# Patient Record
Sex: Female | Born: 1960
Health system: Southern US, Community
[De-identification: ages and names within clinical notes are randomized; demographics above are authoritative.]

## PROBLEM LIST (undated history)

## (undated) DIAGNOSIS — D329 Benign neoplasm of meninges, unspecified: Secondary | ICD-10-CM

## (undated) DIAGNOSIS — E7800 Pure hypercholesterolemia, unspecified: Secondary | ICD-10-CM

## (undated) DIAGNOSIS — E559 Vitamin D deficiency, unspecified: Secondary | ICD-10-CM

## (undated) DIAGNOSIS — R9409 Abnormal results of other function studies of central nervous system: Secondary | ICD-10-CM

## (undated) DIAGNOSIS — IMO0002 Reserved for concepts with insufficient information to code with codable children: Secondary | ICD-10-CM

## (undated) DIAGNOSIS — I10 Essential (primary) hypertension: Secondary | ICD-10-CM

## (undated) DIAGNOSIS — M8580 Other specified disorders of bone density and structure, unspecified site: Secondary | ICD-10-CM

## (undated) DIAGNOSIS — R030 Elevated blood-pressure reading, without diagnosis of hypertension: Secondary | ICD-10-CM

## (undated) DIAGNOSIS — J019 Acute sinusitis, unspecified: Secondary | ICD-10-CM

## (undated) DIAGNOSIS — F429 Obsessive-compulsive disorder, unspecified: Secondary | ICD-10-CM

## (undated) DIAGNOSIS — D352 Benign neoplasm of pituitary gland: Secondary | ICD-10-CM

## (undated) HISTORY — DX: Obsessive-compulsive disorder, unspecified: F42.9

## (undated) HISTORY — DX: Reserved for concepts with insufficient information to code with codable children: IMO0002

## (undated) HISTORY — DX: Acute sinusitis, unspecified: J01.90

## (undated) HISTORY — DX: Benign neoplasm of pituitary gland: D35.2

## (undated) HISTORY — DX: Pure hypercholesterolemia, unspecified: E78.00

## (undated) HISTORY — DX: Vitamin D deficiency, unspecified: E55.9

## (undated) HISTORY — DX: Benign neoplasm of meninges, unspecified: D32.9

## (undated) HISTORY — DX: Abnormal results of other function studies of central nervous system: R94.09

## (undated) HISTORY — PX: COLONOSCOPY: SHX174

## (undated) HISTORY — PX: OTHER SURGICAL HISTORY: SHX169

## (undated) HISTORY — DX: Elevated blood-pressure reading, without diagnosis of hypertension: R03.0

## (undated) HISTORY — DX: Other specified disorders of bone density and structure, unspecified site: M85.80

## (undated) HISTORY — DX: Essential (primary) hypertension: I10

---

## 1998-08-27 ENCOUNTER — Encounter: Payer: Self-pay | Admitting: Cardiovascular Disease

## 1998-08-27 ENCOUNTER — Ambulatory Visit (HOSPITAL_COMMUNITY): Admission: RE | Admit: 1998-08-27 | Discharge: 1998-08-27 | Payer: Self-pay | Admitting: Cardiovascular Disease

## 1998-09-17 ENCOUNTER — Other Ambulatory Visit: Admission: RE | Admit: 1998-09-17 | Discharge: 1998-09-17 | Payer: Self-pay | Admitting: Obstetrics & Gynecology

## 1999-08-30 ENCOUNTER — Ambulatory Visit (HOSPITAL_COMMUNITY): Admission: RE | Admit: 1999-08-30 | Discharge: 1999-08-30 | Payer: Self-pay | Admitting: Cardiovascular Disease

## 1999-08-30 ENCOUNTER — Encounter: Payer: Self-pay | Admitting: Cardiovascular Disease

## 2000-09-11 ENCOUNTER — Ambulatory Visit (HOSPITAL_COMMUNITY): Admission: RE | Admit: 2000-09-11 | Discharge: 2000-09-11 | Payer: Self-pay | Admitting: Cardiovascular Disease

## 2000-09-11 ENCOUNTER — Encounter: Payer: Self-pay | Admitting: Cardiovascular Disease

## 2001-07-27 ENCOUNTER — Other Ambulatory Visit: Admission: RE | Admit: 2001-07-27 | Discharge: 2001-07-27 | Payer: Self-pay | Admitting: Gynecology

## 2001-10-02 ENCOUNTER — Ambulatory Visit (HOSPITAL_COMMUNITY): Admission: RE | Admit: 2001-10-02 | Discharge: 2001-10-02 | Payer: Self-pay | Admitting: Cardiovascular Disease

## 2001-10-02 ENCOUNTER — Encounter: Payer: Self-pay | Admitting: Cardiovascular Disease

## 2002-08-30 ENCOUNTER — Other Ambulatory Visit: Admission: RE | Admit: 2002-08-30 | Discharge: 2002-08-30 | Payer: Self-pay | Admitting: Gynecology

## 2002-10-07 ENCOUNTER — Ambulatory Visit (HOSPITAL_COMMUNITY): Admission: RE | Admit: 2002-10-07 | Discharge: 2002-10-07 | Payer: Self-pay | Admitting: Gynecology

## 2002-10-07 ENCOUNTER — Encounter: Payer: Self-pay | Admitting: Gynecology

## 2003-04-25 ENCOUNTER — Encounter (INDEPENDENT_AMBULATORY_CARE_PROVIDER_SITE_OTHER): Payer: Self-pay | Admitting: Specialist

## 2003-04-25 ENCOUNTER — Ambulatory Visit (HOSPITAL_BASED_OUTPATIENT_CLINIC_OR_DEPARTMENT_OTHER): Admission: RE | Admit: 2003-04-25 | Discharge: 2003-04-25 | Payer: Self-pay | Admitting: Gynecology

## 2003-08-02 HISTORY — PX: POLYPECTOMY: SHX149

## 2003-08-02 HISTORY — PX: TUBAL LIGATION: SHX77

## 2004-03-08 ENCOUNTER — Ambulatory Visit (HOSPITAL_COMMUNITY): Admission: RE | Admit: 2004-03-08 | Discharge: 2004-03-08 | Payer: Self-pay | Admitting: Cardiovascular Disease

## 2004-11-15 ENCOUNTER — Other Ambulatory Visit: Admission: RE | Admit: 2004-11-15 | Discharge: 2004-11-15 | Payer: Self-pay | Admitting: Gynecology

## 2005-03-22 ENCOUNTER — Ambulatory Visit (HOSPITAL_COMMUNITY): Admission: RE | Admit: 2005-03-22 | Discharge: 2005-03-22 | Payer: Self-pay | Admitting: Gynecology

## 2005-08-01 DIAGNOSIS — D352 Benign neoplasm of pituitary gland: Secondary | ICD-10-CM

## 2005-08-01 HISTORY — DX: Benign neoplasm of pituitary gland: D35.2

## 2005-12-15 ENCOUNTER — Other Ambulatory Visit: Admission: RE | Admit: 2005-12-15 | Discharge: 2005-12-15 | Payer: Self-pay | Admitting: Gynecology

## 2006-04-10 ENCOUNTER — Ambulatory Visit (HOSPITAL_COMMUNITY): Admission: RE | Admit: 2006-04-10 | Discharge: 2006-04-10 | Payer: Self-pay | Admitting: Gynecology

## 2006-12-19 ENCOUNTER — Other Ambulatory Visit: Admission: RE | Admit: 2006-12-19 | Discharge: 2006-12-19 | Payer: Self-pay | Admitting: Gynecology

## 2007-05-15 ENCOUNTER — Ambulatory Visit (HOSPITAL_COMMUNITY): Admission: RE | Admit: 2007-05-15 | Discharge: 2007-05-15 | Payer: Self-pay | Admitting: Gynecology

## 2007-12-21 ENCOUNTER — Other Ambulatory Visit: Admission: RE | Admit: 2007-12-21 | Discharge: 2007-12-21 | Payer: Self-pay | Admitting: Gynecology

## 2008-05-05 ENCOUNTER — Ambulatory Visit: Payer: Self-pay | Admitting: Internal Medicine

## 2008-05-05 DIAGNOSIS — R030 Elevated blood-pressure reading, without diagnosis of hypertension: Secondary | ICD-10-CM

## 2008-05-05 DIAGNOSIS — R9409 Abnormal results of other function studies of central nervous system: Secondary | ICD-10-CM | POA: Insufficient documentation

## 2008-05-05 HISTORY — DX: Elevated blood-pressure reading, without diagnosis of hypertension: R03.0

## 2008-05-05 HISTORY — DX: Abnormal results of other function studies of central nervous system: R94.09

## 2008-05-07 ENCOUNTER — Ambulatory Visit: Payer: Self-pay | Admitting: Internal Medicine

## 2008-05-07 LAB — CONVERTED CEMR LAB
Bilirubin Urine: NEGATIVE
Glucose, Urine, Semiquant: NEGATIVE
Ketones, urine, test strip: NEGATIVE
Protein, U semiquant: NEGATIVE
Urobilinogen, UA: 0.2
pH: 6

## 2008-05-08 ENCOUNTER — Encounter: Payer: Self-pay | Admitting: Internal Medicine

## 2008-05-08 LAB — CONVERTED CEMR LAB: Vit D, 1,25-Dihydroxy: 24 — ABNORMAL LOW (ref 30–89)

## 2008-05-12 ENCOUNTER — Telehealth: Payer: Self-pay | Admitting: *Deleted

## 2008-05-14 LAB — CONVERTED CEMR LAB
Albumin: 3.6 g/dL (ref 3.5–5.2)
Basophils Absolute: 0 10*3/uL (ref 0.0–0.1)
Basophils Relative: 0.3 % (ref 0.0–3.0)
Bilirubin, Direct: 0.1 mg/dL (ref 0.0–0.3)
Calcium: 8.9 mg/dL (ref 8.4–10.5)
Cholesterol: 207 mg/dL (ref 0–200)
Creatinine, Ser: 0.6 mg/dL (ref 0.4–1.2)
Eosinophils Absolute: 0.1 10*3/uL (ref 0.0–0.7)
Eosinophils Relative: 1.7 % (ref 0.0–5.0)
GFR calc Af Amer: 137 mL/min
GFR calc non Af Amer: 113 mL/min
MCHC: 33.8 g/dL (ref 30.0–36.0)
MCV: 87.9 fL (ref 78.0–100.0)
Monocytes Absolute: 0.7 10*3/uL (ref 0.1–1.0)
Potassium: 4.5 meq/L (ref 3.5–5.1)
RDW: 12.7 % (ref 11.5–14.6)
VLDL: 15 mg/dL (ref 0–40)

## 2008-07-09 ENCOUNTER — Ambulatory Visit (HOSPITAL_COMMUNITY): Admission: RE | Admit: 2008-07-09 | Discharge: 2008-07-09 | Payer: Self-pay | Admitting: Internal Medicine

## 2008-07-10 ENCOUNTER — Telehealth: Payer: Self-pay | Admitting: Internal Medicine

## 2008-07-10 ENCOUNTER — Ambulatory Visit: Payer: Self-pay | Admitting: Family Medicine

## 2008-10-27 ENCOUNTER — Telehealth: Payer: Self-pay | Admitting: Internal Medicine

## 2008-12-22 ENCOUNTER — Encounter: Payer: Self-pay | Admitting: Gynecology

## 2008-12-22 ENCOUNTER — Other Ambulatory Visit: Admission: RE | Admit: 2008-12-22 | Discharge: 2008-12-22 | Payer: Self-pay | Admitting: Gynecology

## 2008-12-22 ENCOUNTER — Ambulatory Visit: Payer: Self-pay | Admitting: Gynecology

## 2009-01-20 ENCOUNTER — Ambulatory Visit: Payer: Self-pay | Admitting: Gynecology

## 2009-02-20 ENCOUNTER — Ambulatory Visit: Payer: Self-pay | Admitting: Gynecology

## 2009-07-15 ENCOUNTER — Ambulatory Visit: Payer: Self-pay | Admitting: Gynecology

## 2009-07-23 ENCOUNTER — Ambulatory Visit (HOSPITAL_COMMUNITY): Admission: RE | Admit: 2009-07-23 | Discharge: 2009-07-23 | Payer: Self-pay | Admitting: Gynecology

## 2009-08-04 ENCOUNTER — Ambulatory Visit: Payer: Self-pay | Admitting: Internal Medicine

## 2009-08-04 DIAGNOSIS — J019 Acute sinusitis, unspecified: Secondary | ICD-10-CM

## 2009-08-04 HISTORY — DX: Acute sinusitis, unspecified: J01.90

## 2009-09-18 ENCOUNTER — Ambulatory Visit: Payer: Self-pay | Admitting: Internal Medicine

## 2009-10-09 ENCOUNTER — Ambulatory Visit: Payer: Self-pay | Admitting: Internal Medicine

## 2009-10-30 ENCOUNTER — Telehealth: Payer: Self-pay | Admitting: *Deleted

## 2010-01-16 ENCOUNTER — Ambulatory Visit: Payer: Self-pay | Admitting: Family Medicine

## 2010-01-18 ENCOUNTER — Telehealth (INDEPENDENT_AMBULATORY_CARE_PROVIDER_SITE_OTHER): Payer: Self-pay | Admitting: *Deleted

## 2010-01-26 ENCOUNTER — Ambulatory Visit: Payer: Self-pay | Admitting: Gynecology

## 2010-01-26 ENCOUNTER — Other Ambulatory Visit: Admission: RE | Admit: 2010-01-26 | Discharge: 2010-01-26 | Payer: Self-pay | Admitting: Gynecology

## 2010-02-04 ENCOUNTER — Ambulatory Visit: Payer: Self-pay | Admitting: Gynecology

## 2010-07-27 ENCOUNTER — Ambulatory Visit
Admission: RE | Admit: 2010-07-27 | Discharge: 2010-07-27 | Payer: Self-pay | Source: Home / Self Care | Attending: Internal Medicine | Admitting: Internal Medicine

## 2010-07-27 DIAGNOSIS — H73019 Bullous myringitis, unspecified ear: Secondary | ICD-10-CM | POA: Insufficient documentation

## 2010-08-03 ENCOUNTER — Ambulatory Visit (HOSPITAL_COMMUNITY)
Admission: RE | Admit: 2010-08-03 | Discharge: 2010-08-03 | Payer: Self-pay | Source: Home / Self Care | Attending: Gynecology | Admitting: Gynecology

## 2010-08-13 ENCOUNTER — Encounter: Payer: Self-pay | Admitting: Internal Medicine

## 2010-08-13 ENCOUNTER — Ambulatory Visit
Admission: RE | Admit: 2010-08-13 | Discharge: 2010-08-13 | Payer: Self-pay | Source: Home / Self Care | Attending: Internal Medicine | Admitting: Internal Medicine

## 2010-08-22 ENCOUNTER — Encounter: Payer: Self-pay | Admitting: Cardiovascular Disease

## 2010-09-02 NOTE — Progress Notes (Signed)
Summary: malaria tabs  Phone Note Call from Patient   Caller: Husband Summary of Call: Pt is going to [place] for 4 weeks and would like to have malarone tabs called into [facility].  Initial call taken by: Romualdo Bolk, CMA (AAMA),  October 30, 2009 11:47 AM  Follow-up for Phone Call        Malarone (disp 37) take daily beginning 2 days before travel and continue for 7 days after return. Follow-up by: Madelin Headings MD,  November 02, 2009 11:11 AM  Additional Follow-up for Phone Call Additional follow up Details #1::        Rx sent to pharmacy. Additional Follow-up by: Romualdo Bolk, CMA (AAMA),  November 02, 2009 11:48 AM    New/Updated Medications: MALARONE 250-100 MG TABS (ATOVAQUONE-PROGUANIL HCL) 1 once daily 2 days prior to travel and continue for 7 days after travel Prescriptions: MALARONE 250-100 MG TABS (ATOVAQUONE-PROGUANIL HCL) 1 once daily 2 days prior to travel and continue for 7 days after travel  #37 x 0   Entered by:   Romualdo Bolk, CMA (AAMA)   Authorized by:   Madelin Headings MD   Signed by:   Romualdo Bolk, CMA (AAMA) on 11/02/2009   Method used:   Electronically to        [facility]  [phone]* (retail)       [address] [place], [place]  [address]       Ph: [phone] or [phone]       Fax: [phone]   RxID:   [id number]

## 2010-09-02 NOTE — Assessment & Plan Note (Signed)
Summary: SORE THROAT,COUGH,CONGESTION,H/A // RS   Vital Signs:  Patient profile:   50 year old female Menstrual status:  perimenopausal LMP:     05/18/2009 Height:      58.25 inches Weight:      152 pounds BMI:     31.61 Temp:     98.5 degrees F oral Pulse rate:   66 / minute BP sitting:   140 / 80  (right arm) Cuff size:   regular  Vitals Entered By: Romualdo Bolk, CMA (AAMA) (August 04, 2009 11:03 AM) CC: sore throat, body aches, coughing, congestion started on 1/2 and started with no voice today. No sob or wheezing. Pt is having alot of sinus pressure. Pt was on a cruise in [place]. LMP (date): 05/18/2009     Menstrual Status perimenopausal Enter LMP: 05/18/2009   History of Present Illness: [name] comesin today for  sda   with husband   because of acute illness that began 3 days ago ..just returned from cruise in [place] and husband had similar illness resolving using decongestants and  german med? homeopathic? Since last visit  here  there have been no major changes in health status  .    last visit was  december 2009 .  Fever low grade at onset.  No chest pain and no SOB.  now head congestion and clogged ears  some cough .  No NVD.   Preventive Screening-Counseling & Management  Alcohol-Tobacco     Alcohol drinks/day: <1     Smoking Status: never  Caffeine-Diet-Exercise     Caffeine use/day: 2     Does Patient Exercise: no  Current Medications (verified): 1)  None  Allergies (verified): 1)  ! Asa  Past History:  Past medical, surgical, family and social histories (including risk factors) reviewed for relevance to current acute and chronic problems.  Past Medical History: Reviewed history from 05/05/2008 and no changes required. Unremarkable G2P2  remote hairline fracture  leg .  years ago.  Hepatitis  as a teen ? A   in [place]  ? if had Hep b  vaccine.   Chicken pox as a child   CONSULTANTS  Fernandex gyne Nudelman  NS   Past Surgical  History: Reviewed history from 05/05/2008 and no changes required. Tubal ligation C-Section x 2 ovarian cyst removal  ?  2005.    Past History:  Care Management: Gynecology: Beatrix Shipper  Family History: Reviewed history from 05/05/2008 and no changes required. parents 42 and 97  in [place].     mom has faultly valve .   sib siter  16  a and well  Social History: Reviewed history from 05/05/2008 and no changes required. 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    Physical Exam  General:  Well-developed,well-nourished,in no acute distress; alert,appropriate and cooperative throughout examination very congested  in nose  Head:  normocephalic, atraumatic, and no abnormalities observed.   Eyes:  vision grossly intact, pupils equal, and pupils round.   Ears:  tms lm normal  but pink  shiny  otherwise  Nose:  no external deformity and no external erythema.  3+ turbinates  Mouth:  pharynx pink and moist.   Neck:  No deformities, masses, or tenderness noted. Lungs:  Normal respiratory effort, chest expands symmetrically. Lungs are clear to auscultation, no crackles or wheezes.no dullness.   Heart:  Normal rate and regular rhythm. S1 and S2 normal without gallop, murmur, click, rub or other extra sounds. Cervical Nodes:  No lymphadenopathy noted Psych:  Oriented X3, not anxious appearing, and not depressed appearing.     Impression & Recommendations:  Problem # 1:  SINUSITIS - ACUTE-NOS (ICD-461.9) seems viral     at this point disc   course of   illness .  Expectant management and symptom  treatment.   Problem # 2:  URI (ICD-465.9) see above  The following medications were removed from the medication list:    Diclofenac Sodium 75 Mg Tbec (Diclofenac sodium) .Marland Kitchen... 1 two times a day  for inflammation  Patient Instructions: 1)  Mucinex D (  decongestant   medication) for congestion  2)  ACan use afrin generic nose spray at night for 3 days  to relieve the sinus pressure. 3)  Acute sinusitis symptoms for less than 10 days are not helped by antibiotics. Use warm moist compresses, and over the counter decongestants( only as directed). Call if no improvement in 5-7 days, sooner if increasing pain, fever, or new symptoms.  4)  Expect  illness to last  about a week but should be better after the weekend . 5)  Call if fever shortness of breath of severe pain.

## 2010-09-02 NOTE — Assessment & Plan Note (Signed)
Summary: NOT FEELING//CCM   Vital Signs:  Patient profile:   50 year old female Menstrual status:  perimenopausal Weight:      151 pounds Temp:     98.8 degrees F oral Pulse rate:   66 / minute BP sitting:   120 / 80  (right arm) Cuff size:   regular  Vitals Entered By: Romualdo Bolk, CMA (AAMA) (October 09, 2009 8:34 AM) CC: Pt is still got some coughing and congestion. But not has much as last time.   History of Present Illness: Ziyah Gillooly comein today for    follow up of prolonged respiratory problems  . LAst she was rx with antibiotic and nasal steroids . She is about 75 % better  but still has am cough with some yellow drainage.  onmaris has helped the snoring . No face pain HA fever or sob.    No reflux signs .  No HA or cp sob or wheezing.    feels well otherwise.  Preventive Screening-Counseling & Management  Alcohol-Tobacco     Alcohol drinks/day: <1     Smoking Status: never  Caffeine-Diet-Exercise     Caffeine use/day: 2     Does Patient Exercise: no  Current Medications (verified): 1)  Omnaris 50 Mcg/act Susp (Ciclesonide) .... 2 Sprays Each Nostril Each Day  Allergies (verified): 1)  ! Asa  Past History:  Past medical, surgical, family and social histories (including risk factors) reviewed for relevance to current acute and chronic problems.  Past Medical History: Reviewed history from 05/05/2008 and no changes required. Unremarkable G2P2  remote hairline fracture  leg .  years ago.  Hepatitis  as a teen ? A   in [place]  ? if had Hep b  vaccine.   Chicken pox as a child   CONSULTANTS  Fernandex gyne Nudelman  NS   Past Surgical History: Reviewed history from 05/05/2008 and no changes required. Tubal ligation C-Section x 2 ovarian cyst removal  ?  2005.    Past History:  Care Management: Gynecology: Beatrix Shipper  Family History: Reviewed history from 05/05/2008 and no changes required. parents 10 and 97  in [place].     mom has faultly  valve .   sib siter  41  a and well  Social History: Reviewed history from 05/05/2008 and no changes required. 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  The patient denies anorexia, fever, weight loss, weight gain, vision loss, decreased hearing, chest pain, syncope, dyspnea on exertion, peripheral edema, headaches, hemoptysis, abdominal pain, abnormal bleeding, enlarged lymph nodes, and angioedema.         ? some hoarseness at times  Physical Exam  General:  Well-developed,well-nourished,in no acute distress; alert,appropriate and cooperative throughout examination Head:  normocephalic, atraumatic, and no abnormalities observed.   Eyes:  vision grossly intact, pupils equal, and pupils round. no redness or discharge    Ears:  R ear normal and L ear normal.   Nose:  no external deformity, no external erythema, and no nasal discharge.  mild congestion faace non tender  Mouth:  pharynx pink and moist.   Neck:  No deformities, masses, or tenderness noted. Lungs:  Normal respiratory effort, chest expands symmetrically. Lungs  are clear to auscultation, no crackles or wheezes.no dullness.   Heart:  normal rate, regular rhythm, no rub, and no lifts.   Pulses:  nl cap refill  Neurologic:  alert & oriented X3 and gait normal.   Skin:  turgor normal, color normal, and no rashes.   Cervical Nodes:  No lymphadenopathy noted Psych:  Oriented X3, normally interactive, and good eye contact.     Impression & Recommendations:  Problem # 1:  COUGH (ICD-786.2) Assessment Improved still am  poss Post nasal drainage     or other  sinusitis   everything is much better however after nasal steroid and antibiotic   will add short course of steroid oral and  empiric prilosec for poss elr .    if not better  consider sinus ct    other antihistamine referral as appropiate.  Problem # 2:  UNSPECIFIED SINUSITIS (ICD-473.9) Assessment: Improved  The following medications were removed from the medication list:    Amoxicillin-pot Clavulanate 875-125 Mg Tabs (Amoxicillin-pot clavulanate) .Marland Kitchen... 1 by mouth two times a day for 10 days. Her updated medication list for this problem includes:    Omnaris 50 Mcg/act Susp (Ciclesonide) .Marland Kitchen... 2 sprays each nostril each day  Complete Medication List: 1)  Omnaris 50 Mcg/act Susp (Ciclesonide) .... 2 sprays each nostril each day 2)  Prednisone 20 Mg Tabs (Prednisone) .... 3 po for first day    then 2 po  each  day for 4 days or as directed  Patient Instructions: 1)  take a short trial of prednisone and begin prilosec 20 mg once daily over the counter  for the next 3 weeks  2)  continue the omnaris  nose spray. 3)  callin 1-2 weeks about how this is doing   4)  If not continuing to get better . Consider   sinus  check and other treatments.  Prescriptions: PREDNISONE 20 MG TABS (PREDNISONE) 3 po for first day    then 2 po  each  day for 4 days or as directed  #15 x 0   Entered and Authorized by:   Madelin Headings MD   Signed by:   Madelin Headings MD on 10/09/2009   Method used:   [facility] to        [facility]  [phone]* (retail)       [address] [place], [place]  [address]       Ph: [phone] or [phone]       Fax: [phone]   RxID:   [id number]

## 2010-09-02 NOTE — Assessment & Plan Note (Signed)
Summary: fever/cough/chest congestion/sorethroat/cjr   Vital Signs:  Patient profile:   50 year old female Menstrual status:  perimenopausal Height:      58.25 inches Weight:      148 pounds BMI:     30.78 Temp:     98.6 degrees F oral Pulse rate:   88 / minute BP sitting:   120 / 80  (right arm) Cuff size:   regular  Vitals Entered By: Romualdo Bolk, CMA (AAMA) (July 27, 2010 11:32 AM) CC: Coughing, congestion, sore throat when swallowing, fever around 100. This started on 12/14. No SOB, No Wheezing   History of Present Illness: [name] comes in today  with husband because of above. had cough an uri at thanksgiving and resolved and then 4 days ago had sorecongestion and sever tsore throat now and inc snoring and cough.    ears are clogged and hard to hear.    Face non tender  feels achy.  Daughter to have engagement party this weekend and is concerned with getting better  and her illness.    Gets  allergic rhinitis    takes zyrtec  causes drowsiness.   Preventive Screening-Counseling & Management  Alcohol-Tobacco     Alcohol drinks/day: <1     Smoking Status: never  Caffeine-Diet-Exercise     Caffeine use/day: 2     Does Patient Exercise: no  Current Medications (verified): 1)  Omnaris 50 Mcg/act Susp (Ciclesonide) .... 2 Sprays Each Nostril Each Day 2)  Hydromet 5-1.5 Mg/70ml Syrp (Hydrocodone-Homatropine) .Marland Kitchen.. 1 or 2 Tsps Three Times A Day Prn  Allergies (verified): 1)  ! Asa  Past History:  Past medical, surgical, family and social histories (including risk factors) reviewed, and no changes noted (except as noted below).  Past Medical History: Unremarkable G2P2  remote hairline fracture  leg .  years ago.  Hepatitis  as a teen ? A   in [place]  ? if had Hep b  vaccine.   Chicken pox as a child ? allergic rhinitis    CONSULTANTS  Fernandex gyne [place]  NS   Past Surgical History: Reviewed history from 05/05/2008 and no changes  required. Tubal ligation C-Section x 2 ovarian cyst removal  ?  2005.    Past History:  Care Management: Gynecology: Beatrix Shipper  Family History: Reviewed history from 05/05/2008 and no changes required. parents 71 and 97  in [place].     mom has faultly valve .   sib siter  3  a and well  Social History: Reviewed history from 05/05/2008 and no changes required. 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  The patient denies weight loss, chest pain, peripheral edema, hemoptysis, abdominal pain, abnormal bleeding, and angioedema.         snoring    Physical Exam  General:  congested in nad   has throat pain Head:  normocephalic and atraumatic.   Eyes:  PERRL, EOMs full, conjunctiva clear  Ears:  R ear normal.  left tm with large bleb and yellow fluid otherwise nl lms  Nose:  no external deformity and no external erythema.  3 + turbinates  mucoid dc  Mouth:  2+ red and midledema  airway patents  positive cobblesstoning  face non tender Neck:  supple.  tender ac nodes neg pc nodes  Lungs:  Normal respiratory effort, chest expands symmetrically. Lungs are clear to auscultation, no crackles or wheezes.no dullness.   Heart:  Normal rate and regular rhythm. S1 and S2 normal without gallop, murmur, click, rub or other extra sounds. Skin:  turgor normal, color normal, no ecchymoses, and no petechiae.   Cervical Nodes:  no posterior cervical adenopathy.  tender ac nodfes  Psych:  Oriented X3, good eye contact, not anxious appearing, and not depressed appearing.     Impression & Recommendations:  Problem # 1:  SINUSITIS - ACUTE-NOS (ICD-461.9) with acute pharyyngitis    ...uri could be viral  but  because of severity of symptoms and ears  can empirically rx as  before .    doubt atypicals  Her updated medication list for this problem includes:    Omnaris 50 Mcg/act Susp (Ciclesonide) .Marland Kitchen... 2 sprays each nostril each day    Hydromet 5-1.5 Mg/71ml Syrp (Hydrocodone-homatropine) .Marland Kitchen... 1 or 2 tsps  a day as needed   qid    Amoxicillin 875 Mg Tabs (Amoxicillin) .Marland Kitchen... 1 by mouth three times a day for sinusitis  Problem # 2:  BULLOUS MYRINGITIS (ICD-384.01) left   .Marland KitchenMarland KitchenExpectant management   Complete Medication List: 1)  Omnaris 50 Mcg/act Susp (Ciclesonide) .... 2 sprays each nostril each day 2)  Hydromet 5-1.5 Mg/80ml Syrp (Hydrocodone-homatropine) .Marland Kitchen.. 1 or 2 tsps  a day as needed   qid 3)  Amoxicillin 875 Mg Tabs (Amoxicillin) .Marland Kitchen.. 1 by mouth three times a day for sinusitis  Patient Instructions: 1)  Add saline nose spray  and can try afrin for 2-3 nights to openup nasal passages. 2)  antibiotic  will help a bacterial sinusitis and  strep throat. 3)  if fever last s more than 2-3 days then call . 4)  Other wise if this is a  viral respiratory infection  the cough may last a few weeks.  Prescriptions: HYDROMET 5-1.5 MG/5ML SYRP (HYDROCODONE-HOMATROPINE) 1 or 2 tsps  a day as needed   qid  #6 oz x 0   Entered and Authorized by:   Madelin Headings MD   Signed by:   Madelin Headings MD on 07/27/2010   Method used:   Print then Give to Patient   RxID:   [id number] AMOXICILLIN 875 MG TABS (AMOXICILLIN) 1 by mouth three times a day for sinusitis  #30 x 0   Entered and Authorized by:   Madelin Headings MD   Signed by:   Madelin Headings MD on 07/27/2010   Method used:   Electronically to        [facility]  [phone]* (retail)       [address] [place], [place]  [address]       Ph: [phone] or [phone]       Fax: [phone]   RxID:   [id number]    Orders Added: 1)  Est. Patient Level IV [87564]

## 2010-09-02 NOTE — Progress Notes (Signed)
Summary: Call-A-Nurse Report    Call-A-Nurse Triage Call Report Triage Record Num: [id number] Operator: Elita Boone Patient Name: [name] Call Date & Time: 01/16/2010 8:02:27AM Patient Phone: [phone] PCP: Neta Mends. Panosh Patient Gender: Female PCP Fax : [phone] Patient DOB: [birth year] Practice Name: [facility] Reason for Call: Husband called to report that pt has a cough and fever 101.5 (O). Onset several days ago. Been on extended trip to [place] and have just returned. Pt is coughing yellow mucus. Home care advice given for cough. Pt instructed to call for appt when office is open. No emergent s/s Protocol(s) Used: Cough - Adult Recommended Outcome per Protocol: See Provider within 4 hours Reason for Outcome: New onset or worsening cough AND temperature of 101.5 F (38.6C) or greater Care Advice:  ~ Another adult should drive.  ~ Call provider if symptoms worsen or new symptoms develop.  ~ List, or take, all current prescription(s), OTC or alternative medication(s) to provider for evaluation. During pregnancy, call provider if temperature is 100 F (37.7 C) or greater OR any temperature elevation for 3 days even while taking acetaminophen.  ~  ~ Be sure to tell each provider you have contact with that you have or may have been exposed to SARS. Consider acetaminophen as directed on label or by pharmacist/provider for pain or fever PRECAUTIONS: - If there is no history of liver disease, alcoholism, or intake of three or more alcohol drinks per day - If approved by provider during pregnancy or when breastfeeding - During pregnancy, acetaminophen should not be taken more than 3 consecutive days without telling provider - Do not exceed recommended dose or frequency  ~ Drink 6-10 eight ounce glasses (1.2-2.0 liters) of fluids per day unless previously instructed to restrict fluid intake for other medical reasons. Limit fluids that contain caffeine, sugar or  alcohol.  ~ Systemic Inflammatory Response Syndrome (SIRS): Watch for signs of a generalized, whole body infection. Occurs within days of a localized infection, especially of the urinary, GI, respiratory or nervous systems; or after a traumatic injury or invasive procedure. - Call EMS 911 if symptoms have worsened, such as increasing confusion or unusual drowsiness; cold and clammy skin; no urine output; rapid respiration (>30/min.) or slow respiration (<10/min.); struggling to breathe. - Go to the ED immediately for early symptoms of rapid pulse >90/min. or rapid breathing >20/min. at rest; chills; oral temperature >100.4 F (38 C) or <96.8 F (36 C) when associated with conditions noted.  ~  ~ SYMPTOM / CONDITION MANAGEMENT  ~ CAUTIONS 01/16/2010 8:13:13AM Page 1 of 1 [id number]

## 2010-09-02 NOTE — Assessment & Plan Note (Signed)
Summary: SORE THROAT//CCM   Vital Signs:  Patient profile:   50 year old female Menstrual status:  perimenopausal Weight:      149 pounds Temp:     98.2 degrees F oral Pulse rate:   84 / minute BP sitting:   138 / 80  (right arm)  Vitals Entered By: Kyung Rudd, CMA (August 13, 2010 10:33 AM) CC: pt c/o ST and drainage with cough...notes that after 10 course of abx ended, 2 days later sx resurfaced   CC:  pt c/o ST and drainage with cough...notes that after 10 course of abx ended and 2 days later sx resurfaced.  History of Present Illness: Patient presents to clinic as a workin for evaluation of sore throat. Notes 2d h/o ST without chest congestion, fever or chills.+np cough. Recently treated 12/27 for URI with amoxicillin 10d. States resolved all symptoms before developed recent ST. No difficulty swallowing. No sick exposures. No alleviating or exacerbating factors.   Current Medications (verified): 1)  Omnaris 50 Mcg/act Susp (Ciclesonide) .... 2 Sprays Each Nostril Each Day  Allergies (verified): 1)  ! Asa  Past History:  Past medical, surgical, family and social histories (including risk factors) reviewed, and no changes noted (except as noted below).  Past Medical History: Reviewed history from 07/27/2010 and no changes required. Unremarkable G2P2  remote hairline fracture  leg .  years ago.  Hepatitis  as a teen ? A   in [place]  ? if had Hep b  vaccine.   Chicken pox as a child ? allergic rhinitis    CONSULTANTS  Fernandex gyne [place]  NS   Past Surgical History: Reviewed history from 05/05/2008 and no changes required. Tubal ligation C-Section x 2 ovarian cyst removal  ?  2005.    Family History: Reviewed history from 05/05/2008 and no changes required. parents 58 and 97  in [place].     mom has faultly valve .   sib siter  50  a and well  Social History: Reviewed history from 07/27/2010 and no changes required. 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 Eyes:  Denies discharge, eye irritation, eye pain, and red eye. ENT:  Complains of sore throat; denies difficulty swallowing, ear discharge, earache, hoarseness, and nasal congestion. Resp:  Complains of cough; denies chest discomfort, coughing up blood, shortness of breath, sputum productive, and wheezing.  Physical Exam  General:  Well-developed,well-nourished,in no acute distress; alert,appropriate and cooperative throughout examination Head:  Normocephalic and atraumatic without obvious abnormalities. No apparent alopecia or balding. Eyes:  pupils equal, pupils round, corneas and lenses clear, and no injection.   Ears:  External ear exam shows no significant lesions or deformities.  Otoscopic examination reveals clear canals, tympanic membranes are intact bilaterally without bulging, retraction, inflammation or discharge. Hearing is grossly normal bilaterally. Nose:  External nasal examination shows no deformity or inflammation. Nasal mucosa are pink and moist without lesions or exudates. Mouth:  Bilateral posterior erythema without exudate.no posterior lymphoid hypertrophy and no lesions.   Skin:  turgor normal, color normal, and no rashes.     Impression & Recommendations:  Problem # 1:  ACUTE PHARYNGITIS (ICD-462) Assessment New Rapid strep obtained and reviewed as neg. Obtain throat culture. Recommend otc symptomatic tx.  Followup if no improvement or worsening.   Orders: Rapid Strep (13086) T-Culture, Throat (57846-96295) Specimen Handling (28413)  Complete Medication List: 1)  Omnaris 50 Mcg/act Susp (Ciclesonide) .... 2 sprays each nostril each day 2)  Hydromet 5-1.5 Mg/79ml Syrp (Hydrocodone-homatropine) .Marland Kitchen.. 1-2 tsps by mouth three times a day as needed  cough  Other Orders: Flu Vaccine 103yrs + (24401) Admin 1st Vaccine (02725)  Prescriptions: HYDROMET 5-1.5 MG/5ML SYRP (HYDROCODONE-HOMATROPINE) 1-2 tsps by mouth three times a day as needed cough  #8 ounces x 0   Entered and Authorized by:   Edwyna Perfect MD   Signed by:   Edwyna Perfect MD on 08/13/2010   Method used:   Print then Give to Patient   RxID:   [id number]    Orders Added: 1)  Rapid Strep [87564] 2)  T-Culture, Throat [33295-18841] 3)  Flu Vaccine 14yrs + [66063] 4)  Admin 1st Vaccine [90471] 5)  Specimen Handling [99000] 6)  Est. Patient Level III [01601]   Immunizations Administered:  Influenza Vaccine # 1:    Vaccine Type: Fluvax 3+    Site: left deltoid    Mfr: fluarix    Dose: 0.5 ml    Route: IM    Given by: Kyung Rudd, CMA    Exp. Date: 01/29/2011    Lot #: [id number]   Immunizations Administered:  Influenza Vaccine # 1:    Vaccine Type: Fluvax 3+    Site: left deltoid    Mfr: fluarix    Dose: 0.5 ml    Route: IM    Given by: Kyung Rudd, CMA    Exp. Date: 01/29/2011    Lot #: [id number]

## 2010-09-02 NOTE — Assessment & Plan Note (Signed)
Summary: FEVER ,CONGESTION/VFW   Vital Signs:  Patient profile:   50 year old female Menstrual status:  perimenopausal Weight:      153 pounds Temp:     98.1 degrees F oral Pulse rate:   102 / minute BP sitting:   126 / 80  (left arm)  Vitals Entered By: Doristine Devoid (January 16, 2010 10:42 AM) CC: fever up to 101 chest congestion and cough   CC:  fever up to 101 chest congestion and cough.  History of Present Illness: 50 y/o fem w  ond days h/o of fever and cough..just flew in from [place] req. a atb  Allergies: 1)  ! Asa  Past History:  Past medical, surgical, family and social histories (including risk factors) reviewed for relevance to current acute and chronic problems.  Past Medical History: Reviewed history from 05/05/2008 and no changes required. Unremarkable G2P2  remote hairline fracture  leg .  years ago.  Hepatitis  as a teen ? A   in [place]  ? if had Hep b  vaccine.   Chicken pox as a child   CONSULTANTS  Fernandex gyne [place]  NS   Past Surgical History: Reviewed history from 05/05/2008 and no changes required. Tubal ligation C-Section x 2 ovarian cyst removal  ?  2005.    Family History: Reviewed history from 05/05/2008 and no changes required. parents 77 and 97  in [place].     mom has faultly valve .   sib siter  89  a and well  Social History: Reviewed history from 05/05/2008 and no changes required. 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 No apparent alopecia or balding. Eyes:  No corneal or conjunctival inflammation noted. EOMI. Perrla. Funduscopic exam benign, without  hemorrhages, exudates or papilledema. Vision grossly normal. Ears:  External ear exam shows no significant lesions or deformities.  Otoscopic examination reveals clear canals, tympanic membranes are intact bilaterally without bulging, retraction, inflammation or discharge. Hearing is grossly normal bilaterally. Nose:  External nasal examination shows no deformity or inflammation. Nasal mucosa are pink and moist without lesions or exudates. Mouth:  Oral mucosa and oropharynx without lesions or exudates.  Teeth in good repair. Neck:  No deformities, masses, or tenderness noted. Lungs:  Normal respiratory effort, chest expands symmetrically. Lungs are clear to auscultation, no crackles or wheezes.   Problems:  Medical Problems Added: 1)  Dx of Viral Infection-unspec  (ICD-079.99)  Impression & Recommendations:  Problem # 1:  VIRAL INFECTION-UNSPEC (ICD-079.99) Assessment New  Her updated medication list for this problem includes:    Hydromet 5-1.5 Mg/27ml Syrp (Hydrocodone-homatropine) .Marland Kitchen... 1 or 2 tsps three times a day prn  Complete Medication List: 1)  Omnaris 50 Mcg/act Susp (Ciclesonide) .... 2 sprays each nostril each day 2)  Prednisone 20 Mg Tabs (Prednisone) .... 3 po for first day    then 2 po  each  day for 4 days or as directed 3)  Malarone 250-100 Mg Tabs (Atovaquone-proguanil hcl) .Marland Kitchen.. 1 once daily 2 days prior to travel and continue for 7 days after travel 4)  Hydromet 5-1.5 Mg/54ml Syrp (  Hydrocodone-homatropine) .Marland Kitchen.. 1 or 2 tsps three times a day prn  Patient Instructions: 1)  Get plenty of rest, drink lots of clear liquids, and use Tylenol or Ibuprofen for fever and comfort. Return in 7-10 days if you're not better:sooner if you're feeling worse. 2)  Take 650-1000mg  of Tylenol every 4-6 hours as needed for relief of pain or comfort of fever AVOID taking more than 4000mg   in a 24 hour period (can cause liver damage in higher doses). 3)  hydromet 1 or 2 tsps three times a day  prn Prescriptions: HYDROMET 5-1.5 MG/5ML SYRP (HYDROCODONE-HOMATROPINE) 1 or 2 tsps three times a day prn  #8oz x 0   Entered and Authorized by:   Roderick Pee MD   Signed by:   Roderick Pee MD on 01/16/2010   Method used:   Print then Give to Patient   RxID:   [id number]

## 2010-09-02 NOTE — Assessment & Plan Note (Signed)
Summary: cough/congestion/cjr   Vital Signs:  Patient profile:   50 year old female Menstrual status:  perimenopausal LMP:     05/01/2009 Weight:      151 pounds Temp:     98.1 degrees F oral Pulse rate:   60 / minute BP sitting:   120 / 80  (right arm) Cuff size:   regular  Vitals Entered By: Romualdo Bolk, CMA (AAMA) (September 18, 2009 8:28 AM) CC: Coughing hasn't gone away since Jan. Snoring alot. Pt is also starting to lose her voice. Pt has notice some congestion in her throat. LMP (date): 05/01/2009     Enter LMP: 05/01/2009   History of Present Illness: [name] comes in today for    above.  accompanied by husband. Still has  throat    clearing and hoarseness at the end of the day and thick green yellow phlegfm in the am.  no cp sob or wheezing   . No fever  no hemoptysis.   Some increase in snoring as per husband .   traveled to [place]  feb 4th  . No new exposures.    No sneezing itching  fever . She says that ever since  having wisdom surgery 2009 she has had some type of fullness or abnormal feeling on left maxillary area and nostril but no pain  or obstruction.   Preventive Screening-Counseling & Management  Alcohol-Tobacco     Alcohol drinks/day: <1     Smoking Status: never  Caffeine-Diet-Exercise     Caffeine use/day: 2     Does Patient Exercise: no  Current Medications (verified): 1)  None  Allergies (verified): 1)  ! Asa  Past History:  Past medical, surgical, family and social histories (including risk factors) reviewed for relevance to current acute and chronic problems.  Past Medical History: Reviewed history from 05/05/2008 and no changes required. Unremarkable G2P2  remote hairline fracture  leg .  years ago.  Hepatitis  as a teen ? A   in [place]  ? if had Hep b  vaccine.   Chicken pox as a child   CONSULTANTS  Fernandex gyne [place]  NS   Past Surgical History: Reviewed history from 05/05/2008 and no changes  required. Tubal ligation C-Section x 2 ovarian cyst removal  ?  2005.    Family History: Reviewed history from 05/05/2008 and no changes required. parents 29 and 97  in [place].     mom has faultly valve .   sib siter  69  a and well  Social History: Reviewed history from 05/05/2008 and no changes required. 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  The patient denies anorexia, fever, weight loss, weight gain, vision loss, decreased hearing, chest pain, syncope, dyspnea on exertion, peripheral edema, headaches, abdominal pain, melena, hematochezia, severe indigestion/heartburn, abnormal bleeding, enlarged lymph nodes, and angioedema.    Physical Exam  General:  Well-developed,well-nourished,in no acute distress; alert,appropriate and cooperative throughout examination Head:  normocephalic and atraumatic.   Eyes:  vision grossly intact, pupils equal, and pupils round.   Ears:  R ear normal, L ear normal, and no external deformities.   Nose:  no external deformity.   Mouth:  good dentition.  Neck:  No deformities, masses, or tenderness noted. Lungs:  Normal respiratory effort, chest expands symmetrically. Lungs are clear to auscultation, no crackles or wheezes.no dullness.   Heart:  Normal rate and regular rhythm. S1 and S2 normal without gallop, murmur, click, rub or other extra sounds.no lifts.   Pulses:  pulses intact without delay   Neurologic:  non focal  Skin:  turgor normal, color normal, no ecchymoses, and no petechiae.   Cervical Nodes:  No lymphadenopathy noted   Impression & Recommendations:  Problem # 1:  UNSPECIFIED SINUSITIS (ICD-473.9)  this seems to be residual from her viral resp infection  adn involving  post nasal drainage .   prolonged  symptom over a month Her  updated medication list for this problem includes:    Amoxicillin-pot Clavulanate 875-125 Mg Tabs (Amoxicillin-pot clavulanate) .Marland Kitchen... 1 by mouth two times a day for 10 days.    Omnaris 50 Mcg/act Susp (Ciclesonide) .Marland Kitchen... 2 sprays each nostril each day  Orders: Prescription Created Electronically [phone])  Complete Medication List: 1)  Amoxicillin-pot Clavulanate 875-125 Mg Tabs (Amoxicillin-pot clavulanate) .Marland Kitchen.. 1 by mouth two times a day for 10 days. 2)  Omnaris 50 Mcg/act Susp (Ciclesonide) .... 2 sprays each nostril each day  Patient Instructions: 1)  take antibioitc and nasal steroids   2)  callin 2 weeks if you are not better . Prescriptions: OMNARIS 50 MCG/ACT SUSP (CICLESONIDE) 2 sprays each nostril each day  #1 x 3   Entered and Authorized by:   Madelin Headings MD   Signed by:   Madelin Headings MD on 09/18/2009   Method used:   Print then Give to Patient   RxID:   [id number] AMOXICILLIN-POT CLAVULANATE 875-125 MG TABS (AMOXICILLIN-POT CLAVULANATE) 1 by mouth two times a day for 10 days.  #20 x 0   Entered and Authorized by:   Madelin Headings MD   Signed by:   Madelin Headings MD on 09/18/2009   Method used:   Electronically to        [facility]  [phone]* (retail)       [address] [place], [place]  [address]       Ph: [phone] or [phone]       Fax: [phone]   RxID:   [id number]

## 2010-11-01 LAB — COMPREHENSIVE METABOLIC PANEL
ALT: 22 U/L (ref 0–35)
AST: 26 U/L (ref 0–37)
Alkaline Phosphatase: 80 U/L (ref 39–117)
BUN: 11 mg/dL (ref 6–23)
Creatinine, Ser: 0.66 mg/dL (ref 0.4–1.2)
GFR calc Af Amer: >60 mL/min (ref >60)
GFR calc non Af Amer: >60 mL/min (ref >60)
Sodium: 140 mEq/L (ref 135–145)
Total Bilirubin: 0.4 mg/dL (ref 0.3–1.2)
Total Protein: 7 g/dL (ref 6.0–8.3)

## 2010-12-17 NOTE — Op Note (Signed)
[name], [name]                         ACCOUNT NO.:  [id number]   MEDICAL RECORD NO.:  [id number]                   PATIENT TYPE:  AMB   LOCATION:  [facility]                                 FACILITY:  [facility]   PHYSICIAN:  Juan H. Lily Peer, M.D.             DATE OF BIRTH:  Jun 27, 1961   DATE OF PROCEDURE:  04/25/2003  DATE OF DISCHARGE:                                 OPERATIVE REPORT   SURGEON:  Juan H. Lily Peer, M.D.   INDICATIONS FOR PROCEDURE:  A 50 year old, gravida 2, para 2 with history of  dysfunctional uterine bleeding. Workup had demonstrated an endometrial  polyp. The patient also requested elective permanent sterilization for which  she was counselled and literature information had been provided. The patient  is fully aware that this is a permanent sterilization procedure.   PREOPERATIVE DIAGNOSES:  1. Request for elective permanent sterilization.  2. Endometrial polyp.  3. Umbilical lesion.   POSTOPERATIVE DIAGNOSES:  1. Request for elective permanent sterilization.  2. Endometrial polyp.  3. Umbilical lesion.   ANESTHESIA:  General endotracheal anesthesia.   PROCEDURE:  1. Laparoscopic tubal ligation Hulka clip technique.  2. Excision of umbilical mole.  3. Diagnostic hysteroscopy.  4. Resectoscopic polypectomy.   FINDINGS:  Normal uterus, tubes and ovaries. The patient also was noted to  have a fungating mole like lesion on the umbilicus and also intrauterine  cavity demonstrated a small polyp in the right lower uterine segment,  otherwise, normal intracervical canal. Both tubal ostia were identified.   DESCRIPTION OF PROCEDURE:  After the patient was adequately counselled, she  was taken to the operating room where she underwent successful general  endotracheal anesthesia. She was placed in low lithotomy position. The  abdomen, vagina, and perineum were prepped and draped in the usual sterile  fashion. The patient preoperatively had received a gram of  Cefotan. A red  rubber Roxan Hockey was infused into the bladder to evacuate its contents for  approximately 75 mL. After the drapes were in place and after the Hulka  tenaculum had been placed, of note laminaria that was placed the day before  was removed today in an effort to facilitate insertion of the hysteroscope  at the time of the second portion of the operation. After the drapes were in  place, a small umbilical incision was made followed insertion of the Veress  needle. The two edge retractor was used for exposure to identify the fascia,  a small nick was made in the fascia and the Veress needle was introduced and  opening intraabdominal pressure was approximately 5 mm and approximately 2  1/2 liters of carbon dioxide were insufflated through the peritoneal cavity.  The 10/11 trocar was then inserted, the sleeve left in place and the  laparoscope was inserted. A second puncture site was made 2 cm above the  symphysis pubis whereby a 5 mm trocar was inserted and inspection of the  entire pelvic cavity demonstrated normal uterus, tubes and ovaries and cul-  de-sac anterior and posterior were free of adhesions or endometriosis. With  a self retaining grasper, the right fallopian tube was placed into tension,  the proximal 1/3 portion was identified and a Hulka tenaculum was placed in  a segment that occluded the entire tube for that small portion consistent  with the size of the clip. A similar procedure was carried down the  contralateral side, pre and post pictures were obtained. Both instruments  were removed after the carbon dioxide was removed from the peritoneal  cavity. The fascia was closed with a figure-of-eight suture of #0 Vicryl  suture and the subcutaneous skin was reapproximated with 4-0 Monocryl. The  umbilical incision was excised with a scalpel, passed off the operative  field for histological evaluation. The edges of the skin reapproximated with  a running stitch of 4-0  Monocryl suture. The 5 mm trocar site, two sutures  with 4-0 Monocryl interrupted were placed and a 0.25% Marcaine was  infiltrated at both incision sites of 10 mL for postop analgesia. Attention  was then placed at the hysteroscopic portion. The long weighted speculum was  introduced into the vagina along with the Simms retractor. The Hulka  tenaculum was removed and the single tooth tenaculum was placed on the  interior cervical lip. The cervix required minimal dilatation and the  operating resectoscope with a 90 degree wire loop was introduced into the  intrauterine cavity. 3% sorbitol was the distending medium. A systematic  inspection of the uterine cavity demonstrated a small polyp in the right  lower uterine segment, both tubal ostia were identified and the fundus was  clear. No other abnormalities were noted in the uterus or in the cervical  canal. With the Valleylab electrical surgical generator set at 80/80, the  polyp was excised and the base was cauterized and the specimen was passed  off the operative field and submitted for histological evaluation. The  instrument was then removed, single tooth tenaculum was removed and fluid  deficit from the sorbitol was 100 mL.  From the operation, IV fluids was  1300 mL lactated Ringer's, urine output in and out catheter 75 mL. The  patient was extubated, transferred to the recovery room with stable vital  signs.                                               Juan H. Lily Peer, M.D.    JHF/MEDQ  D:  04/25/2003  T:  04/25/2003  Job:  [id number]

## 2010-12-17 NOTE — H&P (Signed)
[name], [name] NO.:  [id number]   MEDICAL RECORD NO.:  [id number]                   PATIENT TYPE:   LOCATION:                                       FACILITY:   PHYSICIAN:  Juan H. Lily Peer, M.D.             DATE OF BIRTH:  1960/12/28   DATE OF ADMISSION:  04/25/2003  DATE OF DISCHARGE:                                HISTORY & PHYSICAL   CHIEF COMPLAINT:  1. Dysfunctional uterine bleeding.  2. Endometrial polyp.  3. Request for elective permanent sterilization.  4. Hyperprolactinemia.   HISTORY:  The patient is a 50 year old, gravida 2, para 2, with a history of  dysfunctional uterine bleeding, whose workup has consisted of endometrial  biopsy in the office on March 06, 2003, demonstrating a proliferative-type  endometrium with breakdown.  She had a sonohysterogram done on March 11, 2003, which demonstrated a normal-size uterus, but on saline infusion,  posterior echogenic focus was seen measuring 6 x 3 x 6 mm, possible  endometrial polyp.  The left ovary was normal.  The right ovary had a small  echo-free cyst measuring 14 x 11 x 14 mm.  On part of her workup, the  patient was found to have an elevated prolactin at 74.3 and was placed on  Dostinex 0.5 mg twice a week.  To stop her bleeding, she was placed on  Megace 20 mg b.i.d.  Her Pap smear on September 04, 2002, was normal.  She  also had a prolactin, FSH, and TSH on March 05, 2003, which were all normal  with the exception of the elevated prolactin.  Her hemoglobin and hematocrit  were 13.2 and 38.8, respectively, with a platelet count of 343,000.   ALLERGIES:  She is allergic to aspirin.   PAST MEDICAL HISTORY:  1. She has had two previous cesarean sections.  2. History of hyperprolactinemia for which she is on Dostinex 0.5 mg twice a     week and she has also been on Megace 20 mg b.i.d.   FAMILY HISTORY:  The patient denies any family history of any malignancies.  She has one  sister in excellent health.   PHYSICAL EXAMINATION:  WEIGHT:  123 pounds.  HEENT:  Unremarkable.  NECK:  Supple.  Trachea midline.  No carotid bruits.  No thyromegaly.  LUNGS:  Clear to auscultation without rhonchi or wheezes.  HEART:  Regular rate and rhythm.  No murmurs or gallops.  BREASTS:  Exam was done at the time of her annual exam, which was reported  to be normal.  ABDOMEN:  Soft and nontender without rebound or guarding.  Pfannenstiel scar  was evident.  PELVIC:  Bartholin's, urethra, and Skene's glands within normal limits.  Vagina and cervix with no lesions or discharge.  Uterus anteverted and  normal in size, shape, and consistency.  Adnexa without palpable mass or  tenderness.  RECTAL:  Deferred.  ASSESSMENT:  The patient is a 50 year old, gravida 2, para 2, with request  for elective permanent sterilization.  The risks, benefits, pros, and cons  to include trauma to internal organs requiring open laparotomy.  The patient  is fully aware of the procedure being permanent and not being able to have  any more children.  Also risk for uterine perforation at the time of  hysteroscopic and removal of the endometrial polyp.  Also increased risk of  fluid overload and pulmonary edema.  Also the risk of infection, although  she will receive prophylactic antibiotic.  Also there is a risk of deep  venous thrombosis with resultant pulmonary embolism, although preventive  measures with PSA stockings will be provided.  Also, in the event of  uncontrollable hemorrhage and the patient were to need a blood transfusion,  there is an increased risk of anaphylactic reaction, hepatitis, and AIDS.  All of these issues were discussed with the patient.  I will follow  according.   PLAN:  The patient is scheduled to undergo elective sterilization procedure  along with resectoscopic polypectomy at the [facility] on  Friday, April 25, 2003, at 7:30 a.m.                                                Juan H. Lily Peer, M.D.    JHF/MEDQ  D:  04/23/2003  T:  04/23/2003  Job:  [id number]   cc:   [facility]

## 2011-05-17 ENCOUNTER — Ambulatory Visit (INDEPENDENT_AMBULATORY_CARE_PROVIDER_SITE_OTHER): Payer: BC Managed Care – PPO | Admitting: Internal Medicine

## 2011-05-17 ENCOUNTER — Encounter: Payer: Self-pay | Admitting: Internal Medicine

## 2011-05-17 VITALS — BP 130/80 | HR 80 | Temp 98.3°F | Wt 150.0 lb

## 2011-05-17 DIAGNOSIS — J029 Acute pharyngitis, unspecified: Secondary | ICD-10-CM

## 2011-05-17 LAB — POCT RAPID STREP A (OFFICE): Rapid Strep A Screen: NEGATIVE

## 2011-05-17 NOTE — Progress Notes (Signed)
  Subjective:    Patient ID: [name], female    DOB: March 16, 1961, 50 y.o.   MRN: [id number]  HPI Patient comes in today for SDA  For acute problem evaluation. Cough onset last week or so and then began having body aches and sore throats but 2 night ago had swallowing pain and fever.    May have been exposed to strep at work . No hx of same.  Low grade temp none now. Review of Systems No cp sob wheezing rash NVD  Had ha last week and not previous  Past history family history social history reviewed in the electronic medical record. Hx of sinusitis in the past. No face pain today. Works at [employer]     Objective:   Physical Exam wdwn in nad  HEENT: Normocephalic ;atraumatic , Eyes;  PERRL, EOMs  Full, lids and conjunctiva clear,,Ears: no deformities, canals nl, TM landmarks normal, Nose: no deformity or discharge  Mouth : OP mildy red  without lesion or edema . Neck shoddy nodes.   Chest:  Clear to A&P without wheezes rales or rhonchi CV:  S1-S2 no gallops or murmurs peripheral perfusion is normal rs     Assessment & Plan:  pharngitis uri    R/o strep      Expectant management.  Call if alarm features or  persistent or progressive

## 2011-05-17 NOTE — Patient Instructions (Addendum)
Strep rapid tests negative will notify you of culture tests. This is still probably a  Viral infection that should resolve on its own. In the next week or so. Call if high fever or swollen glands.

## 2011-07-05 ENCOUNTER — Encounter: Payer: Self-pay | Admitting: Internal Medicine

## 2011-07-05 ENCOUNTER — Ambulatory Visit (INDEPENDENT_AMBULATORY_CARE_PROVIDER_SITE_OTHER): Payer: BC Managed Care – PPO | Admitting: Internal Medicine

## 2011-07-05 DIAGNOSIS — J111 Influenza due to unidentified influenza virus with other respiratory manifestations: Secondary | ICD-10-CM | POA: Insufficient documentation

## 2011-07-05 DIAGNOSIS — R509 Fever, unspecified: Secondary | ICD-10-CM | POA: Insufficient documentation

## 2011-07-05 DIAGNOSIS — R6889 Other general symptoms and signs: Secondary | ICD-10-CM

## 2011-07-05 DIAGNOSIS — Z23 Encounter for immunization: Secondary | ICD-10-CM

## 2011-07-05 LAB — POCT INFLUENZA A/B
Influenza A, POC: NEGATIVE
Influenza B, POC: NEGATIVE

## 2011-07-05 MED ORDER — HYDROCODONE-HOMATROPINE 5-1.5 MG/5ML PO SYRP: 5.0000 mL | ORAL_SOLUTION | ORAL | Status: AC | PRN

## 2011-07-05 MED ORDER — OSELTAMIVIR PHOSPHATE 75 MG PO CAPS: 75.0000 mg | ORAL_CAPSULE | Freq: Two times a day (BID) | ORAL | Status: AC

## 2011-07-05 NOTE — Progress Notes (Signed)
  Subjective:    Patient ID: [name], female    DOB: 01/29/1961, 50 y.o.   MRN: [id number]  HPI Patient comes in today for SDA  For acute problem evaluation. Acute onset last night of body aches fever to 101 nasal congestion and cough. She took some acetaminophen last night this morning no fever but still severe body aches malaise and cough. No chest pain or shortness of breath. Her ears are  itching and clogged.  She never got around to having the flu shot yet this year    Review of Systems No cp sob cough is bad at night no wheezing No VD rash   No UTI sx  Past history family history social history reviewed in the electronic medical record.hx of uris cough and ear infections      Objective:   Physical Exam WDWN in NAD  quiet respirations; mildly congested  somewhat hoarse. Non toxic . Cough intermittent HEENT: Normocephalic ;atraumatic , Eyes;  PERRL, EOMs  Full, lids and conjunctiva clear,,Ears: no deformities, canals nl, TM landmarks normal, Nose: no deformity or discharge but congested;face non tender Mouth : OP clear without lesion or edema . Neck: Supple without adenopathy or masses or bruits Chest:  Clear to A&P without wheezes rales or rhonchi CV:  S1-S2 no gallops or murmurs peripheral perfusion is normal Abdomen:  Sof,t normal bowel sounds without hepatosplenomegaly, no guarding rebound or masses no CVA tenderness  Skin :nl perfusion and no acute rashes  Rapid flu negative      Assessment & Plan:  Flulike illness acute onset no obvious complication Discussed options this is early and we are seeing him influenza A. in the community began Tamiflu 75 one by mouth twice a day x5 day  Cough med for comfort.  History of recurrent sinusitis and cough    Expectant management.

## 2011-07-05 NOTE — Patient Instructions (Signed)
This is a viral infection and flu like Take the tamiflu immediately  This can help  If this is influenza . however it could be another virus . Fever should b subsiding in 3 days or so. Call if shortness of breath or concerns.  Influenza Facts Flu (influenza) is a contagious respiratory illness caused by the influenza viruses. It can cause mild to severe illness. While most healthy people recover from the flu without specific treatment and without complications, older people, young children, and people with certain health conditions are at higher risk for serious complications from the flu, including death. CAUSES   The flu virus is spread from person to person by respiratory droplets from coughing and sneezing.   A person can also become infected by touching an object or surface with a virus on it and then touching their mouth, eye or nose.   Adults may be able to infect others from 1 day before symptoms occur and up to 7 days after getting sick. So it is possible to give someone the flu even before you know you are sick and continue to infect others while you are sick.  SYMPTOMS   Fever (usually high).   Headache.   Tiredness (can be extreme).   Cough.   Sore throat.   Runny or stuffy nose.   Body aches.   Diarrhea and vomiting may also occur, particularly in children.   These symptoms are referred to as "flu-like symptoms". A lot of different illnesses, including the common cold, can have similar symptoms.  DIAGNOSIS   There are tests that can determine if you have the flu as long you are tested within the first 2 or 3 days of illness.   A doctor's exam and additional tests may be needed to identify if you have a disease that is a complicating the flu.  RISKS AND COMPLICATIONS  Some of the complications caused by the flu include:  Bacterial pneumonia or progressive pneumonia caused by the flu virus.   Loss of body fluids (dehydration).   Worsening of chronic medical  conditions, such as heart failure, asthma, or diabetes.   Sinus problems and ear infections.  HOME CARE INSTRUCTIONS   Seek medical care early on.   If you are at high risk from complications of the flu, consult your health-care provider as soon as you develop flu-like symptoms. Those at high risk for complications include:   People 65 years or older.   People with chronic medical conditions, including diabetes.   Pregnant women.   Young children.   Your caregiver may recommend use of an antiviral medication to help treat the flu.   If you get the flu, get plenty of rest, drink a lot of liquids, and avoid using alcohol and tobacco.   You can take over-the-counter medications to relieve the symptoms of the flu if your caregiver approves. (Never give aspirin to children or teenagers who have flu-like symptoms, particularly fever).  PREVENTION  The single best way to prevent the flu is to get a flu vaccine each fall. Other measures that can help protect against the flu are:  Antiviral Medications   A number of antiviral drugs are approved for use in preventing the flu. These are prescription medications, and a doctor should be consulted before they are used.   Habits for Good Health   Cover your nose and mouth with a tissue when you cough or sneeze, throw the tissue away after you use it.   Wash your  hands often with soap and water, especially after you cough or sneeze. If you are not near water, use an alcohol-based hand cleaner.   Avoid people who are sick.   If you get the flu, stay home from work or school. Avoid contact with other people so that you do not make them sick, too.   Try not to touch your eyes, nose, or mouth as germs ore often spread this way.  IN CHILDREN, EMERGENCY WARNING SIGNS THAT NEED URGENT MEDICAL ATTENTION:  Fast breathing or trouble breathing.   Bluish skin color.   Not drinking enough fluids.   Not waking up or not interacting.   Being so  irritable that the child does not want to be held.   Flu-like symptoms improve but then return with fever and worse cough.   Fever with a rash.  IN ADULTS, EMERGENCY WARNING SIGNS THAT NEED URGENT MEDICAL ATTENTION:  Difficulty breathing or shortness of breath.   Pain or pressure in the chest or abdomen.   Sudden dizziness.   Confusion.   Severe or persistent vomiting.  SEEK IMMEDIATE MEDICAL CARE IF:  You or someone you know is experiencing any of the symptoms above. When you arrive at the emergency center,report that you think you have the flu. You may be asked to wear a mask and/or sit in a secluded area to protect others from getting sick. MAKE SURE YOU:   Understand these instructions.   Monitor your condition.   Seek medical care if you are getting worse, or not improving.  Document Released: 07/21/2003 Document Revised: 03/30/2011 Document Reviewed: 04/16/2009 [facility] Patient Information 2012 [place], [place].

## 2011-07-07 ENCOUNTER — Telehealth: Payer: Self-pay | Admitting: *Deleted

## 2011-07-07 NOTE — Telephone Encounter (Signed)
Received call from pt stating she was seen on Tuesday and given Tamiflu and Hycodan. Pt has stopped Tamiflu due to nausea. Did not go to work yesterday or today and still had 99.5-100 temp. Pt is requesting a return call and work note for the last 3 days.

## 2011-07-07 NOTE — Telephone Encounter (Signed)
Spoke to pt and she is feeling better today except for cough. Temp has gone. Note fax to pt at [phone].

## 2011-07-08 ENCOUNTER — Encounter: Payer: BC Managed Care – PPO | Admitting: Gynecology

## 2011-07-11 ENCOUNTER — Other Ambulatory Visit (HOSPITAL_COMMUNITY)
Admission: RE | Admit: 2011-07-11 | Discharge: 2011-07-11 | Disposition: A | Discharge status: Home / Self Care | Payer: BC Managed Care – PPO | Source: Ambulatory Visit | Attending: Gynecology | Admitting: Gynecology

## 2011-07-11 ENCOUNTER — Encounter: Payer: Self-pay | Admitting: Gynecology

## 2011-07-11 ENCOUNTER — Ambulatory Visit (INDEPENDENT_AMBULATORY_CARE_PROVIDER_SITE_OTHER): Payer: BC Managed Care – PPO | Admitting: Gynecology

## 2011-07-11 DIAGNOSIS — Z01419 Encounter for gynecological examination (general) (routine) without abnormal findings: Secondary | ICD-10-CM | POA: Insufficient documentation

## 2011-07-11 DIAGNOSIS — R82998 Other abnormal findings in urine: Secondary | ICD-10-CM

## 2011-07-11 DIAGNOSIS — Z1211 Encounter for screening for malignant neoplasm of colon: Secondary | ICD-10-CM

## 2011-07-11 DIAGNOSIS — Z8639 Personal history of other endocrine, nutritional and metabolic disease: Secondary | ICD-10-CM | POA: Insufficient documentation

## 2011-07-11 DIAGNOSIS — N951 Menopausal and female climacteric states: Secondary | ICD-10-CM | POA: Insufficient documentation

## 2011-07-11 DIAGNOSIS — R635 Abnormal weight gain: Secondary | ICD-10-CM

## 2011-07-11 DIAGNOSIS — Z862 Personal history of diseases of the blood and blood-forming organs and certain disorders involving the immune mechanism: Secondary | ICD-10-CM

## 2011-07-11 NOTE — Progress Notes (Signed)
[name] [birth year] [id number]   History:    50 y.o.  for annual exam with no complaints today. Review of her record indicated she was weighing 137 last year is up to 152 with a BMI of 31.5. Patient has been menopausal for several years. Is on no hormone replacement therapy. It was noted in her record last year that her total cholesterol was elevated at 230 and she was called to come in for fasting lipid profile and did not do so. Many years ago she had hyperprolactinemia and was treated and has been off the medication for several years. Patient denies any galactorrhea are unusual headaches or visual disturbances. Her last mammogram was in January 2011. She has been counseled on numerous occasions to have a colonoscopy and she has not done so yet.  Past medical history,surgical history, family history and social history were all reviewed and documented in the EPIC chart.  Gynecologic History No LMP recorded. Patient is postmenopausal. Contraception: tubal ligation Last Pap: 2011. Results were: normal Last mammogram: 2011. Results were: normal  Obstetric History OB History    Grav Para Term Preterm Abortions TAB SAB Ect Mult Living   2 2 2       2      # Outc Date GA Lbr Len/2nd Wgt Sex Del Anes PTL Lv   1 TRM     F CS  No Yes   2 TRM     M CS  No Yes       ROS:  Was performed and pertinent positives and negatives are included in the history.  Exam: chaperone present  BP 128/86  Ht 4' 10.25" (1.48 m)  Wt 152 lb (68.947 kg)  BMI 31.50 kg/m2  Body mass index is 31.50 kg/(m^2).  General appearance : Well developed well nourished female. No acute distress HEENT: Neck supple, trachea midline, no carotid bruits, no thyroidmegaly Lungs: Clear to auscultation, no rhonchi or wheezes, or rib retractions  Heart: Regular rate and rhythm, no murmurs or gallops Breast:Examined in sitting and supine position were symmetrical in appearance, no palpable masses or tenderness,  no skin  retraction, no nipple inversion, no nipple discharge, no skin discoloration, no axillary or supraclavicular lymphadenopathy Abdomen: no palpable masses or tenderness, no rebound or guarding Extremities: no edema or skin discoloration or tenderness  Pelvic:  Bartholin, Urethra, Skene Glands: Within normal limits             Vagina: No gross lesions or discharge  Cervix: No gross lesions or discharge  Uterus  anteverted, normal size, shape and consistency, non-tender and mobile  Adnexa  Without masses or tenderness  Anus and perineum  normal   Rectovaginal refused rectal exam    Assessment/Plan:  50 y.o. female for annual exam overweight. Discussed importance of regular exercise. Last bone density study study December 2010 she will schedule one for next year here. As well as her mammogram next month. She was encouraged to do her monthly self breast examination. I've given her fecal occult blood testing cards to submit to the office for testing and have given her the name of gastroenterologist here in the community for her to schedule appointment.. The following labs will be drawn today. TSH, and blood sugar, total cholesterol, CBC, prolactin, urinalysis and Pap smear. Literature formation weight reduction and appropriate diet was provided today as well. She was instructed take her calcium and vitamin D for osteoporosis prevention as well.    Ok Edwards MD, 1:52 PM 07/11/2011

## 2011-07-11 NOTE — Patient Instructions (Signed)
Please remember to schedule your colonoscopy with Dr. Loreta Ave. Also remember to send [place] the stool c                                          Dietary therapy for weight gain   INTRODUCTION - The optimal management of overweight and obesity requires a combination of diet, exercise, and behavioral modification. In addition, some patients eventually require pharmacologic therapy or bariatric surgery. The risk of overweight to the subject should be evaluated before beginning any treatment program. Selection of treatment can then be made using a risk-benefit assessment). The choice of therapy is dependent on several factors including the degree of overweight or obesity and patient preference.  This topic will review the dietary therapy of obesity. Other aspects of treatment are discussed separately. (See "Health hazards associated with obesity in adults" and "Overview of therapy for obesity in adults" and "Drug therapy of obesity" and "Behavioral strategies in the treatment of obesity".) GOALS OF WEIGHT LOSS - It is important to set goals when discussing a dietary weight loss program with an individual patient. An initial weight loss goal of 5 to 7 percent of body weight is realistic for most individuals. The first goal for any overweight individual is to prevent further weight gain and keep body weight stable (within 5 pounds of its current level).  The goal of the clinician is to identify and review with the patient a realistic weight-loss goal. Most patients have a weight loss goal of 30 percent or more below current weight, which is unrealistic [1].  A successful program will lead to a weight loss of more than 5 percent of initial weight [2]. A weight loss of more than 5 percent can reduce risk factors for cardiovascular disease, such as dyslipidemia, hypertension, and diabetes mellitus [3]. In the Diabetes Prevention Program, a multi-center trial in patients with impaired glucose tolerance, weight loss of 7  percent reduced the rate of progression from impaired glucose tolerance to diabetes by 58 percent [4]. (See "Prediction and prevention of type 2 diabetes mellitus", section on 'Diabetes Prevention Program'.)  Loss of 5 percent of initial body weight and maintenance of this loss is a good medical result, even if the subject does not reach his or her "dream" weight.  Although an extremely difficult goal to achieve, a body mass index (BMI) between 20 and 25 kg/m2 puts the subject in the lowest risk category (table 1 and figure 1). DIETARY ENERGY Rate of weight loss - The rate of weight loss is directly related to the difference between the subject's energy intake and energy requirements. Reducing caloric intake below expenditure results in a predictable initial rate of weight loss that is related to the energy deficit [5,6]. However, prediction of weight loss for an individual subject can be difficult because of marked intersubject variability in initial body composition, adherence, and energy expenditure [5,7]. Food records are often inaccurate. Most normal-weight people under-report what they eat by 10 to 30 percent, while overweight people under-report by 30 percent or more [8]. In addition, energy requirements are influenced by fidgeting, gender, age, and genetic factors [5,6,9]. As examples: Men lose more weight than women of similar height and weight when they comply with eating any given diet because men have more lean body mass, less percent body fat, and therefore higher energy expenditure.  Older subjects of either sex have a  lower energy expenditure and therefore lose weight more slowly than younger subjects; metabolic rate declines by approximately 2 percent per decade (about 100 kcal/decade) [10].  The importance of genetic factors is illustrated by a study of identical female twin pairs who were overfed to induce weight gain [11]. Twelve twin pairs were overfed by 1000 kcal/day for 84 of 100 days.  The degree of weight gain at a constant dietary caloric increment varied widely among the twin pairs (from 4.3 to 13.3 kg), in fact, there was three times the variance for both weight and fat mass among the twin pairs when compared with that within the twin pairs. Approximately 22 kcal/kg is required to maintain a kilogram of body weight in a normal adult. Thus, the expected or calculated energy expenditure for a woman weighing 100 kg is approximately 2200 kcal/day. The variability of 20 percent could give energy needs as high as 2620 kcal/day or as low as 1860 kcal/day. An average deficit of 500 kcal/day should result in an initial weight loss of approximately 0.5 kg/week (1 lb/week). However, after three to six months of weight loss, energy expenditure adaptations occur, which slow the bodyweight response to a given change in energy intake, thereby diminishing ongoing weight loss [7]. There are several methods of formally estimating energy expenditure; we suggest using the WHO criteria (table 2). This method allows a direct estimate of resting metabolic rate (RMR) and calculation of daily energy requirement. The low activity level (1.3 x RMR) includes subjects who lead a sedentary life. The high activity level (1.7 x RMR) applies to those in jobs requiring manual labor or patients with regular daily physical exercise programs [12]. Maintenance of weight loss - It is important for the overweight subject to understand that achieving and maintaining weight loss is made difficult by the reduction in energy expenditure that is induced by weight loss (figure 2) [13]. Weight loss maintenance is also difficult because of changes in the peripheral hormone signals that regulate appetite. Gastrointestinal peptides, such as ghrelin, which stimulates appetite, and gastric inhibitory polypeptide, which may promote energy storage, increase after diet-induced weight loss. Other circulating mediators that inhibit intake (eg,  leptin, peptide YY, cholecystokinin, pancreatic polypeptide) decrease. These hormonal adaptations favoring weight gain persist for at least one year after diet-induced weight loss [14]. (See "Overview of therapy for obesity in adults", section on 'Maintenance of weight loss' and "Pathogenesis of obesity", section on 'Ghrelin'.)  TYPES OF DIETS - The general consensus is that excess intake of calories from any source, associated with a sedentary lifestyle, causes weight gain and obesity. The goal of dietary therapy, therefore, is to decrease energy intake from food. Conventional diets are defined as those below energy requirements but above 800 kcal/day [15]. These diets fall into four groups: Balanced low-calorie diets/portion-controlled diets  Low-fat diets  Low-carbohydrate diets  Mediterranean diet  Fad diets (diets involving unusual combinations of foods or eating sequences) Commercial weight loss programs and internet-based programs are discussed elsewhere. (See "Behavioral strategies in the treatment of obesity".) Balanced low-calorie diets - Planning a diet requires the selection of a caloric intake and then selection of foods to meet this intake. It is desirable to eat foods with adequate nutrients in addition to protein, carbohydrate, and essential fatty acids. Thus, weight-reducing diets should eliminate alcohol, sugar-containing beverages, and most highly concentrated sweets because they rarely contain adequate amounts of other nutrients besides energy. Breakdown of some protein is to be expected during weight loss. When weight increases as a  result of overeating, approximately 75 percent of the extra energy is stored as fat and the remaining 25 percent as lean tissue. If the lean tissue contains 20 percent protein, then 5 percent of the extra weight gain would be protein. Thus, it should be anticipated that during weight loss, at least 5 percent of weight loss will be protein. A desirable  feature of any calorie restricted diet, however, is that it results in the lowest possible loss of protein, recognizing that this will not be less than 5 percent of the weight that is lost. Portion-controlled diets - One simple approach to providing a calorie-controlled diet is to use individually packaged foods, such as formula diet drinks using powdered or liquid formula diets, nutrition bars, frozen food, and pre-packaged meals that can be stored at room temperature as the main source of nutrients. Frozen low-calorie meals containing 250 to 350 kcal/package can be a convenient and nutritious way to do this. We have often recommended the use of formula diets or breakfast bars for breakfast, formula diets or a frozen lunch entree for lunch, and a frozen calorie-controlled entree with additional vegetables for dinner. In this way, it is possible to obtain a calorie-controlled 1000 to 1500 kcal per day diet. In one four-year study this approach resulted in early initial weight loss, which then was maintained [16]. I do not recommend the use of formula diets alone because they do not provide adequate nutritional variety. Low-fat diets - Low-fat diets are another standard strategy to help patients lose weight, and almost all dietary guidelines recommend a reduction in the daily intake of fat to 30 percent of energy intake or less [17,18]. In a meta-analysis of trials comparing low-fat diets (typically 20 to 25 percent of energy from fats) with a control group consuming a usual diet or a medium fat diet (usually 35 to 40 percent of energy), there was greater weight loss (approximately 3 kg) with low-fat compared with moderate fat diets [19]. In addition, one report noted that people who successfully keep their weight reduced adopt three strategies, one of which is eating a lower fat diet [20]. (See "Dietary fat" and "Etiology and natural history of obesity", section on 'Dietary habits'.) A low-fat dietary pattern  with healthy carbohydrates is not associated with weight gain. This was illustrated by the [facility] Dietary Modification Trial of [phone] postmenopausal women over age 8 years who were randomly assigned to a dietary intervention that included group and individual sessions to promote a decrease in fat intake and increases in fruit, vegetable, and grain consumption (healthy carbohydrates), but did not include weight loss or caloric restriction goals, or a control group which received only dietary educational materials [21]. After an average of 7.5 years of follow-up, the following results were seen: Women in the intervention group lost weight in the first year (mean of 2.2 kg) and maintained lower weight than the control women at 7.5 years (difference of 1.9 kg at one year, and 0.4 kg at 7.5 years).  No tendency toward weight gain was seen in the intervention group overall, or when stratified by age, ethnicity, or body mass index.  Weight loss was related to the level of fat intake and was greatest in women who decreased their percentage of energy from fat the most. A similar, but lesser trend was seen with increased vegetable and fruit intake. A low-fat diet can be implemented in two ways. First, the dietitian can provide the subject with specific menu plans that emphasize the  use of reduced fat foods. As one guideline, if a food "melts" in your mouth, it probably has fat in it. Second, subjects can be instructed in counting fat grams as an alternative to counting calories. Fat has 9.4 kcal/g. It is thus very easy to calculate the number of grams of fat a subject can eat for any given level of energy intake. Many experts recommend keeping calories from fat to below 30 percent of total calories. In practical terms, this means eating about 33 g of fat for each 1000 calories in the diet. For simplicity, I use 30 g of fat or less for each 1000 kcal. For a 1500-calorie diet, this would mean about 45  g or less of fat, which can be counted using the nutrition information labels on food packages. Low-carbohydrate diets - Proponents of low-carbohydrate diets have argued that the increasing obesity epidemic may be in part due to low-fat, high-carbohydrate diets. But this may be dependent upon the type of carbohydrates that are eaten, such as energy dense snacks and sugar or high fructose containing beverages. The carbohydrate content of the diet is an important determinant of short-term (less than two weeks) weight loss. Low (60 to 130 grams of carbohydrates) and very low-carbohydrate diets (0 to <60 grams) have been popular for many years [15]. Restriction of carbohydrates leads to glycogen mobilization and, if carbohydrate intake is less than 50 g/day, ketosis will develop. Rapid weight loss occurs, primarily due to glycogen breakdown and fluid loss rather than fat loss. Low and very low-carbohydrate diets are more effective for short-term weight loss than low-fat diets, although probably not for long-term weight loss. A meta-analysis of five trials found that the difference in weight loss at six months, favoring the low carbohydrate over low fat diet, was not sustained at 12 months [22]. (See 'Comparison trials' below.) Low-carbohydrate diets may have some other beneficial effects with regard to risk of developing type 2 diabetes mellitus, coronary heart disease, and some cancers, particularly if attention is paid to the type as well as the quantity of carbohydrate. A low-carbohydrate diet can be implemented in two ways, either by reducing the total amount of carbohydrate or by consuming foods with a lower glycemic index or glycemic load (table 3). Glycemic index and load are reviewed separately. (See "Dietary carbohydrates", section on 'Glycemic index'.) If a low-carbohydrate diet is chosen, healthy choices for fat (mono- and polyunsaturated fats) and protein (fish, nuts, legumes, and poultry) should be  encouraged because of the association between saturated fat intake and risk of coronary heart disease. During 26 years of follow-up of women in the Nurses' Health Study and 20 years of follow-up of men in the Health Professionals' Follow-up Study, low carbohydrate diets in the highest versus lowest decile for vegetable proteins and fat were associated with lower all-cause mortality (HR 0.80, 95% CI 0.75-0.85) and cardiovascular mortality (HR 0.77, 95% CI 0.68-0.87) [23]. In contrast, low carbohydrate diets in the highest versus lowest decile for animal protein and fat were associated with higher all-cause (HR 1.23, 95% CI 1.11-1.37) and cardiovascular (HR 1.14, 95% CI 1.01-1.29) mortality. (See "Dietary fat" and "Overview of primary prevention of coronary heart disease and stroke", section on 'Healthy diet'.) High protein diets - Some popular books recommend high protein diets [24]. In one trial, low-fat diets with 12 percent and 25 percent protein content were compared. Weight loss over six months was greater with the higher protein diet (9 versus 5 kg), but the difference was no longer significant  at 12 and 24 months [25]. Higher protein diets may improve weight maintenance, as illustrated by the results of a study of 60 subjects randomly assigned to a low fat, high protein versus low-fat, high-carbohydrate diet after completing a four week very low calorie diet [26]. Among the subjects who completed the three-month study (n = 48), the high protein diet group had significantly better weight maintenance (between group difference of 2.3 kg). High dietary protein intake, due to its acid-producing load, increases urinary calcium excretion (with potential risk for bone loss and calcium stone formation) [27]. Urinary calcium excretion does appear to increase when dietary intake of protein increases [27-29], and this could pose a long-term risk for nephrolithiasis. (See "Risk factors for calcium stones in adults",  section on 'Dietary risk factors'.) However, two small randomized trials that looked at bone metabolism found evidence that increased dietary protein may decrease bone resorption [28,29]. One of the trials found that increased intestinal absorption of calcium was primarily responsible for the increased urinary excretion of calcium and that the excreted calcium was not coming from bone [29]. Mediterranean diet - The term Mediterranean diet refers to a dietary pattern that is common in [place] areas of the [place] area. Although there is some variation in Mediterranean diets, there are some common components that include a high level of monounsaturated fat relative to saturated; moderate consumption of alcohol, mainly as wine; a high consumption of vegetables, fruits, legumes, and grains; a moderate consumption of milk and dairy products, mostly in the form of cheese; and a relatively low intake of meat and meat products. A meta-analysis of 12 studies involving eight cohorts found that a Mediterranean diet was associated with improved health status and reductions in overall mortality, cardiovascular mortality, cancer mortality, and incidence of Parkinson's disease and Alzheimer's disease [30]. (See "Healthy diet in adults", section on 'Mediterranean diet'.) Very low-calorie diets - Diets with energy levels between 200 and 800 kcal/day are called "very low-calorie diets," while those below 200 kcal/day can be termed starvation diets. The basis for these diets was the notion that the lower the calorie intake the more rapid the weight loss, because the energy withdrawn from body fat stores is a function of the energy deficit. Starvation is the ultimate very low-calorie diet and results in the most rapid weight loss. Although once popular, starvation diets are now rarely used for treatment of obesity. Very low-calorie diets have not been shown to be superior to conventional diets for long-term weight loss.  In a meta-analysis of six trials comparing very low-calorie diets with conventional low-calorie diets, short-term weight loss was greater with very low-calorie diets (16.1 versus 9.7 versus percent of initial weight), but there was no difference in long-term weight loss (6.3 versus 5.0 percent) [31]. As with all diets, very low-calorie diets initially result in substantial protein loss that diminishes with time. Other expected effects include reduction in blood pressure and improvement in hyperglycemia in diabetic patients. Subjects adhering to very low-calorie diets usually have a fall in blood pressure, especially during the first week. Antihypertensive drugs, especially calcium channel blockers and diuretics, should usually be discontinued when a very low calorie diet is begun unless moderate to severe hypertension is present.  Most diabetic patients eating very low-calorie diets have marked improvement in hyperglycemia. Blood glucose concentrations fall within the first one to two weeks, and remain lower as long as the diet is continued. Those patients taking less than 50 units of insulin or an oral hypoglycemic drug will usually  be able to discontinue therapy [32]. The side effects of very low-calorie diets include hair loss, thinning of the skin, and coldness. These diets are contraindicated for lactating and pregnant women, and in children who require protein for linear growth. As with all diets, there is increased cholesterol mobilization from peripheral fat stores, thus increasing the risk of gallstones. Very low-calorie diets should be reserved for subjects who require rapid weight loss for a specific purpose, such as surgery. The weight regain when the diet is stopped is often rapid, and it is better to take a more sustainable approach than to use a method that cannot be sustained. Comparison trials - The impact of specific dietary composition on weight change remains uncertain. When energy from  dietary carbohydrates decreases, energy from fat sources tends to increase. The reverse is also true; when energy from dietary fats decreases, energy from carbohydrate sources tends to increase. The debate has mainly centered on whether low-fat or low-carbohydrate diets can better induce weight loss and sustain it over the long-term. Weight loss diets - Initial trials evaluating the effect of type of diet (predominantly low-carbohydrate versus low-fat) on weight loss and other outcomes showed that weight loss at six months was approximately 4 kg greater in the very low-carbohydrate group than in the low-fat group [33-35]. Trials lasting for one year, however, did not find a significant difference in weight loss [34,36,37]. A meta-analysis of five trials (including one study not referenced above) found that the difference in weight loss at six months, favoring the low carbohydrate over low fat diet, was not sustained at 12 months [22]. In one study, this convergence was mainly due to regain of weight in the low-carbohydrate group [34]; in another, the convergence was due to ongoing weight loss in the low-fat group (figure 3) [36]. Some of these initial comparison trials of different dietary regimens had important limitations [22]. These included high dropout rates (21 to 48 percent), suboptimal dietary compliance, and limited long-term follow-up. Subsequent trials are larger, of longer duration (lasting one to two years), and have conflicting results with regard to the impact of macronutrient composition on weight loss [38-41]. In contrast, all trials found that dietary adherence is an important determinant of weight loss, independent of macronutrient composition. The following observations illustrate the range of findings in these trials: In one trial, 322 moderately obese subjects (86 percent men) were randomly assigned to a low-fat (restricted calorie), Mediterranean (moderate-fat, restricted calorie, rich in  vegetables, low in red meat), or low-carbohydrate (non-restricted-calorie) diet for two years [38]. Adherence rates were higher than those reported in previous trials (95.4 and 84.6 percent at one and two years, respectively). Weight loss was greater with the Mediterranean and low-carbohydrate diets than the low-fat diet (mean weight loss 4.4, 4.7, and 2.9 kg, respectively).  The most favorable effect on lipids (increased HDL and decreased triglycerides and ratio of total cholesterol to HDL) was seen in the low-carbohydrate group. Among subjects with type 2 diabetes, the greatest improvement in glycemic control occurred with the Mediterranean diet. Among all groups, weight loss was greater for those who completed the two year study than for those who withdrew.  Another randomized trial compared four different diets in 311 overweight and obese premenopausal women: very low-carbohydrate (Atkins); macronutrient balance controlling glycemic load (Zone); general calorie restriction, low-fat (LEARN); and very low-fat (Ornish) [39]. In the intention-to-treat analysis at one year, mean weight loss was greater in the Atkins diet group compared with the other groups (4.7, 1.6, 2.2, and  2.6 kg, respectively). Pairwise comparisons showed a significant difference only for Atkins versus Zone.  The most favorable effect on triglycerides and HDL-C was seen in the Atkins group. Dietary adherence rates (77 to 88 percent) were similar among the groups and better than in previous trials. Within each group, adherence was significantly associated with weight loss [42].  In the largest trial to date, 811 overweight and obese adults were randomly assigned to one of four diets based upon macronutrient content: low or high fat (20 to 40 percent), which provided carbohydrate at 35, 45, 55, or 65 percent, and high or average protein (15 to 25 percent) [40]. After six months, mean weight loss in each group was 6 kg. By two years, mean  weight loss was 3 to 4 kg, and weight losses remained similar in all groups. Many participants had trouble attaining target levels of macronutrients. Subjects who attended the greatest number of group sessions (most adherent) lost the most weight. Thus, any diet that is adhered to will produce modest weight loss, but adherence rates are low with most diets. Although a low-carbohydrate diet may be associated with greater short-term weight loss, superior weight loss in the long-term has not been established. The optimal mix of macronutrients likely depends upon individual factors [43]. A principal determinant of weight loss appears to be the degree of adherence to the diet, irrespective of the particular macronutrient composition [37,39,40,42,44,45]. Thus, we suggest choosing a macronutrient mix based upon patient preferences, which may improve long-term adherence. Behavioral modification to improve dietary compliance with any type of diet may have the greatest impact on long-term weight loss. (See "Behavioral strategies in the treatment of obesity".) Lipids - The observed effects on blood lipids were similar for trials comparing low fat and very low carbohydrate diets [22,33-36,46]; the low-carbohydrate/high-fat diets caused slight increases in HDL, and greater decreases in fasting triglycerides. At 12 to 24 months, however, the favorable effects on HDL persisted [34,36,37,41], while triglyceride levels were either reduced [34,36] or returned to baseline [37]. In a meta-analysis of trials comparing low-carbohydrate and low-fat diet groups, LDL levels were increased in the low-carbohydrate group [22]. There was no clear benefit of either low-fat or low-carbohydrate diet on cardiovascular risks. Favorable changes in HDL cholesterol and triglycerides should be weighed against potential unfavorable changes in LDL cholesterol.  Side effects - Very low-carbohydrate diets may be associated with more frequent side  effects than low-fat diets. In one of the trials noted above, a number of symptoms occurred significantly more frequently in the low-carbohydrate compared to the low-fat diet group [33]. These included constipation (68 versus 35 percent), headache (60 versus 40 percent), halitosis (38 versus 8 percent), muscle cramps (35 versus 7 percent), diarrhea (23 versus 7 percent), general weakness (25 versus 8 percent), and rash (13 versus 0 percent) [33]. Despite the higher rate of symptoms, dropout rates in clinical trials have been similar for low-carbohydrate and low-fat diets [34-36]. Some have raised the concern about ketosis that occurs with very low-carbohydrate diets. There is one case report of an obese patient who presented in severe ketoacidosis, having lost 9 kg in one month on the Atkins diet, with intake restricted to meat, cheese, and salads [47]. Aside from her diet and possible mild dehydration due to gastroenteritis, no other cause for her ketoacidosis was identified. Weight maintenance diets - Although many individuals have success losing weight with diet, most subsequently regain much or all of the lost weight. Maintaining weight loss is made difficult by the reduction  in energy expenditure that is induced by weight loss. In addition, long-term adherence to restrictive diets is difficult. Exercise and behavioral interventions may help individuals maintain weight loss. These strategies are reviewed in detail elsewhere. (See "Role of physical activity and exercise in obesity", section on 'Maintenance of weight loss' and "Behavioral strategies in the treatment of obesity", section on 'Maintenance of weight loss'.) There is little consensus on the optimal mix of macronutrients to maintain weight loss. The satiating effects of high protein, low glycemic index diets have generated interest in manipulating protein composition and glycemic index in weight maintenance diets. (See 'High protein diets' above and  "Dietary carbohydrates", section on 'Effect of glycemic index/glycemic load'.) In a multicenter trial of five ad libitum diets to prevent weight regain over 26 weeks, 773 adults who had successfully lost 8 percent of their body weight on a low calorie diet (800 to 1000 kcal/day), were randomly assigned in a two-by-two factorial design to a high or low-protein (25 versus 13 percent of total calories), high or low-glycemic index, or to a control diet (moderate protein content) [48]. All diets had a moderate fat content (25 to 30 percent). The achieved protein content was 5 percentage points higher in the high versus low protein groups, and the mean glycemic index was five units lower in the low-glycemic versus high-glycemic index groups. In the intention-to-treat analysis, weight regain during the trial was modestly but significantly greater in the low versus high-protein groups (mean difference 0.93 kg) and in the high versus low-glycemic index groups (mean difference 0.95 kg). Only subjects in the high-protein, low-glycemic index diet group continued to lose weight (mean change -0.38 kg). The trial was limited by the moderate dropout rate (29 percent) and short-term follow-up (six months). Whether a low glycemic index, high protein diet is associated with long-term weight maintenance is unknown. As discussed above, long-term adherence to a weight maintaining diet is probably the most important determinant of success, and therefore the optimal weight maintaining diet will depend upon preference and individual factors. Role of dietary counseling - Dietary counseling may produce modest, short-term weight losses. This topic is reviewed in detail elsewhere. (See "Behavioral strategies in the treatment of obesity", section on 'Elements of behavioral strategies' and "Behavioral strategies in the treatment of obesity", section on 'Efficacy'.)  Prolonged caloric restriction and longevity - Prolonged caloric restriction  improves longevity in rodents and non-human primates [49], but it is not known if the same is true in humans. It is hypothesized that the antiaging effects of caloric restriction are due to reduced energy expenditure resulting in a reduction in production of reactive oxygen species (and therefore a reduction in oxidative damage). In addition, other metabolic effects associated with caloric restriction, such as improved insulin sensitivity, might also have an antiaging effect. In one trial of 48 sedentary, overweight men and women, six months of caloric restriction, with or without exercise, resulted in significant weight loss as expected [50]. In addition, calorie restriction-mediated reductions in fasting insulin concentrations, core body temperature, serum T3 levels, and oxidative damage to DNA (as reflected by a reduction in DNA fragmentation) were seen, suggesting a possible antiaging effect of the prolonged caloric restriction. INFORMATION FOR PATIENTS - UpToDate offers two types of patient education materials, "The Basics" and "Beyond the Basics." The Basics patient education pieces are written in plain language, at the 5th to 6th grade reading level, and they answer the four or five key questions a patient might have about a given condition. These articles are  best for patients who want a general overview and who prefer short, easy-to-read materials. Beyond the Basics patient education pieces are longer, more sophisticated, and more detailed. These articles are written at the 10th to 12th grade reading level and are best for patients who want in-depth information and are comfortable with some medical jargon. Here are the patient education articles that are relevant to this topic. We encourage you to print or e-mail these topics to your patients. (You can also locate patient education articles on a variety of subjects by searching on "patient info" and the keyword(s) of interest.)  Basics topics (see  "Patient information: Diet and health (The Basics)" and "Patient information: Weight loss treatments (The Basics)")  Beyond the Basics topics (see "Patient information: Diet and health (Beyond the Basics)" and "Patient information: Weight loss treatments (Beyond the Basics)" and "Patient information: Weight loss surgery (Beyond the Basics)")  SUMMARY AND RECOMMENDATIONS An initial weight loss goal of 5 to 7 percent of body weight is realistic for most individuals. (See 'Goals of weight loss' above.)  Many types of diets produce modest weight loss. Options include balanced low-calorie, low-fat low-calorie, moderate-fat low calorie, low-carbohydrate diets, and the Mediterranean diet. Dietary adherence is an important predictor of weight loss, irrespective of the type of diet. (See 'Types of diets' above.)  We suggest tailoring a diet that reduces energy intake below energy expenditure to individual patient preferences, rather than focusing on the macronutrient composition of the diet (Grade 2B). (See 'Comparison trials' above.)  If a low-carbohydrate diet is chosen, healthy choices for fat (mono and polyunsaturated) and protein (fish, nuts, legumes, and poultry) should be encouraged. If a low-fat diet is chosen, the decrease in fat should be accompanied by increases in healthy carbohydrates (fruits, vegetables, whole grains).  ards. See the following:

## 2011-07-19 ENCOUNTER — Ambulatory Visit (INDEPENDENT_AMBULATORY_CARE_PROVIDER_SITE_OTHER): Payer: BC Managed Care – PPO | Admitting: Gynecology

## 2011-07-19 DIAGNOSIS — E7800 Pure hypercholesterolemia, unspecified: Secondary | ICD-10-CM

## 2011-08-26 ENCOUNTER — Other Ambulatory Visit: Payer: Self-pay | Admitting: Gynecology

## 2011-08-26 DIAGNOSIS — Z1231 Encounter for screening mammogram for malignant neoplasm of breast: Secondary | ICD-10-CM

## 2011-09-23 ENCOUNTER — Ambulatory Visit (HOSPITAL_COMMUNITY)
Admission: RE | Admit: 2011-09-23 | Discharge: 2011-09-23 | Disposition: A | Discharge status: Home / Self Care | Payer: BC Managed Care – PPO | Source: Ambulatory Visit | Attending: Gynecology | Admitting: Gynecology

## 2011-09-23 DIAGNOSIS — Z1231 Encounter for screening mammogram for malignant neoplasm of breast: Secondary | ICD-10-CM | POA: Insufficient documentation

## 2011-09-27 ENCOUNTER — Encounter: Payer: Self-pay | Admitting: Internal Medicine

## 2011-09-27 ENCOUNTER — Ambulatory Visit (INDEPENDENT_AMBULATORY_CARE_PROVIDER_SITE_OTHER): Payer: BC Managed Care – PPO | Admitting: Internal Medicine

## 2011-09-27 DIAGNOSIS — J988 Other specified respiratory disorders: Secondary | ICD-10-CM | POA: Insufficient documentation

## 2011-09-27 MED ORDER — ALBUTEROL SULFATE HFA 108 (90 BASE) MCG/ACT IN AERS: 2.0000 | INHALATION_SPRAY | Freq: Four times a day (QID) | RESPIRATORY_TRACT | Status: DC | PRN

## 2011-09-27 MED ORDER — PREDNISONE 20 MG PO TABS: ORAL_TABLET | ORAL | Status: DC

## 2011-09-27 MED ORDER — HYDROCODONE-HOMATROPINE 5-1.5 MG/5ML PO SYRP: 5.0000 mL | ORAL_SOLUTION | ORAL | Status: DC | PRN

## 2011-09-27 NOTE — Patient Instructions (Addendum)
This appears to be a viral respiratory infection and a chest cold. You have some mild wheezing with this which could be triggered by the infection of the virus. This should resolve on its own however if the wheezing is getting worse and you were getting tight in the chest you can try an inhaler and 5 days of prednisone which will decrease inflammation in the bronchial tubes.  Call us if you think you need to try these.  Cough medicine at night for comfort her cough will last at least another week if not more but you should feel a lot better. Your oxygen level is very good today Call us if you have high fever chills and shortness of breath.

## 2011-09-27 NOTE — Progress Notes (Signed)
  Subjective:    Patient ID: [name], female    DOB: 1961/05/19, 51 y.o.   MRN: [id number]  HPI  Patient comes in today for SDA  For acute problem evaluation. Onset 3-4 days of uri and now cough . No fever.  No sob .   No meds for this  Except robitussin.  Wheezing  Loudly last pm.  While asleep per  Husband .  Thus comes in to get checked  Review of Systems Neg cp sob fever vomiting sore throat .   No hx ofa sthma or allergy  No VND rash   Past history family history social history reviewed in the electronic medical record.     Objective:   Physical Exam WDWN in NAD  quiet respirations; mildly congested  somewhat hoarse. Non toxic . HEENT: Normocephalic ;atraumatic , Eyes;  PERRL, EOMs  Full, lids and conjunctiva clear,,Ears: no deformities, canals nl, TM landmarks normal, Nose: no deformity or discharge but congested;face non  tender Mouth : OP clear without lesion or edema . Neck: Supple without adenopathy or masses or bruits Chest:  BS + good air movement  Some end expiratory wheezes   No  rales or rhonchi CV:  S1-S2 no gallops or murmurs peripheral perfusion is normal Skin :nl perfusion and no acute rashes       Assessment & Plan:  Wheezy bronchitis   viral  Cause  Doesn't feel that bad and no fever sob.  Poss croup like viral .   Good oxygenation at this point  .     Expectant management. Cough med for now and fu if needed.

## 2011-12-14 ENCOUNTER — Ambulatory Visit (INDEPENDENT_AMBULATORY_CARE_PROVIDER_SITE_OTHER): Payer: BC Managed Care – PPO | Admitting: Internal Medicine

## 2011-12-14 ENCOUNTER — Encounter: Payer: Self-pay | Admitting: Internal Medicine

## 2011-12-14 VITALS — BP 122/80 | HR 94 | Temp 98.3°F | Wt 149.0 lb

## 2011-12-14 DIAGNOSIS — R011 Cardiac murmur, unspecified: Secondary | ICD-10-CM

## 2011-12-14 DIAGNOSIS — G4730 Sleep apnea, unspecified: Secondary | ICD-10-CM | POA: Insufficient documentation

## 2011-12-14 DIAGNOSIS — G478 Other sleep disorders: Secondary | ICD-10-CM

## 2011-12-14 DIAGNOSIS — R5381 Other malaise: Secondary | ICD-10-CM

## 2011-12-14 DIAGNOSIS — R0609 Other forms of dyspnea: Secondary | ICD-10-CM

## 2011-12-14 DIAGNOSIS — R0683 Snoring: Secondary | ICD-10-CM | POA: Insufficient documentation

## 2011-12-14 DIAGNOSIS — R5383 Other fatigue: Secondary | ICD-10-CM | POA: Insufficient documentation

## 2011-12-14 NOTE — Progress Notes (Signed)
  Subjective:    Patient ID: [name], female    DOB: [birth year], 51 y.o.   MRN: [id number]  HPI Patient comes in today for SDA for  new problem evaluation. She is here with her husband. Concerned over the last 3 months of increasing fatigue sleepiness loud snoring to the point where her husband has to wake her up or sleep in the other rhythm. She's tired all the time doesn't fall sleep with conversation but has been such a fall sleep in the car easily. He feels that she wakes up because her husband pushes her to stop the morning but she also has some problems with her feet and feels like they hurt problems with them at night. She's no longer having allergy symptoms or head cold sinus symptoms.  She does not have a history of anemia or thyroid disease and this has been checked in the past. She does have a gynecologist.   Review of Systems Negative for chest pain shortness of breath or syncope there is decreased libido not taking any regular medicines but has been given calcium vitamin D and glucosamine to take. She has a past history of prolactinoma? No family history of known sleep apnea father had some type of valvular problem. Nonsmoker daughter getting married this summer  Past history family history social history reviewed in the electronic medical record.     Objective:   Physical Exam BP 122/80  Pulse 94  Temp(Src) 98.3 F (36.8 C) (Oral)  Wt 149 lb (67.586 kg)  SpO2 96% Well-developed well-nourished in no acute distress HEENT normocephalic TMs clear eyes PERRLA EOMs full. Nares no significant congestion OP; low lying palate no lesions noted. Neck supple without adenopathy. Chest clear to auscultation unlabored respiration Cardiac S1-S2 there is a 2/6 somewhat harsh but very short systolic ejection murmur at the left upper sternal border I think it does not radiate into the neck heard best in the supine position very faint in the upright position I believe that diastole  is clear  Nl  pulses Abdomen:  Sof,t normal bowel sounds without hepatosplenomegaly, no guarding rebound or masses no CVA tenderness No clubbing cyanosis or edema Neurologically grossly intact.    Assessment & Plan:   Snoring Daytime fatigue possible sleep apnea patient  did not check laboratory tests today because apparently they have been done before however if persistent we can for anemia thyroid other metabolic. Decreased libido Suspicious for sleep-disordered breathing or sleep apnea type disorder. As the cause Systolic murmur  at her this one before unsure if this could be anxiety stress.  There is a family history of heart valvular disease don't expect it to be related to her fatigue but we should get a cardiac echo to evaluate.

## 2011-12-14 NOTE — Patient Instructions (Addendum)
I think that your fatigue sleepiness and snoring could be consistent with sleep apnea or sleep disorder  We'll do a referral to sleep specialist  Also I do hear a short systolic murmur on the upper part of her chest. This could be a flow murmur but since you have not had anemia and thyroid problems we will plan an echocardiogram. This can be the valve areas. If this is normal we will just follow.   we'll contact you about both of these referrals    Sleep Apnea Sleep apnea is a common disorder. The main problem of this disorder is excessive daytime sleepiness and compromised quality of life. This may include social and emotional problems. There are two types of sleep apnea.  Obstructive sleep apnea is when breathing stops due to a blocked airway.   Central sleep apnea is a malfunction of the brain's normal signal to breathe.  SYMPTOMS  Restless sleep.   Falling asleep while driving and/or during the day.   Loss of energy.   Irritability.   Mood or behavior changes.   Loud, heavy snoring.   Morning headaches.   Trouble concentrating.   Forgetfulness.   Anxiety or depression.   Decreased interest in sex.  Not all people with sleep apnea have all of these symptoms. However, people who have a few of these symptoms should visit their caregiver for an evaluation. Problems related to untreated sleep apnea include:  High blood pressure (hypertension).   Coronary artery disease.   Impotence.   Cognitive dysfunction.   Memory loss.  TREATMENT  For mild cases, treatment may include avoiding sleeping on one's back.   For people with nasal congestion, a decongestant may be prescribed.   Patients with obstructive and central apnea should avoid depressants. This includes alcohol, sedatives and narcotics. Weight loss and diet control are encouraged for overweight patients.   Many serious cases of obstructive sleep apnea can be relieved by a treatment called nasal continuous  positive airway pressure (nasal CPAP). Nasal CPAP uses a mask-like device and pump that work together to keep the airway open. The pump delivers air pressure during each breath.   Surgery may help some patients by stopping or reducing the narrowing of the airway due to anatomical defects.  PROGNOSIS  Removing the obstruction usually reverses hypertension and cardiac problems. Untreated, sleep apnea sufferers have a tendency to fall asleep during the day. This is can result in serious accident or loss of ones job. RESEARCH Sleep apnea is currently one of the most active areas of sleep research.  Document Released: 07/08/2002 Document Revised: 07/07/2011 Document Reviewed: 11/03/2005 [facility] Patient Information 2012 [place], [place].  Heart Murmur A heart murmur is an extra sound heard by your caregiver when listening to your heart with a device called a stethoscope. The sound might be a "hum" or "whoosh" sound heard when the heart beats. The sound comes from turbulence when blood flows through the heart. There are two types of heart murmurs:  Innocent (Harmless) murmurs: Most people with this type of heart murmur do not have signs or symptoms of heart problems. Many children have innocent heart murmurs. When an innocent heart murmur is found, there is no need to get tests or do treatment. Also, there is no need to restrict activities or stop playing sports. Innocent heart murmurs may be caused by many things. For example, it might be caused by a tiny hole or defect in the wall of the heart. These defects often close as a  child grows. An innocent heart murmur may be heard by an examining clinician throughout your life. If you see a new caregiver, please let him or her know this was found during past exams.   Abnormal murmurs: May have signs and symptoms of heart problems. These types of murmurs can occur in children and adults. In children, abnormal heart murmurs are typically caused from heart defects  that are present at birth. In adults, abnormal murmurs are usually from heart valve problems caused by disease, infection, or aging.  SYMPTOMS   Innocent (Harmless) murmurs do not cause symptoms or require you to limit physical activity.   Many people with abnormal murmurs may or may not have symptoms. If symptoms do develop, they might include:   Shortness of breath.   Blue coloring of the skin, especially on the fingertips.   Chest pain.   Palpitations or feeling a "fluttering" or a "skipped" heart beat.   Fainting.   Persistent cough.   Getting tired much faster than expected.  DIAGNOSIS  A heart murmur might be heard during a pre-sports physical or during any type of examination. When a murmur is heard, it may suggest a possible problem. When this happens, your caregiver may ask you to see a heart specialist (cardiologist). You may also be asked to undergo one or more heart tests. In these cases, testing may vary depending upon what your caregiver heard. Tests for a heart murmur might include one or more of the following:  EKG (electrocardiogram).   Echocardiogram.   Cardiac MRI.  For children and adults who have an abnormal heart murmur and want to play sports, it is important to complete testing, review test results, and receive recommendations from your caregiver. If heart disease is present, it may be risky to play. Finding out the results of your test Not all test results are available during your visit. If your test results are not back during the visit, make an appointment with your caregiver to find out the results. Do not assume everything is normal if you have not heard from your caregiver or the medical facility. It is important for you to follow up on all of your test results.  TREATMENT  As noted above, innocent (harmless) murmurs require no treatment or activity restriction. If the murmur represents a problem with the heart, treatment will depend upon the exact  nature of the problem. In these cases, medicine or surgery may be needed to treat the problem. HOME CARE INSTRUCTIONS If you want to participate in sports or other types of strenuous physical activity, it is important to discuss this first with your caregiver. If the murmur represents a problem with the heart and you choose to participate in sports, there is a small chance that a serious problem (including sudden death) could result.  SEEK MEDICAL CARE IF:   You feel that your symptoms are slowly worsening.   You develop any new symptoms that cause concern.   You feel that you are having side effects from any medicines prescribed.  SEEK IMMEDIATE MEDICAL CARE IF:   Chest pain develops.   You are short of breath.   You notice that your heart beats irregularly often enough to cause you to worry.   You have fainting spells.   There is a worsening of any problems that brought you or your child in for medical care.  Document Released: 08/25/2004 Document Revised: 07/07/2011 Document Reviewed: 09/25/2007 [facility] Patient Information 2012 [place], [place].

## 2011-12-20 ENCOUNTER — Ambulatory Visit (HOSPITAL_COMMUNITY): Payer: BC Managed Care – PPO | Attending: Cardiology

## 2011-12-20 ENCOUNTER — Other Ambulatory Visit: Payer: Self-pay

## 2011-12-20 DIAGNOSIS — R5383 Other fatigue: Secondary | ICD-10-CM

## 2011-12-20 DIAGNOSIS — R5381 Other malaise: Secondary | ICD-10-CM | POA: Insufficient documentation

## 2011-12-20 DIAGNOSIS — R011 Cardiac murmur, unspecified: Secondary | ICD-10-CM | POA: Insufficient documentation

## 2011-12-20 DIAGNOSIS — R0683 Snoring: Secondary | ICD-10-CM

## 2011-12-20 DIAGNOSIS — G4730 Sleep apnea, unspecified: Secondary | ICD-10-CM

## 2011-12-20 DIAGNOSIS — I079 Rheumatic tricuspid valve disease, unspecified: Secondary | ICD-10-CM | POA: Insufficient documentation

## 2011-12-27 ENCOUNTER — Telehealth: Payer: Self-pay | Admitting: *Deleted

## 2011-12-27 NOTE — Telephone Encounter (Signed)
Returning [place] call again

## 2011-12-27 NOTE — Telephone Encounter (Signed)
Requesting echo results.

## 2011-12-27 NOTE — Progress Notes (Signed)
Quick Note:  Called and spoke with pt and pt is aware and would like referral to cardiology. ______

## 2011-12-28 NOTE — Telephone Encounter (Signed)
Pt given echo results on 12/27/11.

## 2012-01-05 ENCOUNTER — Encounter: Payer: Self-pay | Admitting: Pulmonary Disease

## 2012-01-05 ENCOUNTER — Ambulatory Visit (INDEPENDENT_AMBULATORY_CARE_PROVIDER_SITE_OTHER): Payer: BC Managed Care – PPO | Admitting: Pulmonary Disease

## 2012-01-05 VITALS — BP 102/78 | HR 86 | Temp 98.0°F | Ht 59.0 in | Wt 148.2 lb

## 2012-01-05 DIAGNOSIS — R0683 Snoring: Secondary | ICD-10-CM

## 2012-01-05 DIAGNOSIS — R0609 Other forms of dyspnea: Secondary | ICD-10-CM

## 2012-01-05 NOTE — Progress Notes (Signed)
  Subjective:    Patient ID: [name], female    DOB: 04/06/1961, 51 y.o.   MRN: [id number]  HPI The patient is a 51 year old female who I've been asked to see for loud snoring.  She has been noted by her husband to have significant snoring to the point that he sometimes has to sleep in another room.  He has not ever noticed an abnormal breathing pattern during sleep.  The patient has very few awakenings during the night, but 80% of the mornings does not feel completely rested.  She denies any issues during the day with sleepiness, concentration, or cognition.  She feels that her alertness during the day is normal.  She has no issues watching television or reading in the evenings because of sleepiness, and rarely has sleepiness while driving longer distances.  She states that her weight is up about 5 pounds over the last 2 years, and her Epworth score today is only one.  Sleep Questionnaire: What time do you typically go to bed?( Between what hours) 10-11 pm How long does it take you to fall asleep? immediately How many times during the night do you wake up? 1 What time do you get out of bed to start your day? 0600 Do you drive or operate heavy machinery in your occupation? No How much has your weight changed (up or down) over the past two years? (In pounds) 5 lb (2.268 kg) Have you ever had a sleep study before? No Do you currently use CPAP? No Do you wear oxygen at any time? No    Review of Systems  Constitutional: Negative.  Negative for fever and unexpected weight change.  HENT: Negative.  Negative for ear pain, nosebleeds, congestion, sore throat, rhinorrhea, sneezing, trouble swallowing, dental problem, postnasal drip and sinus pressure.   Eyes: Negative.  Negative for redness and itching.  Respiratory: Negative.  Negative for cough, chest tightness, shortness of breath and wheezing.   Cardiovascular: Negative.  Negative for palpitations and leg swelling.  Gastrointestinal: Negative.   Negative for nausea and vomiting.  Genitourinary: Negative.  Negative for dysuria.  Musculoskeletal: Negative.  Negative for joint swelling.  Skin: Negative.  Negative for rash.  Neurological: Negative.  Negative for headaches.  Hematological: Negative.  Does not bruise/bleed easily.  Psychiatric/Behavioral: Negative.  Negative for dysphoric mood. The patient is not nervous/anxious.        Objective:   Physical Exam Constitutional:  Overweight female, no acute distress  HENT:  Nares patent without discharge, but septal deviation noted.  +turbinate hypertrophy  Oropharynx without exudate, palate elongated, small posterior space   Eyes:  Perrla, eomi, no scleral icterus  Neck:  No JVD, no TMG  Cardiovascular:  Normal rate, regular rhythm, no rubs or gallops.  No murmurs        Intact distal pulses  Pulmonary :  Normal breath sounds, no stridor or respiratory distress   No rales, rhonchi, or wheezing  Abdominal:  Soft, nondistended, bowel sounds present.  No tenderness noted.   Musculoskeletal:  No lower extremity edema noted.  Lymph Nodes:  No cervical lymphadenopathy noted  Skin:  No cyanosis noted  Neurologic:  Alert, appropriate, moves all 4 extremities without obvious deficit.         Assessment & Plan:

## 2012-01-05 NOTE — Patient Instructions (Signed)
Will try nasonex 2 sprays each nostril each am for the next 2 weeks.  If helps, can call in prescription for you. Work on weight loss, this is the key. Can consider dental appliance for snoring or possibly surgery. followup with me as needed, but call if snoring worsening and associated with observed breathing issues during sleep.

## 2012-01-05 NOTE — Assessment & Plan Note (Signed)
The patient has mild snoring, however the husband has not seen an abnormal breathing pattern during sleep.  Although the patient is not completely rested in the mornings upon arising, she feels that her alertness level as well her concentration and cognition are normal during the day.  She has no issues staying awake at night while watching television, and no sleepiness with driving.  She has no comorbid medical conditions that can be adversely affected by sleep disordered breathing.  My suspicion for clinically significant sleep apnea is very low, and I would not pursue a sleep study at this time.  I have outlined.  Treatment for her snoring, including a trial of topical nasal steroids to reduce her turbinate hypertrophy, possible nasal septal reconstruction with you UP3, weight loss, and finally a dental appliance.  The patient would like to work aggressively on weight loss, and see if we can improve her nasal airway.  She is to call if she has worsening symptoms.

## 2012-06-25 ENCOUNTER — Encounter: Payer: Self-pay | Admitting: Internal Medicine

## 2012-06-25 ENCOUNTER — Ambulatory Visit (INDEPENDENT_AMBULATORY_CARE_PROVIDER_SITE_OTHER): Payer: BC Managed Care – PPO | Admitting: Internal Medicine

## 2012-06-25 VITALS — BP 150/60 | HR 105 | Temp 98.2°F | Wt 155.0 lb

## 2012-06-25 DIAGNOSIS — J309 Allergic rhinitis, unspecified: Secondary | ICD-10-CM | POA: Insufficient documentation

## 2012-06-25 DIAGNOSIS — R030 Elevated blood-pressure reading, without diagnosis of hypertension: Secondary | ICD-10-CM

## 2012-06-25 MED ORDER — FLUTICASONE PROPIONATE 50 MCG/ACT NA SUSP: NASAL | Status: DC

## 2012-06-25 NOTE — Patient Instructions (Addendum)
This acts like an allergic sinusitis or drainage. Begin nasal cortisone and take 2 sprays each nostril every day for control. Should see improvement in a week  Or so. But continue nose spray after better to continue control. Contact us if fever or severe sx or pain.
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
## 2012-09-20 ENCOUNTER — Other Ambulatory Visit: Payer: Self-pay | Admitting: Gynecology

## 2012-09-20 DIAGNOSIS — Z1231 Encounter for screening mammogram for malignant neoplasm of breast: Secondary | ICD-10-CM

## 2012-09-26 ENCOUNTER — Ambulatory Visit (HOSPITAL_COMMUNITY)
Admission: RE | Admit: 2012-09-26 | Discharge: 2012-09-26 | Disposition: A | Discharge status: Home / Self Care | Payer: BC Managed Care – PPO | Source: Ambulatory Visit | Attending: Gynecology | Admitting: Gynecology

## 2012-09-26 DIAGNOSIS — Z1231 Encounter for screening mammogram for malignant neoplasm of breast: Secondary | ICD-10-CM

## 2013-05-06 ENCOUNTER — Telehealth: Payer: Self-pay | Admitting: Internal Medicine

## 2013-05-06 ENCOUNTER — Encounter: Payer: Self-pay | Admitting: Family Medicine

## 2013-05-06 ENCOUNTER — Ambulatory Visit (INDEPENDENT_AMBULATORY_CARE_PROVIDER_SITE_OTHER): Payer: PRIVATE HEALTH INSURANCE | Admitting: Family Medicine

## 2013-05-06 VITALS — BP 132/82 | HR 90 | Temp 97.8°F | Wt 153.0 lb

## 2013-05-06 DIAGNOSIS — R21 Rash and other nonspecific skin eruption: Secondary | ICD-10-CM

## 2013-05-06 DIAGNOSIS — M549 Dorsalgia, unspecified: Secondary | ICD-10-CM

## 2013-05-06 DIAGNOSIS — M546 Pain in thoracic spine: Secondary | ICD-10-CM

## 2013-05-06 MED ORDER — METHOCARBAMOL 500 MG PO TABS: 500.0000 mg | ORAL_TABLET | Freq: Three times a day (TID) | ORAL | Status: DC | PRN

## 2013-05-06 MED ORDER — TRIAMCINOLONE ACETONIDE 0.1 % EX CREA: TOPICAL_CREAM | Freq: Two times a day (BID) | CUTANEOUS | Status: DC
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
## 2013-05-06 NOTE — Telephone Encounter (Signed)
Patient Information:  Caller Name: Shalunda  Phone: [phone]  Patient: [name], [name]  Gender: Female  DOB: 1961-04-14  Age: 52 Years  PCP: Berniece Andreas [facility])  Pregnant: No  Office Follow Up:  Does the office need to follow up with this patient?: No  Instructions For The Office: N/A  RN Note:  Menopausal. Denies injuries or trauma.  Neck pain rated 5/10 but does not interfer with activities or wake her from sleep. Has to lowers head to brush hair. Carries purse and laptop on right side. Also has itchy, non healing rash on left lower leg after working in garden.  Symptoms  Reason For Call & Symptoms: Right sided neck pain for 1 week  Reviewed Health History In EMR: Yes  Reviewed Medications In EMR: Yes  Reviewed Allergies In EMR: Yes  Reviewed Surgeries / Procedures: Yes  Date of Onset of Symptoms: 04/29/2013  Treatments Tried: 2 Aleve BID  Treatments Tried Worked: Yes OB / GYN:  LMP: Unknown  Guideline(s) Used:  Neck Pain or Stiffness  Disposition Per Guideline:   See Today in Office  Reason For Disposition Reached:   Patient wants to be seen  Advice Given:  Reassurance:  Prolonged turning of the head or working in an awkward position can cause muscle pain in the back of the neck. With treatment, the pain usually resolves in 1 to 2 weeks.  Apply Cold to the Area Or Heat:   During the first 2 days after a mild injury, apply a cold pack or an ice bag (wrapped in a towel) for 20 minutes four times a day. After 2 days, apply a heating pad or hot water bottle to the most painful area for 20 minutes whenever the pain flares up. Wrap hot water bottles or heating pads in a towel to avoid burns.  Sleep:  Sleep on your back or side, not on your abdomen.  Sleep with a neck collar. Use a foam neck collar (from a pharmacy) OR a small towel wrapped around the neck (Reason: keep the head from moving too much during sleep).  Pain Medicines:  For pain relief, you can  take either acetaminophen, ibuprofen, or naproxen.  Naproxen (e.g., Aleve):  Take 220 mg (one 220 mg pill) by mouth every 8 hours as needed. You may take 440 mg (two 220 mg pills) for your first dose.  The most you should take each day is 660 mg (three 220 mg pills a day), unless your doctor has told you to take more.  Extra Notes :  Use the lowest amount of medicine that makes your pain feel better.  Call Back If:  Numbness or weakness occurs  Bowel or bladder problems occur  Pain lasts for more than 2 weeks  You become worse.  RN Overrode Recommendation:  Make Appointment  agrees to be seen  Appointment Scheduled:  05/06/2013 16:30:00 Appointment Scheduled Provider:  Evelena Peat [facility])

## 2013-05-06 NOTE — Telephone Encounter (Signed)
Appt scheduled on 05/06/13 with BB.

## 2013-05-06 NOTE — Telephone Encounter (Signed)
Appt made

## 2013-05-06 NOTE — Telephone Encounter (Signed)
Patient calling into Triage line with complaint of neck pain x1 week. Attempted to call back at  [phone]. No answer . Voicemail reached .Advised to call back to the office for assistance if needed.  NO CONTACT

## 2013-05-15 ENCOUNTER — Ambulatory Visit (INDEPENDENT_AMBULATORY_CARE_PROVIDER_SITE_OTHER): Payer: PRIVATE HEALTH INSURANCE

## 2013-05-15 DIAGNOSIS — Z23 Encounter for immunization: Secondary | ICD-10-CM

## 2013-05-20 ENCOUNTER — Other Ambulatory Visit: Payer: Self-pay | Admitting: Family Medicine

## 2013-05-20 MED ORDER — ATOVAQUONE-PROGUANIL HCL 250-100 MG PO TABS: ORAL_TABLET | ORAL | Status: DC

## 2013-08-12 ENCOUNTER — Ambulatory Visit (INDEPENDENT_AMBULATORY_CARE_PROVIDER_SITE_OTHER): Payer: PRIVATE HEALTH INSURANCE | Admitting: Internal Medicine

## 2013-08-12 ENCOUNTER — Encounter: Payer: Self-pay | Admitting: Internal Medicine

## 2013-08-12 VITALS — BP 150/100 | HR 89 | Temp 98.1°F | Wt 153.0 lb

## 2013-08-12 DIAGNOSIS — R030 Elevated blood-pressure reading, without diagnosis of hypertension: Secondary | ICD-10-CM

## 2013-08-12 DIAGNOSIS — J069 Acute upper respiratory infection, unspecified: Secondary | ICD-10-CM

## 2013-08-12 DIAGNOSIS — J029 Acute pharyngitis, unspecified: Secondary | ICD-10-CM

## 2013-08-12 LAB — POCT RAPID STREP A (OFFICE): RAPID STREP A SCREEN: NEGATIVE

## 2013-08-12 NOTE — Patient Instructions (Addendum)
Strep test culture pending  This could be a new viral infection and has to run its course   Gargles and  Tylenol as needed Also stay on the protonix medication in case reflux is contributing.  You received a broad spectrum antibiotic in [place] that should have cured most bacterial respiratory infections.  At this time would not add another antibiotic   Check your blood pressure when better to make sure  Below 140/90  Sore Throat A sore throat is pain, burning, irritation, or scratchiness of the throat. There is often pain or tenderness when swallowing or talking. A sore throat may be accompanied by other symptoms, such as coughing, sneezing, fever, and swollen neck glands. A sore throat is often the first sign of another sickness, such as a cold, flu, strep throat, or mononucleosis (commonly known as mono). Most sore throats go away without medical treatment. CAUSES  The most common causes of a sore throat include:  A viral infection, such as a cold, flu, or mono.  A bacterial infection, such as strep throat, tonsillitis, or whooping cough.  Seasonal allergies.  Dryness in the air.  Irritants, such as smoke or pollution.  Gastroesophageal reflux disease (GERD). HOME CARE INSTRUCTIONS   Only take over-the-counter medicines as directed by your caregiver.  Drink enough fluids to keep your urine clear or pale yellow.  Rest as needed.  Try using throat sprays, lozenges, or sucking on hard candy to ease any pain (if older than 4 years or as directed).  Sip warm liquids, such as broth, herbal tea, or warm water with honey to relieve pain temporarily. You may also eat or drink cold or frozen liquids such as frozen ice pops.  Gargle with salt water (mix 1 tsp salt with 8 oz of water).  Do not smoke and avoid secondhand smoke.  Put a cool-mist humidifier in your bedroom at night to moisten the air. You can also turn on a hot shower and sit in the bathroom with the door closed for 5 10  minutes. SEEK IMMEDIATE MEDICAL CARE IF:  You have difficulty breathing.  You are unable to swallow fluids, soft foods, or your saliva.  You have increased swelling in the throat.  Your sore throat does not get better in 7 days.  You have nausea and vomiting.  You have a fever or persistent symptoms for more than 2 3 days.  You have a fever and your symptoms suddenly get worse. MAKE SURE YOU:   Understand these instructions.  Will watch your condition.  Will get help right away if you are not doing well or get worse. Document Released: 08/25/2004 Document Revised: 07/04/2012 Document Reviewed: 03/25/2012 [facility] Patient Information 2014 [place], [place].
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
## 2013-08-14 LAB — CULTURE, GROUP A STREP: ORGANISM ID, BACTERIA: NORMAL

## 2013-08-16 DIAGNOSIS — J029 Acute pharyngitis, unspecified: Secondary | ICD-10-CM | POA: Insufficient documentation

## 2013-08-16 DIAGNOSIS — J069 Acute upper respiratory infection, unspecified: Secondary | ICD-10-CM | POA: Insufficient documentation

## 2013-08-19 ENCOUNTER — Encounter: Payer: Self-pay | Admitting: Family Medicine

## 2013-08-20 ENCOUNTER — Telehealth: Payer: Self-pay | Admitting: Internal Medicine

## 2013-08-20 NOTE — Telephone Encounter (Signed)
Patient notified of normal results.

## 2013-08-20 NOTE — Telephone Encounter (Signed)
Pt would like to know the results of her strep culture.  Please call pt at work however if not available please try on home phone and leave detailed message there.  Thanks.

## 2013-08-22 ENCOUNTER — Encounter: Payer: Self-pay | Admitting: Internal Medicine

## 2013-08-22 ENCOUNTER — Ambulatory Visit (INDEPENDENT_AMBULATORY_CARE_PROVIDER_SITE_OTHER): Payer: PRIVATE HEALTH INSURANCE | Admitting: Internal Medicine

## 2013-08-22 VITALS — BP 140/90 | HR 105 | Temp 98.5°F | Resp 20 | Ht 59.0 in | Wt 151.0 lb

## 2013-08-22 DIAGNOSIS — R5383 Other fatigue: Secondary | ICD-10-CM

## 2013-08-22 DIAGNOSIS — J029 Acute pharyngitis, unspecified: Secondary | ICD-10-CM

## 2013-08-22 DIAGNOSIS — J45909 Unspecified asthma, uncomplicated: Secondary | ICD-10-CM

## 2013-08-22 DIAGNOSIS — J069 Acute upper respiratory infection, unspecified: Secondary | ICD-10-CM

## 2013-08-22 DIAGNOSIS — R5381 Other malaise: Secondary | ICD-10-CM

## 2013-08-22 MED ORDER — HYDROCODONE-HOMATROPINE 5-1.5 MG/5ML PO SYRP: 5.0000 mL | ORAL_SOLUTION | ORAL | Status: AC | PRN

## 2013-08-22 MED ORDER — PREDNISONE 20 MG PO TABS: ORAL_TABLET | ORAL | Status: DC

## 2013-08-22 MED ORDER — AZITHROMYCIN 250 MG PO TABS
ORAL_TABLET | ORAL | Status: DC
Start: 2013-08-22 — End: 2013-08-26
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
## 2013-08-22 NOTE — Patient Instructions (Signed)
Take over-the-counter expectorants and cough medications such as  Mucinex DM.  Call if there is no improvement in 5 to 7 days or if he developed worsening cough, fever, or new symptoms, such as shortness of breath or chest pain. 

## 2013-08-22 NOTE — Progress Notes (Signed)
Pre-visit discussion using our clinic review tool. No additional management support is needed unless otherwise documented below in the visit note.  

## 2013-08-26 ENCOUNTER — Ambulatory Visit (INDEPENDENT_AMBULATORY_CARE_PROVIDER_SITE_OTHER): Payer: PRIVATE HEALTH INSURANCE | Admitting: Internal Medicine

## 2013-08-26 ENCOUNTER — Encounter: Payer: Self-pay | Admitting: Internal Medicine

## 2013-08-26 VITALS — BP 160/90 | HR 88 | Temp 98.3°F | Resp 20 | Ht 59.0 in | Wt 153.0 lb

## 2013-08-26 DIAGNOSIS — J309 Allergic rhinitis, unspecified: Secondary | ICD-10-CM

## 2013-08-26 DIAGNOSIS — J069 Acute upper respiratory infection, unspecified: Secondary | ICD-10-CM

## 2013-08-26 DIAGNOSIS — J45909 Unspecified asthma, uncomplicated: Secondary | ICD-10-CM

## 2013-08-26 NOTE — Progress Notes (Signed)
Pre-visit discussion using our clinic review tool. No additional management support is needed unless otherwise documented below in the visit note.  

## 2013-08-26 NOTE — Patient Instructions (Signed)
Pulmicort 2 puffs twice daily   Take over-the-counter expectorants and cough medications such as  Mucinex DM.  Call if there is no improvement in 5 to 7 days or if he developed worsening cough, fever, or new symptoms, such as shortness of breath or chest pain.

## 2013-09-02 ENCOUNTER — Encounter: Payer: Self-pay | Admitting: Internal Medicine
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
## 2013-09-10 ENCOUNTER — Encounter: Payer: Self-pay | Admitting: Gynecology

## 2013-10-04 ENCOUNTER — Encounter: Payer: Self-pay | Admitting: Gynecology

## 2013-10-14 ENCOUNTER — Encounter: Payer: Self-pay | Admitting: Gynecology

## 2013-11-05 ENCOUNTER — Other Ambulatory Visit: Payer: Self-pay | Admitting: Gynecology

## 2013-11-05 DIAGNOSIS — Z1231 Encounter for screening mammogram for malignant neoplasm of breast: Secondary | ICD-10-CM

## 2013-11-07 ENCOUNTER — Ambulatory Visit (HOSPITAL_COMMUNITY)
Admission: RE | Admit: 2013-11-07 | Discharge: 2013-11-07 | Disposition: A | Discharge status: Home / Self Care | Payer: PRIVATE HEALTH INSURANCE | Source: Ambulatory Visit | Attending: Gynecology | Admitting: Gynecology

## 2013-11-07 DIAGNOSIS — Z1231 Encounter for screening mammogram for malignant neoplasm of breast: Secondary | ICD-10-CM | POA: Insufficient documentation

## 2013-11-11 ENCOUNTER — Ambulatory Visit (INDEPENDENT_AMBULATORY_CARE_PROVIDER_SITE_OTHER): Payer: PRIVATE HEALTH INSURANCE | Admitting: Gynecology

## 2013-11-11 ENCOUNTER — Encounter: Payer: Self-pay | Admitting: Gynecology

## 2013-11-11 ENCOUNTER — Other Ambulatory Visit (HOSPITAL_COMMUNITY)
Admission: RE | Admit: 2013-11-11 | Discharge: 2013-11-11 | Disposition: A | Discharge status: Home / Self Care | Payer: PRIVATE HEALTH INSURANCE | Source: Ambulatory Visit | Attending: Gynecology | Admitting: Gynecology

## 2013-11-11 VITALS — BP 138/86 | Ht <= 58 in | Wt 151.0 lb

## 2013-11-11 DIAGNOSIS — Z1151 Encounter for screening for human papillomavirus (HPV): Secondary | ICD-10-CM | POA: Insufficient documentation

## 2013-11-11 DIAGNOSIS — Z780 Asymptomatic menopausal state: Secondary | ICD-10-CM

## 2013-11-11 DIAGNOSIS — N951 Menopausal and female climacteric states: Secondary | ICD-10-CM

## 2013-11-11 DIAGNOSIS — Z862 Personal history of diseases of the blood and blood-forming organs and certain disorders involving the immune mechanism: Secondary | ICD-10-CM

## 2013-11-11 DIAGNOSIS — N952 Postmenopausal atrophic vaginitis: Secondary | ICD-10-CM

## 2013-11-11 DIAGNOSIS — Z01419 Encounter for gynecological examination (general) (routine) without abnormal findings: Secondary | ICD-10-CM | POA: Insufficient documentation

## 2013-11-11 DIAGNOSIS — Z1159 Encounter for screening for other viral diseases: Secondary | ICD-10-CM

## 2013-11-11 DIAGNOSIS — Z8639 Personal history of other endocrine, nutritional and metabolic disease: Secondary | ICD-10-CM

## 2013-11-11 LAB — URINALYSIS W MICROSCOPIC + REFLEX CULTURE
Bilirubin Urine: NEGATIVE
Casts: NONE SEEN
Crystals: NONE SEEN
Glucose, UA: NEGATIVE mg/dL
HGB URINE DIPSTICK: NEGATIVE
Ketones, ur: NEGATIVE mg/dL
Nitrite: NEGATIVE
PROTEIN: NEGATIVE mg/dL
RBC / HPF: NONE SEEN RBC/hpf (ref <3)
Specific Gravity, Urine: 1.015 (ref 1.005–1.030)
UROBILINOGEN UA: 0.2 mg/dL (ref 0.0–1.0)
pH: 6 (ref 5.0–8.0)

## 2013-11-11 MED ORDER — NONFORMULARY OR COMPOUNDED ITEM: Status: DC

## 2013-11-11 NOTE — Patient Instructions (Signed)
Colonoscopy A colonoscopy is an exam to look at the entire large intestine (colon). This exam can help find problems such as tumors, polyps, inflammation, and areas of bleeding. The exam takes about 1 hour.  LET [facility] CARE PROVIDER KNOW ABOUT:   Any allergies you have.  All medicines you are taking, including vitamins, herbs, eye drops, creams, and over-the-counter medicines.  Previous problems you or members of your family have had with the use of anesthetics.  Any blood disorders you have.  Previous surgeries you have had.  Medical conditions you have. RISKS AND COMPLICATIONS  Generally, this is a safe procedure. However, as with any procedure, complications can occur. Possible complications include:  Bleeding.  Tearing or rupture of the colon wall.  Reaction to medicines given during the exam.  Infection (rare). BEFORE THE PROCEDURE   Ask your health care provider about changing or stopping your regular medicines.  You may be prescribed an oral bowel prep. This involves drinking a large amount of medicated liquid, starting the day before your procedure. The liquid will cause you to have multiple loose stools until your stool is almost clear or light green. This cleans out your colon in preparation for the procedure.  Do not eat or drink anything else once you have started the bowel prep, unless your health care provider tells you it is safe to do so.  Arrange for someone to drive you home after the procedure. PROCEDURE   You will be given medicine to help you relax (sedative).  You will lie on your side with your knees bent.  A long, flexible tube with a light and camera on the end (colonoscope) will be inserted through the rectum and into the colon. The camera sends video back to a computer screen as it moves through the colon. The colonoscope also releases carbon dioxide gas to inflate the colon. This helps your health care provider see the area better.  During  the exam, your health care provider may take a small tissue sample (biopsy) to be examined under a microscope if any abnormalities are found.  The exam is finished when the entire colon has been viewed. AFTER THE PROCEDURE   Do not drive for 24 hours after the exam.  You may have a small amount of blood in your stool.  You may pass moderate amounts of gas and have mild abdominal cramping or bloating. This is caused by the gas used to inflate your colon during the exam.  Ask when your test results will be ready and how you will get your results. Make sure you get your test results. Document Released: 07/15/2000 Document Revised: 05/08/2013 Document Reviewed: 03/25/2013 [facility] Patient Information 2014 [place]. Bone Densitometry Bone densitometry is a special X-ray that measures your bone density and can be used to help predict your risk of bone fractures. This test is used to determine bone mineral content and density to diagnose osteoporosis. Osteoporosis is the loss of bone that may cause the bone to become weak. Osteoporosis commonly occurs in women entering menopause. However, it may be found in men and in people with other diseases. PREPARATION FOR TEST No preparation necessary. WHO SHOULD BE TESTED?  All women older than 38.  Postmenopausal women (50 to 53) with risk factors for osteoporosis.  People with a previous fracture caused by normal activities.  People with a small body frame (less than 127 poundsor a body mass index [BMI] of less than 21).  People who have a parent  with a hip fracture or history of osteoporosis.  People who smoke.  People who have rheumatoid arthritis.  Anyone who engages in excessive alcohol use (more than 3 drinks most days).  Women who experience early menopause. WHEN SHOULD YOU BE RETESTED? Current guidelines suggest that you should wait at least 2 years before doing a bone density test again if your first test was normal.Recent  studies indicated that women with normal bone density may be able to wait a few years before needing to repeat a bone density test. You should discuss this with your caregiver.  NORMAL FINDINGS   Normal: less than standard deviation below normal (greater than -1).  Osteopenia: 1 to 2.5 standard deviations below normal (-1 to -2.5).  Osteoporosis: greater than 2.5 standard deviations below normal (less than -2.5). Test results are reported as a "T score" and a "Z score."The T score is a number that compares your bone density with the bone density of healthy, young women.The Z score is a number that compares your bone density with the scores of women who are the same age, gender, and race.  Ranges for normal findings may vary among different laboratories and hospitals. You should always check with your doctor after having lab work or other tests done to discuss the meaning of your test results and whether your values are considered within normal limits. MEANING OF TEST  Your caregiver will go over the test results with you and discuss the importance and meaning of your results, as well as treatment options and the need for additional tests if necessary. OBTAINING THE TEST RESULTS It is your responsibility to obtain your test results. Ask the lab or department performing the test when and how you will get your results. Document Released: 08/09/2004 Document Revised: 10/10/2011 Document Reviewed: 09/01/2010 [facility] Patient Information 2014 [place].

## 2013-11-11 NOTE — Progress Notes (Signed)
[name] [birth year] [id number]   History:    53 y.o.  for annual gyn exam who has not been seen in the office since 2004. Patient has had history of hyperlipidemia in the past but currently on no medication has not been seen by her PCP and several years. Patient is menopausal with vaginal atrophy but no vasomotor symptoms reported. She still has not had a colonoscopy yet. Patient with last bone density study in 2010 with the lowest T score right femoral neck -1.1.   Past medical history,surgical history, family history and social history were all reviewed and documented in the EPIC chart.  Gynecologic History No LMP recorded. Patient is postmenopausal. Contraception: post menopausal status Last Pap: 2012. Results were: normal Last mammogram: 2015. Results were: normal  Obstetric History OB History  Gravida Para Term Preterm AB SAB TAB Ectopic Multiple Living  2 2 2       2     # Outcome Date GA Lbr Len/2nd Weight Sex Delivery Anes PTL Lv  2 TRM     M CS  N Y  1 TRM     F CS  N Y       ROS: A ROS was performed and pertinent positives and negatives are included in the history.  GENERAL: No fevers or chills. HEENT: No change in vision, no earache, sore throat or sinus congestion. NECK: No pain or stiffness. CARDIOVASCULAR: No chest pain or pressure. No palpitations. PULMONARY: No shortness of breath, cough or wheeze. GASTROINTESTINAL: No abdominal pain, nausea, vomiting or diarrhea, melena or bright red blood per rectum. GENITOURINARY: No urinary frequency, urgency, hesitancy or dysuria. MUSCULOSKELETAL: No joint or muscle pain, no back pain, no recent trauma. DERMATOLOGIC: No rash, no itching, no lesions. ENDOCRINE: No polyuria, polydipsia, no heat or cold intolerance. No recent change in weight. HEMATOLOGICAL: No anemia or easy bruising or bleeding. NEUROLOGIC: No headache, seizures, numbness, tingling or weakness. PSYCHIATRIC: No depression, no loss of interest in normal  activity or change in sleep pattern.     Exam: chaperone present  BP 138/86  Ht 4\' 10"  (1.473 m)  Wt 151 lb (68.493 kg)  BMI 31.57 kg/m2  Body mass index is 31.57 kg/(m^2).  General appearance : Well developed well nourished female. No acute distress HEENT: Neck supple, trachea midline, no carotid bruits, no thyroidmegaly Lungs: Clear to auscultation, no rhonchi or wheezes, or rib retractions  Heart: Regular rate and rhythm, no murmurs or gallops Breast:Examined in sitting and supine position were symmetrical in appearance, no palpable masses or tenderness,  no skin retraction, no nipple inversion, no nipple discharge, no skin discoloration, no axillary or supraclavicular lymphadenopathy Abdomen: no palpable masses or tenderness, no rebound or guarding Extremities: no edema or skin discoloration or tenderness  Pelvic:  Bartholin, Urethra, Skene Glands: Within normal limits             Vagina: No gross lesions or discharge, vaginal atrophy  Cervix: No gross lesions or discharge  Uterus  axial, normal size, shape and consistency, non-tender and mobile  Adnexa  Without masses or tenderness  Anus and perineum  normal   Rectovaginal  normal sphincter tone without palpated masses or tenderness             Hemoccult refuse rectal exam we'll schedule colonoscopy this year     Assessment/Plan:  53 y.o. female for annual exam post menopausal has not had colonoscopy yet. Patient will be given the names of local gastroenterologist for her  to schedule colonoscopy. She was scheduled bone density study here in the office in the next few weeks. She will return back to the office in the fasting state and the following labs will be ordered: CBC, fasting lipid profile, TSH, comprehensive metabolic panel, and prolactin level. Urinalysis today demonstrated 7-10 wbc and few bacteria a culture will be run. Patient was given a prescription for vaginal estrogen to apply twice a week for vaginal atrophy.  Risks benefits and pros and cons were discussed. We discussed importance of calcium vitamin D and regular exercise for osteoporosis prevention. Also discussed importance of monthly breast exams..   New CDC guidelines is recommending patients be tested once in her lifetime for hepatitis C antibody who were born between [birth year] through 1967. This was discussed with the patient today and has agreed to be tested today.  Note: This dictation was prepared with  Dragon/digital dictation along withSmart phrase technology. Any transcriptional errors that result from this process are unintentional.   Terrance Mass MD, 5:32 PM 11/11/2013

## 2013-11-12 ENCOUNTER — Encounter (HOSPITAL_COMMUNITY): Payer: Self-pay | Admitting: Emergency Medicine

## 2013-11-12 ENCOUNTER — Emergency Department (HOSPITAL_COMMUNITY): Payer: Worker's Compensation

## 2013-11-12 ENCOUNTER — Emergency Department (HOSPITAL_COMMUNITY)
Admission: EM | Admit: 2013-11-12 | Discharge: 2013-11-12 | Disposition: A | Discharge status: Home / Self Care | Payer: Worker's Compensation | Attending: Emergency Medicine | Admitting: Emergency Medicine

## 2013-11-12 DIAGNOSIS — I10 Essential (primary) hypertension: Secondary | ICD-10-CM | POA: Insufficient documentation

## 2013-11-12 DIAGNOSIS — M949 Disorder of cartilage, unspecified: Secondary | ICD-10-CM

## 2013-11-12 DIAGNOSIS — Z8639 Personal history of other endocrine, nutritional and metabolic disease: Secondary | ICD-10-CM | POA: Insufficient documentation

## 2013-11-12 DIAGNOSIS — Y9301 Activity, walking, marching and hiking: Secondary | ICD-10-CM | POA: Insufficient documentation

## 2013-11-12 DIAGNOSIS — S8001XA Contusion of right knee, initial encounter: Secondary | ICD-10-CM

## 2013-11-12 DIAGNOSIS — W010XXA Fall on same level from slipping, tripping and stumbling without subsequent striking against object, initial encounter: Secondary | ICD-10-CM | POA: Insufficient documentation

## 2013-11-12 DIAGNOSIS — S8000XA Contusion of unspecified knee, initial encounter: Secondary | ICD-10-CM | POA: Insufficient documentation

## 2013-11-12 DIAGNOSIS — Z79899 Other long term (current) drug therapy: Secondary | ICD-10-CM | POA: Insufficient documentation

## 2013-11-12 DIAGNOSIS — IMO0002 Reserved for concepts with insufficient information to code with codable children: Secondary | ICD-10-CM | POA: Insufficient documentation

## 2013-11-12 DIAGNOSIS — Z8659 Personal history of other mental and behavioral disorders: Secondary | ICD-10-CM | POA: Insufficient documentation

## 2013-11-12 DIAGNOSIS — Z862 Personal history of diseases of the blood and blood-forming organs and certain disorders involving the immune mechanism: Secondary | ICD-10-CM | POA: Insufficient documentation

## 2013-11-12 DIAGNOSIS — S93409A Sprain of unspecified ligament of unspecified ankle, initial encounter: Secondary | ICD-10-CM | POA: Insufficient documentation

## 2013-11-12 DIAGNOSIS — X500XXA Overexertion from strenuous movement or load, initial encounter: Secondary | ICD-10-CM | POA: Insufficient documentation

## 2013-11-12 DIAGNOSIS — S93402A Sprain of unspecified ligament of left ankle, initial encounter: Secondary | ICD-10-CM

## 2013-11-12 DIAGNOSIS — Z8709 Personal history of other diseases of the respiratory system: Secondary | ICD-10-CM | POA: Insufficient documentation

## 2013-11-12 DIAGNOSIS — Y9289 Other specified places as the place of occurrence of the external cause: Secondary | ICD-10-CM | POA: Insufficient documentation

## 2013-11-12 DIAGNOSIS — M899 Disorder of bone, unspecified: Secondary | ICD-10-CM | POA: Insufficient documentation

## 2013-11-12 MED ORDER — NAPROXEN 500 MG PO TABS: 500.0000 mg | ORAL_TABLET | Freq: Two times a day (BID) | ORAL | Status: DC
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
## 2013-11-12 NOTE — ED Notes (Signed)
Pt states she walked outside and slipped and fell in the rain. Pt c/o L ankle pain and R knee pain. Pt has taken Aleve x 2 and used ice to ankle PTA. Pt ambulatory to exam room with steady gait.

## 2013-11-12 NOTE — Discharge Instructions (Signed)
X-rays look normal. Keep ankle and knee elevated. Ice. Naprosyn for pain. Follow up with primary care doctor or an orthopedics specialist.    Ankle Sprain An ankle sprain is an injury to the strong, fibrous tissues (ligaments) that hold the bones of your ankle joint together.  CAUSES An ankle sprain is usually caused by a fall or by twisting your ankle. Ankle sprains most commonly occur when you step on the outer edge of your foot, and your ankle turns inward. People who participate in sports are more prone to these types of injuries.  SYMPTOMS   Pain in your ankle. The pain may be present at rest or only when you are trying to stand or walk.  Swelling.  Bruising. Bruising may develop immediately or within 1 to 2 days after your injury.  Difficulty standing or walking, particularly when turning corners or changing directions. DIAGNOSIS  Your caregiver will ask you details about your injury and perform a physical exam of your ankle to determine if you have an ankle sprain. During the physical exam, your caregiver will press on and apply pressure to specific areas of your foot and ankle. Your caregiver will try to move your ankle in certain ways. An X-ray exam may be done to be sure a bone was not broken or a ligament did not separate from one of the bones in your ankle (avulsion fracture).  TREATMENT  Certain types of braces can help stabilize your ankle. Your caregiver can make a recommendation for this. Your caregiver may recommend the use of medicine for pain. If your sprain is severe, your caregiver may refer you to a surgeon who helps to restore function to parts of your skeletal system (orthopedist) or a physical therapist. [place] ice to your injury for 1 2 days or as directed by your caregiver. Applying ice helps to reduce inflammation and pain.  Put ice in a plastic bag.  Place a towel between your skin and the bag.  Leave the ice on for 15-20 minutes at a  time, every 2 hours while you are awake.  Only take over-the-counter or prescription medicines for pain, discomfort, or fever as directed by your caregiver.  Elevate your injured ankle above the level of your heart as much as possible for 2 3 days.  If your caregiver recommends crutches, use them as instructed. Gradually put weight on the affected ankle. Continue to use crutches or a cane until you can walk without feeling pain in your ankle.  If you have a plaster splint, wear the splint as directed by your caregiver. Do not rest it on anything harder than a pillow for the first 24 hours. Do not put weight on it. Do not get it wet. You may take it off to take a shower or bath.  You may have been given an elastic bandage to wear around your ankle to provide support. If the elastic bandage is too tight (you have numbness or tingling in your foot or your foot becomes cold and blue), adjust the bandage to make it comfortable.  If you have an air splint, you may blow more air into it or let air out to make it more comfortable. You may take your splint off at night and before taking a shower or bath. Wiggle your toes in the splint several times per day to decrease swelling. SEEK MEDICAL CARE IF:   You have rapidly increasing bruising or swelling.  Your toes feel extremely cold  or you lose feeling in your foot.  Your pain is not relieved with medicine. SEEK IMMEDIATE MEDICAL CARE IF:  Your toes are numb or blue.  You have severe pain that is increasing. MAKE SURE YOU:   Understand these instructions.  Will watch your condition.  Will get help right away if you are not doing well or get worse. Document Released: 07/18/2005 Document Revised: 04/11/2012 Document Reviewed: 07/30/2011 [facility] Patient Information 2014 [place], [place].

## 2013-11-13 LAB — URINE CULTURE
COLONY COUNT: NO GROWTH
ORGANISM ID, BACTERIA: NO GROWTH

## 2013-11-14 NOTE — ED Provider Notes (Signed)
Medical screening examination/treatment/procedure(s) were performed by non-physician practitioner and as supervising physician I was immediately available for consultation/collaboration.   EKG Interpretation None        Ephraim Hamburger, MD 11/14/13 1549

## 2013-11-21 ENCOUNTER — Encounter (HOSPITAL_COMMUNITY): Payer: Self-pay | Admitting: Emergency Medicine

## 2013-11-21 ENCOUNTER — Emergency Department (INDEPENDENT_AMBULATORY_CARE_PROVIDER_SITE_OTHER)
Admission: EM | Admit: 2013-11-21 | Discharge: 2013-11-21 | Disposition: A | Discharge status: Home / Self Care | Payer: Worker's Compensation | Source: Home / Self Care | Attending: Family Medicine | Admitting: Family Medicine

## 2013-11-21 DIAGNOSIS — W19XXXA Unspecified fall, initial encounter: Secondary | ICD-10-CM

## 2013-11-21 DIAGNOSIS — IMO0002 Reserved for concepts with insufficient information to code with codable children: Secondary | ICD-10-CM

## 2013-11-21 DIAGNOSIS — S93402A Sprain of unspecified ligament of left ankle, initial encounter: Secondary | ICD-10-CM

## 2013-11-21 DIAGNOSIS — S93409A Sprain of unspecified ligament of unspecified ankle, initial encounter: Secondary | ICD-10-CM

## 2013-11-21 DIAGNOSIS — M7040 Prepatellar bursitis, unspecified knee: Secondary | ICD-10-CM

## 2013-11-21 DIAGNOSIS — M7041 Prepatellar bursitis, right knee: Secondary | ICD-10-CM

## 2013-11-21 DIAGNOSIS — S80211A Abrasion, right knee, initial encounter: Secondary | ICD-10-CM

## 2013-11-21 MED ORDER — MUPIROCIN 2 % EX OINT: 1.0000 "application " | TOPICAL_OINTMENT | Freq: Two times a day (BID) | CUTANEOUS | Status: DC

## 2013-11-21 NOTE — ED Notes (Signed)
Patient states fell on 11/12/2013 and injured her left ankle and right knee Complains of still having pain to her ankle and is concerned the cut on her knee Might be infected

## 2013-11-21 NOTE — Discharge Instructions (Signed)
Thank you for coming in today. Followup with Dr. Farris Has at [facility].  Stop using Neosporin.  Start Bactroban ointment.  Do exercises  Acute Ankle Sprain with Phase I Rehab An acute ankle sprain is a partial or complete tear in one or more of the ligaments of the ankle due to traumatic injury. The severity of the injury depends on both the the number of ligaments sprained and the grade of sprain. There are 3 grades of sprains.   A grade 1 sprain is a mild sprain. There is a slight pull without obvious tearing. There is no loss of strength, and the muscle and ligament are the correct length.  A grade 2 sprain is a moderate sprain. There is tearing of fibers within the substance of the ligament where it connects two bones or two cartilages. The length of the ligament is increased, and there is usually decreased strength.  A grade 3 sprain is a complete rupture of the ligament and is uncommon. In addition to the grade of sprain, there are three types of ankle sprains.  Lateral ankle sprains: This is a sprain of one or more of the three ligaments on the outer side (lateral) of the ankle. These are the most common sprains. Medial ankle sprains: There is one large triangular ligament of the inner side (medial) of the ankle that is susceptible to injury. Medial ankle sprains are less common. Syndesmosis, "high ankle," sprains: The syndesmosis is the ligament that connects the two bones of the lower leg. Syndesmosis sprains usually only occur with very severe ankle sprains. SYMPTOMS  Pain, tenderness, and swelling in the ankle, starting at the side of injury that may progress to the whole ankle and foot with time.  "Pop" or tearing sensation at the time of injury.  Bruising that may spread to the heel.  Impaired ability to walk soon after injury. CAUSES   Acute ankle sprains are caused by trauma placed on the ankle that temporarily forces or pries the anklebone (talus) out of  its normal socket.  Stretching or tearing of the ligaments that normally hold the joint in place (usually due to a twisting injury). RISK INCREASES WITH:  Previous ankle sprain.  Sports in which the foot may land awkwardly (ie. basketball, volleyball, or soccer) or walking or running on uneven or rough surfaces.  Shoes with inadequate support to prevent sideways motion when stress occurs.  Poor strength and flexibility.  Poor balance skills.  Contact sports. PREVENTION   Warm up and stretch properly before activity.  Maintain physical fitness:  Ankle and leg flexibility, muscle strength, and endurance.  Cardiovascular fitness.  Balance training activities.  Use proper technique and have a coach correct improper technique.  Taping, protective strapping, bracing, or high-top tennis shoes may help prevent injury. Initially, tape is best; however, it loses most of its support function within 10 to 15 minutes.  Wear proper fitted protective shoes (High-top shoes with taping or bracing is more effective than either alone).  Provide the ankle with support during sports and practice activities for 12 months following injury. PROGNOSIS   If treated properly, ankle sprains can be expected to recover completely; however, the length of recovery depends on the degree of injury.  A grade 1 sprain usually heals enough in 5 to 7 days to allow modified activity and requires an average of 6 weeks to heal completely.  A grade 2 sprain requires 6 to 10 weeks to heal completely.  A grade 3 sprain  requires 12 to 16 weeks to heal.  A syndesmosis sprain often takes more than 3 months to heal. RELATED COMPLICATIONS   Frequent recurrence of symptoms may result in a chronic problem. Appropriately addressing the problem the first time decreases the frequency of recurrence and optimizes healing time. Severity of the initial sprain does not predict the likelihood of later instability.  Injury to  other structures (bone, cartilage, or tendon).  A chronically unstable or arthritic ankle joint is a possiblity with repeated sprains. TREATMENT Treatment initially involves the use of ice, medication, and compression bandages to help reduce pain and inflammation. Ankle sprains are usually immobilized in a walking cast or boot to allow for healing. Crutches may be recommended to reduce pressure on the injury. After immobilization, strengthening and stretching exercises may be necessary to regain strength and a full range of motion. Surgery is rarely needed to treat ankle sprains. MEDICATION   Nonsteroidal anti-inflammatory medications, such as aspirin and ibuprofen (do not take for the first 3 days after injury or within 7 days before surgery), or other minor pain relievers, such as acetaminophen, are often recommended. Take these as directed by your caregiver. Contact your caregiver immediately if any bleeding, stomach upset, or signs of an allergic reaction occur from these medications.  Ointments applied to the skin may be helpful.  Pain relievers may be prescribed as necessary by your caregiver. Do not take prescription pain medication for longer than 4 to 7 days. Use only as directed and only as much as you need. HEAT AND COLD  Cold treatment (icing) is used to relieve pain and reduce inflammation for acute and chronic cases. Cold should be applied for 10 to 15 minutes every 2 to 3 hours for inflammation and pain and immediately after any activity that aggravates your symptoms. Use ice packs or an ice massage.  Heat treatment may be used before performing stretching and strengthening activities prescribed by your caregiver. Use a heat pack or a warm soak. SEEK IMMEDIATE MEDICAL CARE IF:   Pain, swelling, or bruising worsens despite treatment.  You experience pain, numbness, discoloration, or coldness in the foot or toes.  New, unexplained symptoms develop (drugs used in treatment may  produce side effects.) EXERCISES  PHASE I EXERCISES RANGE OF MOTION (ROM) AND STRETCHING EXERCISES - Ankle Sprain, Acute Phase I, Weeks 1 to 2 These exercises may help you when beginning to restore flexibility in your ankle. You will likely work on these exercises for the 1 to 2 weeks after your injury. Once your physician, physical therapist, or athletic trainer sees adequate progress, he or she will advance your exercises. While completing these exercises, remember:   Restoring tissue flexibility helps normal motion to return to the joints. This allows healthier, less painful movement and activity.  An effective stretch should be held for at least 30 seconds.  A stretch should never be painful. You should only feel a gentle lengthening or release in the stretched tissue. RANGE OF MOTION - Dorsi/Plantar Flexion  While sitting with your right / left knee straight, draw the top of your foot upwards by flexing your ankle. Then reverse the motion, pointing your toes downward.  Hold each position for __________ seconds.  After completing your first set of exercises, repeat this exercise with your knee bent. Repeat __________ times. Complete this exercise __________ times per day.  RANGE OF MOTION - Ankle Alphabet  Imagine your right / left big toe is a pen.  Keeping your hip and knee  still, write out the entire alphabet with your "pen." Make the letters as large as you can without increasing any discomfort. Repeat __________ times. Complete this exercise __________ times per day.  STRENGTHENING EXERCISES - Ankle Sprain, Acute -Phase I, Weeks 1 to 2 These exercises may help you when beginning to restore strength in your ankle. You will likely work on these exercises for 1 to 2 weeks after your injury. Once your physician, physical therapist, or athletic trainer sees adequate progress, he or she will advance your exercises. While completing these exercises, remember:   Muscles can gain both the  endurance and the strength needed for everyday activities through controlled exercises.  Complete these exercises as instructed by your physician, physical therapist, or athletic trainer. Progress the resistance and repetitions only as guided.  You may experience muscle soreness or fatigue, but the pain or discomfort you are trying to eliminate should never worsen during these exercises. If this pain does worsen, stop and make certain you are following the directions exactly. If the pain is still present after adjustments, discontinue the exercise until you can discuss the trouble with your clinician. STRENGTH - Dorsiflexors  Secure a rubber exercise band/tubing to a fixed object (ie. table, pole) and loop the other end around your right / left foot.  Sit on the floor facing the fixed object. The band/tubing should be slightly tense when your foot is relaxed.  Slowly draw your foot back toward you using your ankle and toes.  Hold this position for __________ seconds. Slowly release the tension in the band and return your foot to the starting position. Repeat __________ times. Complete this exercise __________ times per day.  STRENGTH - Plantar-flexors   Sit with your right / left leg extended. Holding onto both ends of a rubber exercise band/tubing, loop it around the ball of your foot. Keep a slight tension in the band.  Slowly push your toes away from you, pointing them downward.  Hold this position for __________ seconds. Return slowly, controlling the tension in the band/tubing. Repeat __________ times. Complete this exercise __________ times per day.  STRENGTH - Ankle Eversion  Secure one end of a rubber exercise band/tubing to a fixed object (table, pole). Loop the other end around your foot just before your toes.  Place your fists between your knees. This will focus your strengthening at your ankle.  Drawing the band/tubing across your opposite foot, slowly, pull your little toe  out and up. Make sure the band/tubing is positioned to resist the entire motion.  Hold this position for __________ seconds. Have your muscles resist the band/tubing as it slowly pulls your foot back to the starting position.  Repeat __________ times. Complete this exercise __________ times per day.  STRENGTH - Ankle Inversion  Secure one end of a rubber exercise band/tubing to a fixed object (table, pole). Loop the other end around your foot just before your toes.  Place your fists between your knees. This will focus your strengthening at your ankle.  Slowly, pull your big toe up and in, making sure the band/tubing is positioned to resist the entire motion.  Hold this position for __________ seconds.  Have your muscles resist the band/tubing as it slowly pulls your foot back to the starting position. Repeat __________ times. Complete this exercises __________ times per day.  STRENGTH - Towel Curls  Sit in a chair positioned on a non-carpeted surface.  Place your right / left foot on a towel, keeping your heel on  the floor.  Pull the towel toward your heel by only curling your toes. Keep your heel on the floor.  If instructed by your physician, physical therapist, or athletic trainer, add weight to the end of the towel. Repeat __________ times. Complete this exercise __________ times per day. Document Released: 02/16/2005 Document Revised: 10/10/2011 Document Reviewed: 10/30/2008 [facility] Patient Information 2014 [place], [place].  Acute Ankle Sprain with Phase II Rehab An acute ankle sprain is a partial or complete tear in one or more of the ligaments of the ankle due to traumatic injury. The severity of the injury depends on both the number of ligaments sprained and the grade of sprain. There are 3 grades of sprains.  A grade 1 sprain is a mild sprain. There is a slight pull without obvious tearing. There is no loss of strength, and the muscle and ligament are the correct  length.  A grade 2 sprain is a moderate sprain. There is tearing of fibers within the substance of the ligament where it connects two bones or two cartilages. The length of the ligament is increased, and there is usually decreased strength.  A grade 3 sprain is a complete rupture of the ligament and is uncommon. In addition to the grade of sprain, there are 3 types of ankle sprains.  Lateral ankle sprains. This is a sprain of one or more of the 3 ligaments on the outer side (lateral) of the ankle. These are the most common sprains. Medial ankle sprains. There is one large triangular ligament on the inner side (medial) of the ankle that is susceptible to injury. Medial ankle sprains are less common. Syndesmosis, "high ankle," sprains. The syndesmosis is the ligament that connects the two bones of the lower leg. Syndesmosis sprains usually only occur with very severe ankle sprains. SYMPTOMS  Pain, tenderness, and swelling in the ankle, starting at the side of injury that may progress to the whole ankle and foot with time.  "Pop" or tearing sensation at the time of injury.  Bruising that may spread to the heel.  Impaired ability to walk soon after injury. CAUSES   Acute ankle sprains are caused by trauma placed on the ankle that temporarily forces or pries the anklebone (talus) out of its normal socket.  Stretching or tearing of the ligaments that normally hold the joint in place (usually due to a twisting injury). RISK INCREASES WITH:  Previous ankle sprain.  Sports in which the foot may land awkwardly (basketball, volleyball, soccer) or walking or running on uneven or rough surfaces.  Shoes with inadequate support to prevent sideways motion when stress occurs.  Poor strength and flexibility.  Poor balance skills.  Contact sports. PREVENTION  Warm up and stretch properly before activity.  Maintain physical fitness:  Ankle and leg flexibility, muscle strength, and  endurance.  Cardiovascular fitness.  Balance training activities.  Use proper technique and have a coach correct improper technique.  Taping, protective strapping, bracing, or high-top tennis shoes may help prevent injury. Initially, tape is best. However, it loses most of its support function within 10 to 15 minutes.  Wear proper fitted protective shoes. Combining high-top shoes with taping or bracing is more effective than using either alone.  Provide the ankle with support during sports and practice activities for 12 months following injury. PROGNOSIS   If treated properly, ankle sprains can be expected to recover completely. However, the length of recovery depends on the degree of injury.  A grade 1 sprain usually heals enough  in 5 to 7 days to allow modified activity and requires an average of 6 weeks to heal completely.  A grade 2 sprain requires 6 to 10 weeks to heal completely.  A grade 3 sprain requires 12 to 16 weeks to heal.  A syndesmosis sprain often takes more than 3 months to heal. RELATED COMPLICATIONS   Frequent recurrence of symptoms may result in a chronic problem. Appropriately addressing the problem the first time decreases the frequency of recurrence and optimizes healing time. Severity of initial sprain does not predict the likelihood of later instability.  Injury to other structures (bone, cartilage, or tendon).  Chronically unstable or arthritic ankle joint are possible with repeated sprains. TREATMENT Treatment initially involves the use of ice, medicine, and compression bandages to help reduce pain and inflammation. Ankle sprains are usually immobilized in a walking cast or boot to allow for healing. Crutches may be recommended to reduce pressure on the injury. After immobilization, strengthening and stretching exercises may be necessary to regain strength and a full range of motion. Surgery is rarely needed to treat ankle sprains. MEDICATION    Nonsteroidal anti-inflammatory medicines, such as aspirin and ibuprofen (do not take for the first 3 days after injury or within 7 days before surgery), or other minor pain relievers, such as acetaminophen, are often recommended. Take these as directed by your caregiver. Contact your caregiver immediately if any bleeding, stomach upset, or signs of an allergic reaction occur from these medicines.  Ointments applied to the skin may be helpful.  Pain relievers may be prescribed as necessary by your caregiver. Do not take prescription pain medicine for longer than 4 to 7 days. Use only as directed and only as much as you need. HEAT AND COLD  Cold treatment (icing) is used to relieve pain and reduce inflammation for acute and chronic cases. Cold should be applied for 10 to 15 minutes every 2 to 3 hours for inflammation and pain and immediately after any activity that aggravates your symptoms. Use ice packs or an ice massage.  Heat treatment may be used before performing stretching and strengthening activities prescribed by your caregiver. Use a heat pack or a warm soak. SEEK IMMEDIATE MEDICAL CARE IF:   Pain, swelling, or bruising worsens despite treatment.  You experience pain, numbness, discoloration, or coldness in the foot or toes.  New, unexplained symptoms develop. (Drugs used in treatment may produce side effects.) EXERCISES  PHASE II EXERCISES RANGE OF MOTION (ROM) AND STRETCHING EXERCISES - Ankle Sprain, Acute-Phase II, Weeks 3 to 4 After your physician, physical therapist, or athletic trainer feels your knee has made progress significant enough to begin more advanced exercises, he or she may recommend completing some of the following exercises. Although each person heals at different rates, most people will be ready for these exercises between 3 and 4 weeks after their injury. Do not begin these exercises until you have your caregiver's permission. He or she may also advise you to  continue with the exercises which you completed in Phase I of your rehabilitation. While completing these exercises, remember:   Restoring tissue flexibility helps normal motion to return to the joints. This allows healthier, less painful movement and activity.  An effective stretch should be held for at least 30 seconds.  A stretch should never be painful. You should only feel a gentle lengthening or release in the stretched tissue. RANGE OF MOTION - Ankle Plantar Flexion   Sit with your right / left leg crossed over  your opposite knee.  Use your opposite hand to pull the top of your foot and toes toward you.  You should feel a gentle stretch on the top of your foot/ankle. Hold this position for __________. Repeat __________ times. Complete __________ times per day.  RANGE OF MOTION - Ankle Eversion  Sit with your right / left ankle crossed over your opposite knee.  Grip your foot with your opposite hand, placing your thumb on the top of your foot and your fingers across the bottom of your foot.  Gently push your foot downward with a slight rotation so your littlest toes rise slightly  You should feel a gentle stretch on the inside of your ankle. Hold the stretch for __________ seconds. Repeat __________ times. Complete this exercise __________ times per day.  RANGE OF MOTION - Ankle Inversion  Sit with your right / left ankle crossed over your opposite knee.  Grip your foot with your opposite hand, placing your thumb on the bottom of your foot and your fingers across the top of your foot.  Gently pull your foot so the smallest toe comes toward you and your thumb pushes the inside of the ball of your foot away from you.  You should feel a gentle stretch on the outside of your ankle. Hold the stretch for __________ seconds. Repeat __________ times. Complete this exercise __________ times per day.  STRETCH - Gastrocsoleus  Sit with your right / left leg extended. Holding onto both  ends of a belt or towel, loop it around the ball of your foot.  Keeping your right / left ankle and foot relaxed and your knee straight, pull your foot and ankle toward you using the belt/towel.  You should feel a gentle stretch behind your calf or knee. Hold this position for __________ seconds. Repeat __________ times. Complete this stretch __________ times per day.  RANGE OF MOTION - Ankle Dorsiflexion, Active Assisted  Remove shoes and sit on a chair that is preferably not on a carpeted surface.  Place right / left foot under knee. Extend your opposite leg for support.  Keeping your heel down, slide your right / left foot back toward the chair until you feel a stretch at your ankle or calf. If you do not feel a stretch, slide your bottom forward to the edge of the chair while still keeping your heel down.  Hold this stretch for __________ seconds. Repeat __________ times. Complete this stretch __________ times per day.  STRETCH  Gastroc, Standing   Place hands on wall.  Extend right / left leg and place a folded washcloth under the arch of your foot for support. Keep the front knee somewhat bent.  Slightly point your toes inward on your back foot.  Keeping your right / left heel on the floor and your knee straight, shift your weight toward the wall, not allowing your back to arch.  You should feel a gentle stretch in the calf. Hold this position for __________ seconds. Repeat __________ times. Complete this stretch __________ times per day. STRETCH  Soleus, Standing  Place hands on wall.  Extend right / left leg and place a folded washcloth under the arch of your foot for support. Keep the front knee somewhat bent.  Slightly point your toes inward on your back foot.  Keep your right / left heel on the floor, bend your back knee, and slightly shift your weight over the back leg so that you feel a gentle stretch deep in your  back calf.  Hold this position for __________  seconds. Repeat __________ times. Complete this stretch __________ times per day. STRETCH  Gastrocsoleus, Standing Note: This exercise can place a lot of stress on your foot and ankle. Please complete this exercise only if specifically instructed by your caregiver.   Place the ball of your right / left foot on a step, keeping your other foot firmly on the same step.  Hold on to the wall or a rail for balance.  Slowly lift your other foot, allowing your body weight to press your heel down over the edge of the step.  You should feel a stretch in your right / left calf.  Hold this position for __________ seconds.  Repeat this exercise with a slight bend in your knee. Repeat __________ times. Complete this stretch __________ times per day.  STRENGTHENING EXERCISES - Ankle Sprain, Acute-Phase II Around 3 to 4 weeks after your injury, you may progress to some of these exercises in your rehabilitation program. Do not begin these until you have your caregiver's permission. Although your condition has improved, the Phase I exercises will continue to be helpful and you may continue to complete them. As you complete strengthening exercises, remember:   Strong muscles with good endurance tolerate stress better.  Do the exercises as initially prescribed by your caregiver. Progress slowly with each exercise, gradually increasing the number of repetitions and weight used under his or her guidance.  You may experience muscle soreness or fatigue, but the pain or discomfort you are trying to eliminate should never worsen during these exercises. If this pain does worsen, stop and make certain you are following the directions exactly. If the pain is still present after adjustments, discontinue the exercise until you can discuss the trouble with your caregiver. STRENGTH - Plantar-flexors, Standing  Stand with your feet shoulder width apart. Steady yourself with a wall or table using as little support as  needed.  Keeping your weight evenly spread over the width of your feet, rise up on your toes.*  Hold this position for __________ seconds. Repeat __________ times. Complete this exercise __________ times per day.  *If this is too easy, shift your weight toward your right / left leg until you feel challenged. Ultimately, you may be asked to do this exercise with your right / left foot only. STRENGTH  Dorsiflexors and Plantar-flexors, Heel/toe Walking  Dorsiflexion: Walk on your heels only. Keep your toes as high as possible.  Walk for ____________________ seconds/feet.  Repeat __________ times. Complete __________ times per day.  Plantar flexion: Walk on your toes only. Keep your heels as high as possible.  Walk for ____________________ seconds/feet. Repeat __________ times. Complete __________ times per day.  BALANCE  Tandem Walking  Place your uninjured foot on a line 2 to 4 inches wide and at least 10 feet long.  Keeping your balance without using anything for extra support, place your right / left heel directly in front of your other foot.  Slowly raise your back foot up, lifting from the heel to the toes, and place it directly in front of the right / left foot.  Continue to walk along the line slowly. Walk for ____________________ feet. Repeat ____________________ times. Complete ____________________ times per day. BALANCE - Inversion/Eversion Use caution, these are advanced level exercises. Do not begin them until you are advised to do so.   Create a balance board using a sturdy board about 1  feet long and at 1 to 1  feet wide and a 1  inch diameter rod or pipe that is as long as the board's width. A copper pipe or a solid broomstick work well.  Stand on a non-carpeted surface near a countertop or wall. Step onto the board so that your feet are hip-width apart and equally straddle the rod/pipe.  Keeping your feet in place, complete these two exercises without shifting  your upper body or hips:  Tip the board from side-to-side. Control the movement so the board does not forcefully strike the ground. The board should silently tap the ground.  Tip the board side-to-side without striking the ground. Occasionally pause and maintain a steady position at various points.  Repeat the first two exercises, but use only your right / left foot. Place your right / left foot directly over the rod/pipe. Repeat __________ times. Complete this exercise __________ times a day. BALANCE - Plantar/Dorsi Flexion Use caution, these are advanced level exercises. Do not begin them until you are advised to do so.   Create a balance board using a sturdy board about 1  feet long and at 1 to 1  feet wide and a 1  inch diameter rod or pipe that is as long as the board's width. A copper pipe or a solid broomstick work well.  Stand on a non-carpeted surface near a countertop or wall. Stand on the board so that the rod/pipe runs under the arches in your feet.  Keeping your feet in place, complete these two exercises without shifting your upper body or hips:  Tip the board from side-to-side. Control the movement so the board does not forcefully strike the ground. The board should silently tap the ground.  Tip the board side-to-side without striking the ground. Occasionally pause and maintain a steady position at various points.  Repeat the first two exercises, but use only your right / left foot. Stand in the center of the board. Repeat __________ times. Complete this exercise __________ times a day. STRENGTH  Plantar-flexors, Eccentric Note: This exercise can place a lot of stress on your foot and ankle. Please complete this exercise only if specifically instructed by your caregiver.   Place the balls of your feet on a step. With your hands, use only enough support from a wall or rail to keep your balance.  Keep your knees straight and rise up on your toes.  Slowly shift your  weight entirely to your toes and pick up your opposite foot. Gently and with controlled movement, lower your weight through your right / left foot so that your heel drops below the level of the step. You will feel a slight stretch in the back of your calf at the ending position.  Use the healthy leg to help rise up onto the balls of both feet, then lower weight only on the right / left leg again. Build up to 15 repetitions. Then progress to 3 consecutive sets of 15 repetitions.*  After completing the above exercise, complete the same exercise with a slight knee bend (about 30 degrees). Again, build up to 15 repetitions. Then progress to 3 consecutive sets of 15 repetitions.* Perform this exercise __________ times per day.  *When you easily complete 3 sets of 15, your physician, physical therapist, or athletic trainer may advise you to add resistance by wearing a backpack filled with additional weight. Document Released: 11/07/2005 Document Revised: 10/10/2011 Document Reviewed: 10/30/2008 [facility] Patient Information 2014 [place], [place].
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
## 2013-11-22 ENCOUNTER — Telehealth: Payer: Self-pay | Admitting: Gynecology

## 2013-11-22 ENCOUNTER — Encounter: Payer: Self-pay | Admitting: Gynecology

## 2013-11-22 NOTE — Telephone Encounter (Signed)
Spoke with Dr. Jovita Gamma neurosurgeon in reference to this patient who has a small incidental falx meningioma noted on MRI several years ago when she was undergoing evaluation of her pituitary gland back in 2007. I had reviewed his note from back in and he had mention that the use of estrogen replacement was to be avoided because a small portion of meningiomas could have estrogen receptors and could exhibit growth with increased estrogen exposure. Patient has been suffering from vaginal atrophy and dyspareunia and we had planned on prescribing her vaginal estrogen low-dose twice a week. The absorption is practically minimal if any. I explained this to the neurosurgeon and he sees no contraindication her problem when these individuals with these meningiomas have been pregnant with the large circulatory estrogen levels have contributed to these meningiomas increasing in size. He did not see any problem with her small meningioma that she's had for many years with the use of low-dose estrogen vaginally twice a week. He did recommend that she make an appointment to see him next year. I have related this information via voice message today.

## 2013-11-25 ENCOUNTER — Encounter: Payer: Self-pay | Admitting: Gynecology

## 2013-12-12 ENCOUNTER — Encounter: Payer: Self-pay | Admitting: Gynecology

## 2014-02-04 ENCOUNTER — Telehealth: Payer: Self-pay | Admitting: Internal Medicine

## 2014-02-04 NOTE — Telephone Encounter (Signed)
Pt will be travelling to [place] and would like written rx for  malaria pills. Pt will be in [place] about 20 days. Pt is leaving on 02-27-14

## 2014-02-06 MED ORDER — ATOVAQUONE-PROGUANIL HCL 250-100 MG PO TABS: ORAL_TABLET | ORAL | Status: DC

## 2014-02-06 NOTE — Telephone Encounter (Signed)
Pt would like a written rx for malaria pills. Pt will pick up rx. Pt would like [facility] that [facility] cancelled

## 2014-02-06 NOTE — Telephone Encounter (Signed)
Medication sent to the pharmacy.  Left a message on phone informing the pt to pick up.

## 2014-02-06 NOTE — Telephone Encounter (Signed)
Malarone?  Take 1 po 2 days before travel and daily until 7 days after return  Disp 30 tabs  ( other option is weekly lariam)

## 2014-02-07 MED ORDER — ATOVAQUONE-PROGUANIL HCL 250-100 MG PO TABS: ORAL_TABLET | ORAL | Status: DC

## 2014-02-07 NOTE — Telephone Encounter (Signed)
Prescription cancelled at [facility].  Pt notified to pick up rx at the front desk.

## 2014-02-07 NOTE — Addendum Note (Signed)
Addended by: Miles Costain T on: 02/07/2014 11:52 AM   Modules accepted: Orders

## 2014-02-11 ENCOUNTER — Other Ambulatory Visit: Payer: Self-pay | Admitting: Family Medicine

## 2014-02-11 MED ORDER — ATOVAQUONE-PROGUANIL HCL 250-100 MG PO TABS: ORAL_TABLET | ORAL | Status: DC

## 2014-02-13 ENCOUNTER — Telehealth: Payer: Self-pay | Admitting: Internal Medicine

## 2014-02-13 NOTE — Telephone Encounter (Signed)
[facility] has ? about malarone

## 2014-02-14 NOTE — Telephone Encounter (Signed)
Pharmacy notified that pt should take 1 daily 2 days prior to travel and for 7 days after return

## 2014-02-28 ENCOUNTER — Ambulatory Visit (INDEPENDENT_AMBULATORY_CARE_PROVIDER_SITE_OTHER): Payer: PRIVATE HEALTH INSURANCE | Admitting: Internal Medicine

## 2014-02-28 ENCOUNTER — Encounter: Payer: Self-pay | Admitting: Internal Medicine

## 2014-02-28 VITALS — BP 124/84 | HR 64 | Temp 98.2°F | Wt 157.0 lb

## 2014-02-28 DIAGNOSIS — J3089 Other allergic rhinitis: Secondary | ICD-10-CM

## 2014-02-28 DIAGNOSIS — L309 Dermatitis, unspecified: Secondary | ICD-10-CM | POA: Insufficient documentation

## 2014-02-28 DIAGNOSIS — L259 Unspecified contact dermatitis, unspecified cause: Secondary | ICD-10-CM

## 2014-02-28 MED ORDER — FLUOCINONIDE-E 0.05 % EX CREA: 1.0000 "application " | TOPICAL_CREAM | Freq: Two times a day (BID) | CUTANEOUS | Status: DC

## 2014-02-28 NOTE — Patient Instructions (Signed)
This looks like eczema or other allergic rash . Cool compresses  dont use the topical benadryl but can take oral bendryl for itching or allergy. Cortisone cream until rash is gone usually about 2 weeks .  Begin your flonase or nasacort nose spray every day   For itchy allergy nose.

## 2014-02-28 NOTE — Progress Notes (Signed)
Pre visit review using our clinic review tool, if applicable. No additional management support is needed unless otherwise documented below in the visit note. 
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
## 2014-04-03 ENCOUNTER — Telehealth: Payer: Self-pay | Admitting: Internal Medicine

## 2014-04-03 NOTE — Telephone Encounter (Signed)
Pt is needing new rx for the shingles vaccine sent to [address]

## 2014-04-04 MED ORDER — VARICELLA-ZOSTER IMMUNE GLOB 125 UNIT/1.2ML IM SOLN: 0.5000 mL | Freq: Once | INTRAMUSCULAR | Status: DC

## 2014-04-04 NOTE — Telephone Encounter (Signed)
Sent to the pharmacy by e-scribe. 

## 2014-04-23 ENCOUNTER — Ambulatory Visit: Payer: PRIVATE HEALTH INSURANCE | Admitting: Physician Assistant

## 2014-04-24 ENCOUNTER — Encounter: Payer: Self-pay | Admitting: Physician Assistant

## 2014-04-24 ENCOUNTER — Ambulatory Visit (INDEPENDENT_AMBULATORY_CARE_PROVIDER_SITE_OTHER): Payer: PRIVATE HEALTH INSURANCE | Admitting: Physician Assistant

## 2014-04-24 VITALS — BP 140/86 | HR 84 | Temp 98.1°F | Resp 18 | Wt 154.0 lb

## 2014-04-24 DIAGNOSIS — L259 Unspecified contact dermatitis, unspecified cause: Secondary | ICD-10-CM

## 2014-04-24 MED ORDER — PREDNISONE 10 MG PO TABS: ORAL_TABLET | ORAL | Status: DC

## 2014-04-24 NOTE — Patient Instructions (Addendum)
Prednisone taper as directed. Take each dose with food to prevent nausea.  You can also use cold compresses or cold running water to help itching symptoms.  If emergency symptoms discussed during visit developed, seek medical attention immediately.  Followup as needed, or for worsening or persistent symptoms despite treatment.    Contact Dermatitis Contact dermatitis is a rash that happens when something touches the skin. You touched something that irritates your skin, or you have allergies to something you touched. HOME CARE   Avoid the thing that caused your rash.  Keep your rash away from hot water, soap, sunlight, chemicals, and other things that might bother it.  Do not scratch your rash.  You can take cool baths to help stop itching.  Only take medicine as told by your doctor.  Keep all doctor visits as told. GET HELP RIGHT AWAY IF:   Your rash is not better after 3 days.  Your rash gets worse.  Your rash is puffy (swollen), tender, red, sore, or warm.  You have problems with your medicine. MAKE SURE YOU:   Understand these instructions.  Will watch your condition.  Will get help right away if you are not doing well or get worse. Document Released: 05/15/2009 Document Revised: 10/10/2011 Document Reviewed: 12/21/2010 [facility] Patient Information 2015 [place], [place]. This information is not intended to replace advice given to you by your health care provider. Make sure you discuss any questions you have with your health care provider.

## 2014-04-24 NOTE — Progress Notes (Signed)
Pre visit review using our clinic review tool, if applicable. No additional management support is needed unless otherwise documented below in the visit note. 
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
## 2014-06-02 ENCOUNTER — Encounter: Payer: Self-pay | Admitting: Physician Assistant

## 2014-12-03 ENCOUNTER — Other Ambulatory Visit: Payer: Self-pay | Admitting: Gynecology

## 2014-12-03 DIAGNOSIS — Z1231 Encounter for screening mammogram for malignant neoplasm of breast: Secondary | ICD-10-CM

## 2014-12-09 ENCOUNTER — Ambulatory Visit (HOSPITAL_COMMUNITY)
Admission: RE | Admit: 2014-12-09 | Discharge: 2014-12-09 | Disposition: A | Discharge status: Home / Self Care | Payer: PRIVATE HEALTH INSURANCE | Source: Ambulatory Visit | Attending: Gynecology | Admitting: Gynecology

## 2014-12-09 DIAGNOSIS — Z1231 Encounter for screening mammogram for malignant neoplasm of breast: Secondary | ICD-10-CM

## 2015-03-17 ENCOUNTER — Encounter: Payer: Self-pay | Admitting: Gynecology

## 2015-03-17 ENCOUNTER — Ambulatory Visit (INDEPENDENT_AMBULATORY_CARE_PROVIDER_SITE_OTHER): Payer: PRIVATE HEALTH INSURANCE | Admitting: Gynecology

## 2015-03-17 VITALS — BP 122/78 | Ht 58.5 in | Wt 159.0 lb

## 2015-03-17 DIAGNOSIS — M8580 Other specified disorders of bone density and structure, unspecified site: Secondary | ICD-10-CM | POA: Diagnosis not present

## 2015-03-17 DIAGNOSIS — Z01419 Encounter for gynecological examination (general) (routine) without abnormal findings: Secondary | ICD-10-CM | POA: Diagnosis not present

## 2015-03-17 DIAGNOSIS — Z8639 Personal history of other endocrine, nutritional and metabolic disease: Secondary | ICD-10-CM | POA: Diagnosis not present

## 2015-03-17 DIAGNOSIS — R635 Abnormal weight gain: Secondary | ICD-10-CM | POA: Diagnosis not present

## 2015-03-17 NOTE — Patient Instructions (Signed)
Bone Densitometry Bone densitometry is a special X-ray that measures your bone density and can be used to help predict your risk of bone fractures. This test is used to determine bone mineral content and density to diagnose osteoporosis. Osteoporosis is the loss of bone that may cause the bone to become weak. Osteoporosis commonly occurs in women entering menopause. However, it may be found in men and in people with other diseases. PREPARATION FOR TEST No preparation necessary. WHO SHOULD BE TESTED?  All women older than 18.  Postmenopausal women (50 to 29) with risk factors for osteoporosis.  People with a previous fracture caused by normal activities.  People with a small body frame (less than 127 poundsor a body mass index [BMI] of less than 21).  People who have a parent with a hip fracture or history of osteoporosis.  People who smoke.  People who have rheumatoid arthritis.  Anyone who engages in excessive alcohol use (more than 3 drinks most days).  Women who experience early menopause. WHEN SHOULD YOU BE RETESTED? Current guidelines suggest that you should wait at least 2 years before doing a bone density test again if your first test was normal.Recent studies indicated that women with normal bone density may be able to wait a few years before needing to repeat a bone density test. You should discuss this with your caregiver.  NORMAL FINDINGS   Normal: less than standard deviation below normal (greater than -1).  Osteopenia: 1 to 2.5 standard deviations below normal (-1 to -2.5).  Osteoporosis: greater than 2.5 standard deviations below normal (less than -2.5). Test results are reported as a "T score" and a "Z score."The T score is a number that compares your bone density with the bone density of healthy, young women.The Z score is a number that compares your bone density with the scores of women who are the same age, gender, and race.  Ranges for normal findings may vary  among different laboratories and hospitals. You should always check with your doctor after having lab work or other tests done to discuss the meaning of your test results and whether your values are considered within normal limits. MEANING OF TEST  Your caregiver will go over the test results with you and discuss the importance and meaning of your results, as well as treatment options and the need for additional tests if necessary. OBTAINING THE TEST RESULTS It is your responsibility to obtain your test results. Ask the lab or department performing the test when and how you will get your results. Document Released: 08/09/2004 Document Revised: 10/10/2011 Document Reviewed: 09/01/2010 [facility] Patient Information 2015 [place], [place]. This information is not intended to replace advice given to you by your health care provider. Make sure you discuss any questions you have with your health care provider. Colonoscopy A colonoscopy is an exam to look at the entire large intestine (colon). This exam can help find problems such as tumors, polyps, inflammation, and areas of bleeding. The exam takes about 1 hour.  LET [facility] CARE PROVIDER KNOW ABOUT:   Any allergies you have.  All medicines you are taking, including vitamins, herbs, eye drops, creams, and over-the-counter medicines.  Previous problems you or members of your family have had with the use of anesthetics.  Any blood disorders you have.  Previous surgeries you have had.  Medical conditions you have. RISKS AND COMPLICATIONS  Generally, this is a safe procedure. However, as with any procedure, complications can occur. Possible complications include:  Bleeding.  Tearing or rupture of the colon wall.  Reaction to medicines given during the exam.  Infection (rare). BEFORE THE PROCEDURE   Ask your health care provider about changing or stopping your regular medicines.  You may be prescribed an oral bowel prep. This involves  drinking a large amount of medicated liquid, starting the day before your procedure. The liquid will cause you to have multiple loose stools until your stool is almost clear or light green. This cleans out your colon in preparation for the procedure.  Do not eat or drink anything else once you have started the bowel prep, unless your health care provider tells you it is safe to do so.  Arrange for someone to drive you home after the procedure. PROCEDURE   You will be given medicine to help you relax (sedative).  You will lie on your side with your knees bent.  A long, flexible tube with a light and camera on the end (colonoscope) will be inserted through the rectum and into the colon. The camera sends video back to a computer screen as it moves through the colon. The colonoscope also releases carbon dioxide gas to inflate the colon. This helps your health care provider see the area better.  During the exam, your health care provider may take a small tissue sample (biopsy) to be examined under a microscope if any abnormalities are found.  The exam is finished when the entire colon has been viewed. AFTER THE PROCEDURE   Do not drive for 24 hours after the exam.  You may have a small amount of blood in your stool.  You may pass moderate amounts of gas and have mild abdominal cramping or bloating. This is caused by the gas used to inflate your colon during the exam.  Ask when your test results will be ready and how you will get your results. Make sure you get your test results. Document Released: 07/15/2000 Document Revised: 05/08/2013 Document Reviewed: 03/25/2013 [facility] Patient Information 2015 [place], [place]. This information is not intended to replace advice given to you by your health care provider. Make sure you discuss any questions you have with your health care provider.

## 2015-03-18 NOTE — Progress Notes (Signed)
[name] 1960/10/21 [id number]   History:    54 y.o.  for annual gyn exam who is only complaint was weight gain.Patient has had history of hyperlipidemia in the past but currently on no medication has not been seen by her PCP and several years. Patient is menopausal with vaginal atrophy but no vasomotor symptoms reported. She still has not had a colonoscopy yet.Patient with last bone density study in 2010 with the lowest T score right femoral neck -1.1. Many years ago she had hyperprolactinemia and was treated and has been off the medication for several years. Patient denies any galactorrhea are unusual headaches or visual disturbances.   Past medical history,surgical history, family history and social history were all reviewed and documented in the EPIC chart.  Gynecologic History No LMP recorded. Patient is postmenopausal. Contraception: post menopausal status Last Pap: 2015. Results were: normal Last mammogram: 2016. Results were: normal  Obstetric History OB History  Gravida Para Term Preterm AB SAB TAB Ectopic Multiple Living  2 2 2       2     # Outcome Date GA Lbr Len/2nd Weight Sex Delivery Anes PTL Lv  2 Term     M CS-Unspec  N Y  1 Term     F CS-Unspec  N Y       ROS: A ROS was performed and pertinent positives and negatives are included in the history.  GENERAL: No fevers or chills. HEENT: No change in vision, no earache, sore throat or sinus congestion. NECK: No pain or stiffness. CARDIOVASCULAR: No chest pain or pressure. No palpitations. PULMONARY: No shortness of breath, cough or wheeze. GASTROINTESTINAL: No abdominal pain, nausea, vomiting or diarrhea, melena or bright red blood per rectum. GENITOURINARY: No urinary frequency, urgency, hesitancy or dysuria. MUSCULOSKELETAL: No joint or muscle pain, no back pain, no recent trauma. DERMATOLOGIC: No rash, no itching, no lesions. ENDOCRINE: No polyuria, polydipsia, no heat or cold intolerance. No recent change in  weight. HEMATOLOGICAL: No anemia or easy bruising or bleeding. NEUROLOGIC: No headache, seizures, numbness, tingling or weakness. PSYCHIATRIC: No depression, no loss of interest in normal activity or change in sleep pattern.     Exam: chaperone present  BP 122/78 mmHg  Ht 4' 10.5" (1.486 m)  Wt 159 lb (72.122 kg)  BMI 32.66 kg/m2  Body mass index is 32.66 kg/(m^2).  General appearance : Well developed well nourished female. No acute distress HEENT: Eyes: no retinal hemorrhage or exudates,  Neck supple, trachea midline, no carotid bruits, no thyroidmegaly Lungs: Clear to auscultation, no rhonchi or wheezes, or rib retractions  Heart: Regular rate and rhythm, no murmurs or gallops Breast:Examined in sitting and supine position were symmetrical in appearance, no palpable masses or tenderness,  no skin retraction, no nipple inversion, no nipple discharge, no skin discoloration, no axillary or supraclavicular lymphadenopathy Abdomen: no palpable masses or tenderness, no rebound or guarding Extremities: no edema or skin discoloration or tenderness  Pelvic:  Bartholin, Urethra, Skene Glands: Within normal limits             Vagina: No gross lesions or discharge  Cervix: No gross lesions or discharge  Uterus  anteverted, normal size, shape and consistency, non-tender and mobile  Adnexa  Without masses or tenderness  Anus and perineum  normal   Rectovaginal  normal sphincter tone without palpated masses or tenderness             Hemoccult cards provided     Assessment/Plan:  54  y.o. female for annual exam who was reminded once again the importance of screening colonoscopy. She was provided with names of regional gastroenterologist for her to schedule appointment. She's also to schedule her bone density study here in the office. We discussed importance of calcium vitamin D and regular exercise for osteoporosis prevention. Patient will return later in the week in a fasting state for the  following screening blood work: Comprehensive metabolic panel, fasting lipid profile, TSH, CBC, and urinalysis. Because of her past history of hyperprolactinemia were going to check a prolactin level as well. She was provided with fecal Hemoccult cards to submit to the office for testing.   Terrance Mass MD, 9:56 AM 03/18/2015

## 2015-04-07 ENCOUNTER — Ambulatory Visit (INDEPENDENT_AMBULATORY_CARE_PROVIDER_SITE_OTHER): Payer: PRIVATE HEALTH INSURANCE

## 2015-04-07 ENCOUNTER — Other Ambulatory Visit: Payer: PRIVATE HEALTH INSURANCE

## 2015-04-07 ENCOUNTER — Other Ambulatory Visit: Payer: Self-pay | Admitting: Gynecology

## 2015-04-07 DIAGNOSIS — Z01419 Encounter for gynecological examination (general) (routine) without abnormal findings: Secondary | ICD-10-CM

## 2015-04-07 DIAGNOSIS — M8580 Other specified disorders of bone density and structure, unspecified site: Secondary | ICD-10-CM | POA: Diagnosis not present

## 2015-04-07 DIAGNOSIS — Z13820 Encounter for screening for osteoporosis: Secondary | ICD-10-CM | POA: Diagnosis not present

## 2015-04-07 DIAGNOSIS — Z8639 Personal history of other endocrine, nutritional and metabolic disease: Secondary | ICD-10-CM

## 2015-04-07 LAB — CBC WITH DIFFERENTIAL/PLATELET
Basophils Absolute: 0.1 10*3/uL (ref 0.0–0.1)
Basophils Relative: 1 % (ref 0–1)
EOS PCT: 3 % (ref 0–5)
Eosinophils Absolute: 0.2 10*3/uL (ref 0.0–0.7)
HCT: 43.3 % (ref 36.0–46.0)
Hemoglobin: 14.2 g/dL (ref 12.0–15.0)
LYMPHS ABS: 2.3 10*3/uL (ref 0.7–4.0)
LYMPHS PCT: 29 % (ref 12–46)
MCH: 28.8 pg (ref 26.0–34.0)
MCHC: 32.8 g/dL (ref 30.0–36.0)
MCV: 87.8 fL (ref 78.0–100.0)
MONO ABS: 0.5 10*3/uL (ref 0.1–1.0)
MPV: 9.3 fL (ref 8.6–12.4)
Monocytes Relative: 7 % (ref 3–12)
Neutro Abs: 4.7 10*3/uL (ref 1.7–7.7)
Neutrophils Relative %: 60 % (ref 43–77)
Platelets: 314 10*3/uL (ref 150–400)
RBC: 4.93 MIL/uL (ref 3.87–5.11)
RDW: 14 % (ref 11.5–15.5)
WBC: 7.8 10*3/uL (ref 4.0–10.5)

## 2015-04-08 LAB — URINALYSIS W MICROSCOPIC + REFLEX CULTURE
Bacteria, UA: NONE SEEN [HPF]
Bilirubin Urine: NEGATIVE
CASTS: NONE SEEN [LPF]
CRYSTALS: NONE SEEN [HPF]
Glucose, UA: NEGATIVE
HGB URINE DIPSTICK: NEGATIVE
NITRITE: NEGATIVE
PH: 5.5 (ref 5.0–8.0)
Protein, ur: NEGATIVE
Specific Gravity, Urine: 1.022 (ref 1.001–1.035)
YEAST: NONE SEEN [HPF]

## 2015-04-08 LAB — LIPID PANEL
CHOL/HDL RATIO: 3.8 ratio (ref <=5.0)
Cholesterol: 226 mg/dL — ABNORMAL HIGH (ref 125–200)
HDL: 60 mg/dL (ref >=46)
LDL Cholesterol: 137 mg/dL — ABNORMAL HIGH (ref <130)
Triglycerides: 146 mg/dL (ref <150)
VLDL: 29 mg/dL (ref <30)

## 2015-04-08 LAB — COMPREHENSIVE METABOLIC PANEL
ALK PHOS: 85 U/L (ref 33–130)
ALT: 13 U/L (ref 6–29)
AST: 21 U/L (ref 10–35)
Albumin: 3.9 g/dL (ref 3.6–5.1)
BUN: 13 mg/dL (ref 7–25)
CALCIUM: 8.9 mg/dL (ref 8.6–10.4)
CHLORIDE: 102 mmol/L (ref 98–110)
CO2: 29 mmol/L (ref 20–31)
Creat: 0.56 mg/dL (ref 0.50–1.05)
GLUCOSE: 91 mg/dL (ref 65–99)
Potassium: 4 mmol/L (ref 3.5–5.3)
Sodium: 142 mmol/L (ref 135–146)
Total Bilirubin: 0.6 mg/dL (ref 0.2–1.2)
Total Protein: 6.6 g/dL (ref 6.1–8.1)

## 2015-04-08 LAB — TSH: TSH: 0.525 u[IU]/mL (ref 0.350–4.500)

## 2015-04-08 LAB — PROLACTIN: Prolactin: 6.1 ng/mL

## 2015-04-09 LAB — URINE CULTURE: Colony Count: 25000

## 2015-04-22 ENCOUNTER — Encounter: Payer: Self-pay | Admitting: Gastroenterology

## 2015-05-25 ENCOUNTER — Ambulatory Visit (AMBULATORY_SURGERY_CENTER): Payer: Self-pay | Admitting: *Deleted

## 2015-05-25 VITALS — Ht 59.0 in | Wt 158.2 lb

## 2015-05-25 DIAGNOSIS — Z1211 Encounter for screening for malignant neoplasm of colon: Secondary | ICD-10-CM

## 2015-05-25 NOTE — Progress Notes (Signed)
No allergies to eggs or soy. No problems with anesthesia.  Pt given Emmi instructions for colonoscopy  No oxygen use  No diet drug use  

## 2015-06-04 ENCOUNTER — Encounter: Payer: Self-pay | Admitting: Gastroenterology

## 2015-06-08 ENCOUNTER — Encounter: Payer: Self-pay | Admitting: Gastroenterology

## 2015-06-08 ENCOUNTER — Ambulatory Visit (AMBULATORY_SURGERY_CENTER): Payer: PRIVATE HEALTH INSURANCE | Admitting: Gastroenterology

## 2015-06-08 VITALS — BP 137/78 | HR 90 | Temp 98.5°F | Resp 16 | Ht 59.0 in | Wt 158.0 lb

## 2015-06-08 DIAGNOSIS — Z1211 Encounter for screening for malignant neoplasm of colon: Secondary | ICD-10-CM | POA: Diagnosis present

## 2015-06-08 MED ORDER — SODIUM CHLORIDE 0.9 % IV SOLN: 500.0000 mL | INTRAVENOUS | Status: DC

## 2015-06-08 NOTE — Progress Notes (Signed)
A/ox3 pleased with MAC, report to Annette RN 

## 2015-06-08 NOTE — Op Note (Signed)
[place]  [address]. [place], [address]   COLONOSCOPY PROCEDURE REPORT  PATIENT: [name], [name]  MR#: [id number] BIRTHDATE: 03/20/1961 , 20  yrs. old GENDER: female ENDOSCOPIST: Yetta Flock, MD REFERRED BY: Shanon Ace MD PROCEDURE DATE:  06/08/2015 PROCEDURE:   Colonoscopy, screening First Screening Colonoscopy - Avg.  risk and is 50 yrs.  old or older Yes.  Prior Negative Screening - Now for repeat screening. N/A  History of Adenoma - Now for follow-up colonoscopy & has been > or = to 3 yrs.  N/A  Recommend repeat exam, <10 yrs? No ASA CLASS:   Class II INDICATIONS:Screening for colonic neoplasia and Colorectal Neoplasm Risk Assessment for this procedure is average risk. MEDICATIONS: Propofol 280 mg IV  DESCRIPTION OF PROCEDURE:   After the risks benefits and alternatives of the procedure were thoroughly explained, informed consent was obtained.  The digital rectal exam revealed no abnormalities of the rectum.   The LB [id number]-[id number]  endoscope was introduced through the anus and advanced to the cecum, which was identified by both the appendix and ileocecal valve. No adverse events experienced.   The quality of the prep was good.  The instrument was then slowly withdrawn as the colon was fully examined. Estimated blood loss is zero unless otherwise noted in this procedure report.    COLON FINDINGS: A normal appearing cecum, ileocecal valve, and appendiceal orifice were identified.  The ascending, transverse, descending, sigmoid colon, and rectum appeared unremarkable.  No polyps or mass lesions noted. Retroflexed views revealed internal hemorrhoids and hypertrophied anal papillae. The time to cecum = 4.6 Withdrawal time = 16.8   The scope was withdrawn and the procedure completed. COMPLICATIONS: There were no immediate complications.  ENDOSCOPIC IMPRESSION: Normal colonoscopy - no polyps  RECOMMENDATIONS: Resume diet Resume  medications Repeat colonoscopy in 10 years for screening purposes  eSigned:  Yetta Flock, MD 06/08/2015 1:58 PM   cc: Shanon Ace MD, the patient

## 2015-06-08 NOTE — Patient Instructions (Signed)
YOU HAD AN ENDOSCOPIC PROCEDURE TODAY AT [facility]:   Refer to the procedure report that was given to you for any specific questions about what was found during the examination.  If the procedure report does not answer your questions, please call your gastroenterologist to clarify.  If you requested that your care partner not be given the details of your procedure findings, then the procedure report has been included in a sealed envelope for you to review at your convenience later.  YOU SHOULD EXPECT: Some feelings of bloating in the abdomen. Passage of more gas than usual.  Walking can help get rid of the air that was put into your GI tract during the procedure and reduce the bloating. If you had a lower endoscopy (such as a colonoscopy or flexible sigmoidoscopy) you may notice spotting of blood in your stool or on the toilet paper. If you underwent a bowel prep for your procedure, you may not have a normal bowel movement for a few days.  Please Note:  You might notice some irritation and congestion in your nose or some drainage.  This is from the oxygen used during your procedure.  There is no need for concern and it should clear up in a day or so.  SYMPTOMS TO REPORT IMMEDIATELY:   Following lower endoscopy (colonoscopy or flexible sigmoidoscopy):  Excessive amounts of blood in the stool  Significant tenderness or worsening of abdominal pains  Swelling of the abdomen that is new, acute  Fever of 100F or higher   For urgent or emergent issues, a gastroenterologist can be reached at any hour by calling [phone].   DIET: Your first meal following the procedure should be a small meal and then it is ok to progress to your normal diet. Heavy or fried foods are harder to digest and may make you feel nauseous or bloated.  Likewise, meals heavy in dairy and vegetables can increase bloating.  Drink plenty of fluids but you should avoid alcoholic beverages for 24  hours.  ACTIVITY:  You should plan to take it easy for the rest of today and you should NOT DRIVE or use heavy machinery until tomorrow (because of the sedation medicines used during the test).    FOLLOW UP: Our staff will call the number listed on your records the next business day following your procedure to check on you and address any questions or concerns that you may have regarding the information given to you following your procedure. If we do not reach you, we will leave a message.  However, if you are feeling well and you are not experiencing any problems, there is no need to return our call.  We will assume that you have returned to your regular daily activities without incident.  If any biopsies were taken you will be contacted by phone or by letter within the next 1-3 weeks.  Please call us at [phone] if you have not heard about the biopsies in 3 weeks.    SIGNATURES/CONFIDENTIALITY: You and/or your care partner have signed paperwork which will be entered into your electronic medical record.  These signatures attest to the fact that that the information above on your After Visit Summary has been reviewed and is understood.  Full responsibility of the confidentiality of this discharge information lies with you and/or your care-partner.    Handouts were given to your care partner on hemorrhoids and a high fiber diet with liberal fluid intake You may resume your  current medications today. Please call if any questions or concerns.

## 2015-06-08 NOTE — Progress Notes (Signed)
No problems noted in the recovery room. maw 

## 2015-06-09 ENCOUNTER — Telehealth: Payer: Self-pay

## 2015-06-09 NOTE — Telephone Encounter (Signed)
  Follow up Call-  Call back number 06/08/2015  Post procedure Call Back phone  # [phone]  Permission to leave phone message Yes     Patient questions:  Do you have a fever, pain , or abdominal swelling? No. Pain Score  0 *  Have you tolerated food without any problems? Yes.    Have you been able to return to your normal activities? Yes.    Do you have any questions about your discharge instructions: Diet   No. Medications  No. Follow up visit  No.  Do you have questions or concerns about your Care? No.  Actions: * If pain score is 4 or above: No action needed, pain <4. Patient states right hand is slightly swollen from IV site. Instructed to apply warm compresses as needed. Also to call back if there are any further concerns.

## 2015-11-17 ENCOUNTER — Telehealth: Payer: Self-pay | Admitting: Internal Medicine

## 2015-11-17 MED ORDER — ATOVAQUONE-PROGUANIL HCL 250-100 MG PO TABS: ORAL_TABLET | ORAL | Status: DC

## 2015-11-17 NOTE — Telephone Encounter (Signed)
Husband notified to pick up at the pharmacy.  Pt was questioning about different brands of anti malaria medication.  Advised that he could contact the pharmacy for that information.

## 2015-11-17 NOTE — Telephone Encounter (Signed)
Ok as before

## 2015-11-17 NOTE — Telephone Encounter (Signed)
Pt is traveling to [place] and need anti Malaria pills travel 7/12 thru 02/29/16  Will like a hand written rx

## 2016-03-03 ENCOUNTER — Ambulatory Visit
Admission: RE | Admit: 2016-03-03 | Discharge: 2016-03-03 | Disposition: A | Discharge status: Home / Self Care | Payer: PRIVATE HEALTH INSURANCE | Source: Ambulatory Visit | Attending: Gynecology | Admitting: Gynecology

## 2016-03-03 ENCOUNTER — Other Ambulatory Visit: Payer: Self-pay | Admitting: Gynecology

## 2016-03-03 DIAGNOSIS — Z1231 Encounter for screening mammogram for malignant neoplasm of breast: Secondary | ICD-10-CM

## 2016-03-29 ENCOUNTER — Encounter: Payer: Self-pay | Admitting: Gynecology

## 2016-03-29 ENCOUNTER — Ambulatory Visit (INDEPENDENT_AMBULATORY_CARE_PROVIDER_SITE_OTHER): Payer: PRIVATE HEALTH INSURANCE | Admitting: Gynecology

## 2016-03-29 VITALS — BP 128/76 | Ht 58.25 in | Wt 160.0 lb

## 2016-03-29 DIAGNOSIS — Z01419 Encounter for gynecological examination (general) (routine) without abnormal findings: Secondary | ICD-10-CM | POA: Diagnosis not present

## 2016-03-29 DIAGNOSIS — M8580 Other specified disorders of bone density and structure, unspecified site: Secondary | ICD-10-CM | POA: Diagnosis not present

## 2016-03-29 DIAGNOSIS — Z8639 Personal history of other endocrine, nutritional and metabolic disease: Secondary | ICD-10-CM

## 2016-03-29 DIAGNOSIS — Z780 Asymptomatic menopausal state: Secondary | ICD-10-CM | POA: Diagnosis not present

## 2016-03-29 NOTE — Progress Notes (Signed)
[name] September 19, 1960 [id number]   History:    55 y.o.  for annual gyn exam with the only complaint of polyuria and polydipsia. Patient denies any family history of diabetes. Patient is menopausal with no vasomotor symptoms not on any hormone replacement therapy. Review of her records indicated that several years ago she had hyperprolactinemia and we check her prolactin levels yearly. She denies any visual disturbances, nipple discharge or any unusual headaches. Patient had a colonoscopy in 2016 which was normal. Her bone density last year demonstrated that her lowest T score was -1.6 at the right femoral neck (decreased bone mineralization) osteopenia with a normal Frax analysis. Patient is on calcium and vitamin D and maintains an active lifestyle. Patient with no previous history of any abnormal Pap smears.  Past medical history,surgical history, family history and social history were all reviewed and documented in the EPIC chart.  Gynecologic History No LMP recorded. Patient is postmenopausal. Contraception: post menopausal status Last Pap: 2015. Results were: normal Last mammogram: 2017. Results were: normal  Obstetric History OB History  Gravida Para Term Preterm AB Living  2 2 2     2   SAB TAB Ectopic Multiple Live Births          2    # Outcome Date GA Lbr Len/2nd Weight Sex Delivery Anes PTL Lv  2 Term     M CS-Unspec  N LIV  1 Term     F CS-Unspec  N LIV       ROS: A ROS was performed and pertinent positives and negatives are included in the history.  GENERAL: No fevers or chills. HEENT: No change in vision, no earache, sore throat or sinus congestion. NECK: No pain or stiffness. CARDIOVASCULAR: No chest pain or pressure. No palpitations. PULMONARY: No shortness of breath, cough or wheeze. GASTROINTESTINAL: No abdominal pain, nausea, vomiting or diarrhea, melena or bright red blood per rectum. GENITOURINARY: No urinary frequency, urgency, hesitancy or dysuria.  MUSCULOSKELETAL: No joint or muscle pain, no back pain, no recent trauma. DERMATOLOGIC: No rash, no itching, no lesions. ENDOCRINE: No polyuria, polydipsia, no heat or cold intolerance. No recent change in weight. HEMATOLOGICAL: No anemia or easy bruising or bleeding. NEUROLOGIC: No headache, seizures, numbness, tingling or weakness. PSYCHIATRIC: No depression, no loss of interest in normal activity or change in sleep pattern.     Exam: chaperone present  BP 128/76   Ht 4' 10.25" (1.48 m)   Wt 160 lb (72.6 kg)   BMI 33.15 kg/m   Body mass index is 33.15 kg/m.  General appearance : Well developed well nourished female. No acute distress HEENT: Eyes: no retinal hemorrhage or exudates,  Neck supple, trachea midline, no carotid bruits, no thyroidmegaly Lungs: Clear to auscultation, no rhonchi or wheezes, or rib retractions  Heart: Regular rate and rhythm, no murmurs or gallops Breast:Examined in sitting and supine position were symmetrical in appearance, no palpable masses or tenderness,  no skin retraction, no nipple inversion, no nipple discharge, no skin discoloration, no axillary or supraclavicular lymphadenopathy Abdomen: no palpable masses or tenderness, no rebound or guarding Extremities: no edema or skin discoloration or tenderness  Pelvic:  Bartholin, Urethra, Skene Glands: Within normal limits             Vagina: No gross lesions or discharge  Cervix: No gross lesions or discharge  Uterus  anteverted, normal size, shape and consistency, non-tender and mobile  Adnexa  Without masses or tenderness  Anus and perineum  normal   Rectovaginal  normal sphincter tone without palpated masses or tenderness             Hemoccult colonoscopy less than 12 months O normal     Assessment/Plan:  55 y.o. female for annual exam menopausal with history of osteopenia due for her next bone density study in 2018. Patient continues to take her calcium vitamin D and maintain an active lifestyle. She  will return back to the office sometime next week for the following fasting blood work: Comprehensive metabolic panel, fasting lipid profile, TSH, CBC, and urinalysis. Because of her past history of hyperprolactinemia we'll check a prolactin level and because of history of osteopenia we'll check a vitamin D level. Pap smear not indicated this year. We discussed importance of monthly breast exams.   Terrance Mass MD, 3:50 PM 03/29/2016

## 2016-03-29 NOTE — Patient Instructions (Signed)
Patient information: High cholesterol (The Basics)  What is cholesterol? - Cholesterol is a substance that is found in the blood. Everyone has some. It is needed for good health. The problem is, people sometimes have too much cholesterol. Compared with people with normal cholesterol, people with high cholesterol have a higher risk of heart attacks, strokes, and other health problems. The higher your cholesterol, the higher your risk of these problems. Cholesterol levels in your body are determined significantly by your diet. Cholesterol levels may also be related to heart disease. The following material helps to explain this relationship and discusses what you can do to help keep your heart healthy. Not all cholesterol is bad. Low-density lipoprotein (LDL) cholesterol is the "bad" cholesterol. It may cause fatty deposits to build up inside your arteries. High-density lipoprotein (HDL) cholesterol is "good." It helps to remove the "bad" LDL cholesterol from your blood. Cholesterol is a very important risk factor for heart disease. Other risk factors are high blood pressure, smoking, stress, heredity, and weight.  The heart muscle gets its supply of blood through the coronary arteries. If your LDL cholesterol is high and your HDL cholesterol is low, you are at risk for having fatty deposits build up in your coronary arteries. This leaves less room through which blood can flow. Without sufficient blood and oxygen, the heart muscle cannot function properly and you may feel chest pains (angina pectoris). When a coronary artery closes up entirely, a part of the heart muscle may die, causing a heart attack (myocardial infarction).  CHECKING CHOLESTEROL When your caregiver sends your blood to a lab to be analyzed for cholesterol, a complete lipid (fat) profile may be done. With this test, the total amount of cholesterol and levels of LDL and HDL are determined. Triglycerides  are a type of fat that circulates in the blood and can also be used to determine heart disease risk. Are there different types of cholesterol? - Yes, there are a few different types. If you get a cholesterol test, you may hear your doctor or nurse talk about: Total cholesterol  LDL cholesterol - Some people call this the "bad" cholesterol. That's because having high LDL levels raises your risk of heart attacks, strokes, and other health problems.  HDL cholesterol - Some people call this the "good" cholesterol. That's because having high HDL levels lowers your risk of heart attacks, strokes, and other health problems.  Non-HDL cholesterol - Non-HDL cholesterol is your total cholesterol minus your HDL cholesterol.  Triglycerides - Triglycerides are not cholesterol. They are a type of fat. But they often get measured when cholesterol is measured. (Having high triglycerides also seems to increase the risk of heart attacks and strokes.)   Keep in mind, though, that many people who cannot meet these goals still have a low risk of heart attacks and strokes. What should I do if my doctor tells me I have high cholesterol? - Ask your doctor what your overall risk of heart attacks and strokes is. High cholesterol, by itself, is not always a reason to worry. Having high cholesterol is just one of many things that can increase your risk of heart attacks and strokes. Other factors that increase your risk include:  Cigarette smoking  High blood pressure  Having a parent, sister, or brother who got heart disease at a young age (Young, in this case, means younger than 55 for men and younger than 65 for women.)  Being a man (Women are at risk, too, but men   have a higher risk.)  Older age  If you are at high risk of heart attacks and strokes, having high cholesterol is a problem. On the other hand, if you have are at low risk, having high cholesterol may not mean much. Should I take medicine to lower cholesterol? - Not  everyone who has high cholesterol needs medicines. Your doctor or nurse will decide if you need them based on your age, family history, and other health concerns.  You should probably take a cholesterol-lowering medicine called a statin if you: Already had a heart attack or stroke  Have known heart disease  Have diabetes  Have a condition called peripheral artery disease, which makes it painful to walk, and happens when the arteries in your legs get clogged with fatty deposits  Have an abdominal aortic aneurysm, which is a widening of the main artery in the belly  Most people with any of the conditions listed above should take a statin no matter what their cholesterol level is. If your doctor or nurse puts you on a statin, stay on it. The medicine may not make you feel any different. But it can help prevent heart attacks, strokes, and death.  Can I lower my cholesterol without medicines? - Yes, you can lower your cholesterol some by:  Avoiding red meat, butter, fried foods, cheese, and other foods that have a lot of saturated fat  Losing weight (if you are overweight)  Being more active Even if these steps do little to change your cholesterol, they can improve your health in many ways.                                                   Cholesterol Control Diet  CONTROLLING CHOLESTEROL WITH DIET Although exercise and lifestyle factors are important, your diet is key. That is because certain foods are known to raise cholesterol and others to lower it. The goal is to balance foods for their effect on cholesterol and more importantly, to replace saturated and trans fat with other types of fat, such as monounsaturated fat, polyunsaturated fat, and omega-3 fatty acids. On average, a person should consume no more than 15 to 17 g of saturated fat daily. Saturated and trans fats are considered "bad" fats, and they will raise LDL cholesterol. Saturated fats are primarily found in animal products such as  meats, butter, and cream. However, that does not mean you need to sacrifice all your favorite foods. Today, there are good tasting, low-fat, low-cholesterol substitutes for most of the things you like to eat. Choose low-fat or nonfat alternatives. Choose round or loin cuts of red meat, since these types of cuts are lowest in fat and cholesterol. Chicken (without the skin), fish, veal, and ground turkey breast are excellent choices. Eliminate fatty meats, such as hot dogs and salami. Even shellfish have little or no saturated fat. Have a 3 oz (85 g) portion when you eat lean meat, poultry, or fish. Trans fats are also called "partially hydrogenated oils." They are oils that have been scientifically manipulated so that they are solid at room temperature resulting in a longer shelf life and improved taste and texture of foods in which they are added. Trans fats are found in stick margarine, some tub margarines, cookies, crackers, and baked goods.  When baking and cooking, oils are an excellent substitute   for butter. The monounsaturated oils are especially beneficial since it is believed they lower LDL and raise HDL. The oils you should avoid entirely are saturated tropical oils, such as coconut and palm.  Remember to eat liberally from food groups that are naturally free of saturated and trans fat, including fish, fruit, vegetables, beans, grains (barley, rice, couscous, bulgur wheat), and pasta (without cream sauces).  IDENTIFYING FOODS THAT LOWER CHOLESTEROL  Soluble fiber may lower your cholesterol. This type of fiber is found in fruits such as apples, vegetables such as broccoli, potatoes, and carrots, legumes such as beans, peas, and lentils, and grains such as barley. Foods fortified with plant sterols (phytosterol) may also lower cholesterol. You should eat at least 2 g per day of these foods for a cholesterol lowering effect.  Read package labels to identify low-saturated fats, trans fats free, and  low-fat foods at the supermarket. Select cheeses that have only 2 to 3 g saturated fat per ounce. Use a heart-healthy tub margarine that is free of trans fats or partially hydrogenated oil. When buying baked goods (cookies, crackers), avoid partially hydrogenated oils. Breads and muffins should be made from whole grains (whole-wheat or whole oat flour, instead of "flour" or "enriched flour"). Buy non-creamy canned soups with reduced salt and no added fats.  FOOD PREPARATION TECHNIQUES  Never deep-fry. If you must fry, either stir-fry, which uses very little fat, or use non-stick cooking sprays. When possible, broil, bake, or roast meats, and steam vegetables. Instead of dressing vegetables with butter or margarine, use lemon and herbs, applesauce and cinnamon (for squash and sweet potatoes), nonfat yogurt, salsa, and low-fat dressings for salads.  LOW-SATURATED FAT / LOW-FAT FOOD SUBSTITUTES Meats / Saturated Fat (g)  Avoid: Steak, marbled (3 oz/85 g) / 11 g   Choose: Steak, lean (3 oz/85 g) / 4 g   Avoid: Hamburger (3 oz/85 g) / 7 g   Choose: Hamburger, lean (3 oz/85 g) / 5 g   Avoid: Ham (3 oz/85 g) / 6 g   Choose: Ham, lean cut (3 oz/85 g) / 2.4 g   Avoid: Chicken, with skin, dark meat (3 oz/85 g) / 4 g   Choose: Chicken, skin removed, dark meat (3 oz/85 g) / 2 g   Avoid: Chicken, with skin, light meat (3 oz/85 g) / 2.5 g   Choose: Chicken, skin removed, light meat (3 oz/85 g) / 1 g  Dairy / Saturated Fat (g)  Avoid: Whole milk (1 cup) / 5 g   Choose: Low-fat milk, 2% (1 cup) / 3 g   Choose: Low-fat milk, 1% (1 cup) / 1.5 g   Choose: Skim milk (1 cup) / 0.3 g   Avoid: Hard cheese (1 oz/28 g) / 6 g   Choose: Skim milk cheese (1 oz/28 g) / 2 to 3 g   Avoid: Cottage cheese, 4% fat (1 cup) / 6.5 g   Choose: Low-fat cottage cheese, 1% fat (1 cup) / 1.5 g   Avoid: Ice cream (1 cup) / 9 g   Choose: Sherbet (1 cup) / 2.5 g   Choose: Nonfat frozen yogurt (1 cup) / 0.3 g    Choose: Frozen fruit bar / trace   Avoid: Whipped cream (1 tbs) / 3.5 g   Choose: Nondairy whipped topping (1 tbs) / 1 g  Condiments / Saturated Fat (g)  Avoid: Mayonnaise (1 tbs) / 2 g   Choose: Low-fat mayonnaise (1 tbs) / 1 g   Avoid:   Butter (1 tbs) / 7 g   Choose: Extra light margarine (1 tbs) / 1 g   Avoid: Coconut oil (1 tbs) / 11.8 g   Choose: Olive oil (1 tbs) / 1.8 g   Choose: Corn oil (1 tbs) / 1.7 g   Choose: Safflower oil (1 tbs) / 1.2 g   Choose: Sunflower oil (1 tbs) / 1.4 g   Choose: Soybean oil (1 tbs) / 2.4 g   Choose: Canola oil (1 tbs) / 1 g  Exercise to Lose Weight Exercise and a healthy diet may help you lose weight. Your doctor may suggest specific exercises. EXERCISE IDEAS AND TIPS  Choose low-cost things you enjoy doing, such as walking, bicycling, or exercising to workout videos.   Take stairs instead of the elevator.   Walk during your lunch break.   Park your car further away from work or school.   Go to a gym or an exercise class.   Start with 5 to 10 minutes of exercise each day. Build up to 30 minutes of exercise 4 to 6 days a week.   Wear shoes with good support and comfortable clothes.   Stretch before and after working out.   Work out until you breathe harder and your heart beats faster.   Drink extra water when you exercise.   Do not do so much that you hurt yourself, feel dizzy, or get very short of breath.  Exercises that burn about 150 calories:  Running 1  miles in 15 minutes.   Playing volleyball for 45 to 60 minutes.   Washing and waxing a car for 45 to 60 minutes.   Playing touch football for 45 minutes.   Walking 1  miles in 35 minutes.   Pushing a stroller 1  miles in 30 minutes.   Playing basketball for 30 minutes.   Raking leaves for 30 minutes.   Bicycling 5 miles in 30 minutes.   Walking 2 miles in 30 minutes.   Dancing for 30 minutes.   Shoveling snow for 15 minutes.   Swimming laps  for 20 minutes.   Walking up stairs for 15 minutes.   Bicycling 4 miles in 15 minutes.   Gardening for 30 to 45 minutes.   Jumping rope for 15 minutes.   Washing windows or floors for 45 to 60 minutes.  Document Released: 08/20/2010 Document Revised: 03/30/2011 Document Reviewed: 08/20/2010 [employer] Patient Information 2012 [employer].                                           

## 2016-03-30 LAB — URINALYSIS W MICROSCOPIC + REFLEX CULTURE
BACTERIA UA: NONE SEEN [HPF]
Bilirubin Urine: NEGATIVE
CASTS: NONE SEEN [LPF]
Crystals: NONE SEEN [HPF]
Glucose, UA: NEGATIVE
HGB URINE DIPSTICK: NEGATIVE
Ketones, ur: NEGATIVE
NITRITE: NEGATIVE
PROTEIN: NEGATIVE
RBC / HPF: NONE SEEN RBC/HPF (ref <=2)
SQUAMOUS EPITHELIAL / LPF: NONE SEEN [HPF] (ref <=5)
Specific Gravity, Urine: 1.004 (ref 1.001–1.035)
WBC, UA: NONE SEEN WBC/HPF (ref <=5)
YEAST: NONE SEEN [HPF]
pH: 7 (ref 5.0–8.0)

## 2016-03-31 LAB — URINE CULTURE: Organism ID, Bacteria: 10000

## 2016-04-01 ENCOUNTER — Other Ambulatory Visit: Payer: PRIVATE HEALTH INSURANCE

## 2016-04-01 LAB — CBC WITH DIFFERENTIAL/PLATELET
BASOS PCT: 1 %
Basophils Absolute: 87 cells/uL (ref 0–200)
Eosinophils Absolute: 174 cells/uL (ref 15–500)
Eosinophils Relative: 2 %
HEMATOCRIT: 43.6 % (ref 35.0–45.0)
Hemoglobin: 14.3 g/dL (ref 11.7–15.5)
LYMPHS PCT: 31 %
Lymphs Abs: 2697 cells/uL (ref 850–3900)
MCH: 28.4 pg (ref 27.0–33.0)
MCHC: 32.8 g/dL (ref 32.0–36.0)
MCV: 86.5 fL (ref 80.0–100.0)
MPV: 9.6 fL (ref 7.5–12.5)
Monocytes Absolute: 609 cells/uL (ref 200–950)
Monocytes Relative: 7 %
NEUTROS ABS: 5133 {cells}/uL (ref 1500–7800)
Neutrophils Relative %: 59 %
Platelets: 307 10*3/uL (ref 140–400)
RBC: 5.04 MIL/uL (ref 3.80–5.10)
RDW: 13.6 % (ref 11.0–15.0)
WBC: 8.7 10*3/uL (ref 3.8–10.8)

## 2016-04-01 LAB — COMPREHENSIVE METABOLIC PANEL
ALK PHOS: 71 U/L (ref 33–130)
ALT: 16 U/L (ref 6–29)
AST: 17 U/L (ref 10–35)
Albumin: 3.9 g/dL (ref 3.6–5.1)
BUN: 10 mg/dL (ref 7–25)
CALCIUM: 8.8 mg/dL (ref 8.6–10.4)
CO2: 24 mmol/L (ref 20–31)
Chloride: 105 mmol/L (ref 98–110)
Creat: 0.68 mg/dL (ref 0.50–1.05)
GLUCOSE: 97 mg/dL (ref 65–99)
Potassium: 4.5 mmol/L (ref 3.5–5.3)
Sodium: 142 mmol/L (ref 135–146)
Total Bilirubin: 0.4 mg/dL (ref 0.2–1.2)
Total Protein: 6.4 g/dL (ref 6.1–8.1)

## 2016-04-01 LAB — LIPID PANEL
Cholesterol: 203 mg/dL — ABNORMAL HIGH (ref 125–200)
HDL: 69 mg/dL (ref >=46)
LDL Cholesterol: 112 mg/dL (ref <130)
Total CHOL/HDL Ratio: 2.9 Ratio (ref <=5.0)
Triglycerides: 110 mg/dL (ref <150)
VLDL: 22 mg/dL (ref <30)

## 2016-04-01 LAB — PROLACTIN: Prolactin: 6.4 ng/mL

## 2016-04-01 LAB — TSH: TSH: 0.62 m[IU]/L

## 2016-04-02 LAB — VITAMIN D 25 HYDROXY (VIT D DEFICIENCY, FRACTURES): VIT D 25 HYDROXY: 20 ng/mL — AB (ref 30–100)

## 2016-04-04 ENCOUNTER — Encounter: Payer: Self-pay | Admitting: Gynecology

## 2016-04-05 ENCOUNTER — Other Ambulatory Visit: Payer: Self-pay | Admitting: Gynecology

## 2016-04-05 DIAGNOSIS — E559 Vitamin D deficiency, unspecified: Secondary | ICD-10-CM

## 2016-04-05 MED ORDER — VITAMIN D (ERGOCALCIFEROL) 1.25 MG (50000 UNIT) PO CAPS: 50000.0000 [IU] | ORAL_CAPSULE | ORAL | 0 refills | Status: DC
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
## 2016-04-26 ENCOUNTER — Encounter: Payer: Self-pay | Admitting: Internal Medicine

## 2016-04-26 ENCOUNTER — Ambulatory Visit (INDEPENDENT_AMBULATORY_CARE_PROVIDER_SITE_OTHER): Payer: PRIVATE HEALTH INSURANCE | Admitting: Internal Medicine

## 2016-04-26 VITALS — BP 134/86 | Temp 98.0°F | Wt 158.6 lb

## 2016-04-26 DIAGNOSIS — Z23 Encounter for immunization: Secondary | ICD-10-CM

## 2016-04-26 DIAGNOSIS — M722 Plantar fascial fibromatosis: Secondary | ICD-10-CM | POA: Diagnosis not present

## 2016-04-26 DIAGNOSIS — L989 Disorder of the skin and subcutaneous tissue, unspecified: Secondary | ICD-10-CM | POA: Diagnosis not present

## 2016-04-26 MED ORDER — FLUOCINONIDE-E 0.05 % EX CREA: 1.0000 "application " | TOPICAL_CREAM | Freq: Two times a day (BID) | CUTANEOUS | 0 refills | Status: DC

## 2016-04-26 NOTE — Patient Instructions (Addendum)
I agree this heel pain is plantar fasciitis. Cross training exercise stretch in the evening and first thing in the morning. Use ice or cold you're the end of the day or after activity. I agree shoes with good heel support and inserts are helpful. We usually use anti-inflammatories but is since you can't take them would not advise. This is an overuse injury if it is not improved in 4-6 weeks consider seeing sports medicine. Add a stronger topical cortisone to the area on your left leg twice a day. Try to avoid scratching. Usually they improve over 2-3 weeks. If not let us know for advice check.   Plantar Fasciitis Plantar fasciitis is a painful foot condition that affects the heel. It occurs when the band of tissue that connects the toes to the heel bone (plantar fascia) becomes irritated. This can happen after exercising too much or doing other repetitive activities (overuse injury). The pain from plantar fasciitis can range from mild irritation to severe pain that makes it difficult for you to walk or move. The pain is usually worse in the morning or after you have been sitting or lying down for a while. CAUSES This condition may be caused by:  Standing for long periods of time.  Wearing shoes that do not fit.  Doing high-impact activities, including running, aerobics, and ballet.  Being overweight.  Having an abnormal way of walking (gait).  Having tight calf muscles.  Having high arches in your feet.  Starting a new athletic activity. SYMPTOMS The main symptom of this condition is heel pain. Other symptoms include:  Pain that gets worse after activity or exercise.  Pain that is worse in the morning or after resting.  Pain that goes away after you walk for a few minutes. DIAGNOSIS This condition may be diagnosed based on your signs and symptoms. Your health care provider will also do a physical exam to check for:  A tender area on the bottom of your foot.  A high arch in  your foot.  Pain when you move your foot.  Difficulty moving your foot. You may also need to have imaging studies to confirm the diagnosis. These can include:  X-rays.  Ultrasound.  MRI. TREATMENT  Treatment for plantar fasciitis depends on the severity of the condition. Your treatment may include:  Rest, ice, and over-the-counter pain medicines to manage your pain.  Exercises to stretch your calves and your plantar fascia.  A splint that holds your foot in a stretched, upward position while you sleep (night splint).  Physical therapy to relieve symptoms and prevent problems in the future.  Cortisone injections to relieve severe pain.  Extracorporeal shock wave therapy (ESWT) to stimulate damaged plantar fascia with electrical impulses. It is often used as a last resort before surgery.  Surgery, if other treatments have not worked after 12 months. HOME CARE INSTRUCTIONS  Take medicines only as directed by your health care provider.  Avoid activities that cause pain.  Roll the bottom of your foot over a bag of ice or a bottle of cold water. Do this for 20 minutes, 3-4 times a day.  Perform simple stretches as directed by your health care provider.  Try wearing athletic shoes with air-sole or gel-sole cushions or soft shoe inserts.  Wear a night splint while sleeping, if directed by your health care provider.  Keep all follow-up appointments with your health care provider. PREVENTION   Do not perform exercises or activities that cause heel pain.  Consider  finding low-impact activities if you continue to have problems.  Lose weight if you need to. The best way to prevent plantar fasciitis is to avoid the activities that aggravate your plantar fascia. SEEK MEDICAL CARE IF:  Your symptoms do not go away after treatment with home care measures.  Your pain gets worse.  Your pain affects your ability to move or do your daily activities.   This information is not  intended to replace advice given to you by your health care provider. Make sure you discuss any questions you have with your health care provider.   Document Released: 04/12/2001 Document Revised: 04/08/2015 Document Reviewed: 05/28/2014 Elsevier Interactive Patient Education [employer].

## 2016-07-29 ENCOUNTER — Other Ambulatory Visit: Payer: PRIVATE HEALTH INSURANCE

## 2016-07-29 DIAGNOSIS — E559 Vitamin D deficiency, unspecified: Secondary | ICD-10-CM

## 2016-07-30 LAB — VITAMIN D 25 HYDROXY (VIT D DEFICIENCY, FRACTURES): Vit D, 25-Hydroxy: 39 ng/mL (ref 30–100)

## 2016-12-14 ENCOUNTER — Encounter: Payer: Self-pay | Admitting: Gynecology

## 2017-01-05 ENCOUNTER — Telehealth: Payer: Self-pay | Admitting: Internal Medicine

## 2017-01-05 ENCOUNTER — Encounter: Payer: Self-pay | Admitting: Internal Medicine

## 2017-01-05 ENCOUNTER — Ambulatory Visit (INDEPENDENT_AMBULATORY_CARE_PROVIDER_SITE_OTHER): Payer: PRIVATE HEALTH INSURANCE | Admitting: Internal Medicine

## 2017-01-05 VITALS — BP 130/72 | HR 99 | Temp 98.0°F | Ht 58.25 in | Wt 155.0 lb

## 2017-01-05 DIAGNOSIS — J988 Other specified respiratory disorders: Secondary | ICD-10-CM

## 2017-01-05 DIAGNOSIS — J069 Acute upper respiratory infection, unspecified: Secondary | ICD-10-CM | POA: Diagnosis not present

## 2017-01-05 DIAGNOSIS — R062 Wheezing: Secondary | ICD-10-CM

## 2017-01-05 MED ORDER — IPRATROPIUM-ALBUTEROL 0.5-2.5 (3) MG/3ML IN SOLN
3.0000 mL | Freq: Once | RESPIRATORY_TRACT | Status: AC
  Administered 2017-01-05: 3 mL via RESPIRATORY_TRACT

## 2017-01-05 MED ORDER — PROMETHAZINE-DM 6.25-15 MG/5ML PO SYRP: ORAL_SOLUTION | ORAL | 0 refills | Status: DC

## 2017-01-05 MED ORDER — IPRATROPIUM-ALBUTEROL 0.5-2.5 (3) MG/3ML IN SOLN: 3.0000 mL | Freq: Four times a day (QID) | RESPIRATORY_TRACT | Status: DC

## 2017-01-05 MED ORDER — PREDNISONE 20 MG PO TABS: 20.0000 mg | ORAL_TABLET | Freq: Two times a day (BID) | ORAL | 0 refills | Status: DC

## 2017-01-05 NOTE — Telephone Encounter (Signed)
[facility] - Client [facility] Patient Name: [name] DOB: [birth year] Initial Comment Caller states she has a cough, congestion and wheezing Nurse Assessment Nurse: Markus Daft, RN, [place] Date/Time (Eastern Time): 01/05/2017 9:05:00 AM Confirm and document reason for call. If symptomatic, describe symptoms. ---Caller states she has a cough, nasal congestion, and wheezing. No fever since yesterday. S/S started Friday, June 1. Husband and her both had the same s/s. Went to UC over the weekend and prescribed Azithromycin and took last one today. Proair inhaler (last used this AM around 7 am), and cough medicine given also. Denies CP or SOB. However, c/o severe upper abdominal pain with coughing. Does the patient have any new or worsening symptoms? ---Yes Will a triage be completed? ---Yes Related visit to physician within the last 2 weeks? ---Yes Does the PT have any chronic conditions? (i.e. diabetes, asthma, etc.) ---No Is this a behavioral health or substance abuse call? ---No Guidelines Guideline Title Affirmed Question Affirmed Notes Asthma Attack [1] MILD asthma attack (e.g., no SOB at rest, mild SOB with walking, speaks normally in sentences, mild wheezing) AND [2] persists > 24 hours on appropriate treatment Final Disposition User See Physician within [place], [place], [place] Comments Appt made with Dr. Regis Bill for 9:45 am today. Referrals REFERRED TO PCP OFFICE Disagree/Comply: Comply

## 2017-01-05 NOTE — Telephone Encounter (Signed)
Pt seen in [place] with Dr. Regis Bill
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
## 2017-01-05 NOTE — Patient Instructions (Signed)
This is most likely infection-induced wheezing and you  may have had before. No need for further antibiotic as it is continuing to work and benefits viral and would not make a difference. Continue your albuterol pro-air every 6 hours as needed add prednisone 5 days to decrease inflammation in the bronchial tubes. If not improved after the weekend contact us for advice. It is possible that you may benefit from your inhaler use when you get a coughing illness or other options if  You continue to get wheezing with  Chest colds etc,.     Bronchospasm, Adult Bronchospasm is a tightening of the airways going into the lungs. During an episode, it may be harder to breathe. You may cough, and you may make a whistling sound when you breathe (wheeze). This condition often affects people with asthma. What are the causes? This condition is caused by swelling and irritation in the airways. It can be triggered by:  An infection (common).  Seasonal allergies.  An allergic reaction.  Exercise.  Irritants. These include pollution, cigarette smoke, strong odors, aerosol sprays, and paint fumes.  Weather changes. Winds increase molds and pollens in the air. Cold air may cause swelling.  Stress and emotional upset.  What are the signs or symptoms? Symptoms of this condition include:  Wheezing. If the episode was triggered by an allergy, wheezing may start right away or hours later.  Nighttime coughing.  Frequent or severe coughing with a simple cold.  Chest tightness.  Shortness of breath.  Decreased ability to exercise.  How is this diagnosed? This condition is usually diagnosed with a review of your medical history and a physical exam. Tests, such as lung function tests, are sometimes done to look for other conditions. The need for a chest X-ray depends on where the wheezing occurs and whether it is the first time you have wheezed. How is this treated? This condition may be treated  with:  Inhaled medicines. These open up the airways and help you breathe. They can be taken with an inhaler or a nebulizer device.  Corticosteroid medicines. These may be given for severe bronchospasm, usually when it is associated with asthma.  Avoiding triggers, such as irritants, infection, or allergies.  Follow these instructions at home: Medicines  Take over-the-counter and prescription medicines only as told by your health care provider.  If you need to use an inhaler or nebulizer to take your medicine, ask your health care provider to explain how to use it correctly. If you were given a spacer, always use it with your inhaler. Lifestyle  Reduce the number of triggers in your home. To do this: ? Change your heating and air conditioning filter at least once a month. ? Limit your use of fireplaces and wood stoves. ? Do not smoke. Do not allow smoking in your home. ? Avoid using perfumes and fragrances. ? Get rid of pests, such as roaches and mice, and their droppings. ? Remove any mold from your home. ? Keep your house clean and dust free. Use unscented cleaning products. ? Replace carpet with wood, tile, or vinyl flooring. Carpet can trap dander and dust. ? Use allergy-proof pillows, mattress covers, and box spring covers. ? Wash bed sheets and blankets every week in hot water. Dry them in a dryer. ? Use blankets that are made of polyester or cotton. ? Wash your hands often. ? Do not allow pets in your bedroom.  Avoid breathing in cold air when you exercise. General instructions  Have a plan for seeking medical care. Know when to call your health care provider and local emergency services, and where to get emergency care.  Stay up to date on your immunizations.  When you have an episode of bronchospasm, stay calm. Try to relax and breathe more slowly.  If you have asthma, make sure you have an asthma action plan.  Keep all follow-up visits as told by your health care  provider. This is important. Contact a health care provider if:  You have muscle aches.  You have chest pain.  The mucus that you cough up (sputum) changes from clear or white to yellow, green, gray, or bloody.  You have a fever.  Your sputum gets thicker. Get help right away if:  Your wheezing and coughing get worse, even after you take your prescribed medicines.  It gets even harder to breathe.  You develop severe chest pain. Summary  Bronchospasm is a tightening of the airways going into the lungs.  During an episode of bronchospasm, you may have a harder time breathing. You may cough and make a whistling sound when you breathe (wheeze).  Avoid exposure to triggers such as smoke, dust, mold, animal dander, and fragrances.  When you have an episode of bronchospasm, stay calm. Try to relax and breathe more slowly. This information is not intended to replace advice given to you by your health care provider. Make sure you discuss any questions you have with your health care provider. Document Released: 07/21/2003 Document Revised: 07/14/2016 Document Reviewed: 07/14/2016 Elsevier Interactive Patient Education  2017 [employer].

## 2017-01-16 ENCOUNTER — Ambulatory Visit (INDEPENDENT_AMBULATORY_CARE_PROVIDER_SITE_OTHER): Payer: PRIVATE HEALTH INSURANCE | Admitting: Internal Medicine

## 2017-01-16 ENCOUNTER — Encounter: Payer: Self-pay | Admitting: Internal Medicine

## 2017-01-16 VITALS — BP 160/86 | HR 108 | Temp 98.4°F | Wt 153.4 lb

## 2017-01-16 DIAGNOSIS — J988 Other specified respiratory disorders: Secondary | ICD-10-CM | POA: Diagnosis not present

## 2017-01-16 DIAGNOSIS — R000 Tachycardia, unspecified: Secondary | ICD-10-CM

## 2017-01-16 DIAGNOSIS — Z23 Encounter for immunization: Secondary | ICD-10-CM | POA: Diagnosis not present

## 2017-01-16 DIAGNOSIS — R0683 Snoring: Secondary | ICD-10-CM | POA: Diagnosis not present

## 2017-01-16 DIAGNOSIS — R030 Elevated blood-pressure reading, without diagnosis of hypertension: Secondary | ICD-10-CM

## 2017-01-16 MED ORDER — LOSARTAN POTASSIUM 25 MG PO TABS: 25.0000 mg | ORAL_TABLET | Freq: Every day | ORAL | 3 refills | Status: DC

## 2017-01-16 MED ORDER — FLUTICASONE FUROATE 100 MCG/ACT IN AEPB: 1.0000 | INHALATION_SPRAY | Freq: Every day | RESPIRATORY_TRACT | 3 refills | Status: DC
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
## 2017-01-16 NOTE — Patient Instructions (Addendum)
Exam seems better today . Heart exam seems normal except elevated pulse rate .   If bp continues  Adding medication  Is next step.   Can get tdap today.   Consider getting pulmonary to see you for  Continued wheeze and  Snoring.    And or adding other inhaler medication .

## 2017-01-27 ENCOUNTER — Encounter: Payer: Self-pay | Admitting: Internal Medicine

## 2017-01-31 MED ORDER — LOSARTAN POTASSIUM 25 MG PO TABS
25.0000 mg | ORAL_TABLET | Freq: Every day | ORAL | 3 refills | Status: DC
Start: 2017-01-31 — End: 2017-05-22

## 2017-01-31 NOTE — Telephone Encounter (Signed)
Pt is calling back concerning email to md. Pt blood pressure results was printed and given to torrie

## 2017-02-17 ENCOUNTER — Other Ambulatory Visit: Payer: Self-pay | Admitting: Internal Medicine

## 2017-02-17 DIAGNOSIS — Z1231 Encounter for screening mammogram for malignant neoplasm of breast: Secondary | ICD-10-CM

## 2017-03-08 ENCOUNTER — Ambulatory Visit
Admission: RE | Admit: 2017-03-08 | Discharge: 2017-03-08 | Disposition: A | Discharge status: Home / Self Care | Payer: PRIVATE HEALTH INSURANCE | Source: Ambulatory Visit | Attending: Internal Medicine | Admitting: Internal Medicine

## 2017-03-08 DIAGNOSIS — Z1231 Encounter for screening mammogram for malignant neoplasm of breast: Secondary | ICD-10-CM

## 2017-03-31 ENCOUNTER — Encounter: Payer: Self-pay | Admitting: Gynecology

## 2017-03-31 ENCOUNTER — Ambulatory Visit (INDEPENDENT_AMBULATORY_CARE_PROVIDER_SITE_OTHER): Payer: PRIVATE HEALTH INSURANCE | Admitting: Gynecology

## 2017-03-31 VITALS — BP 130/80 | Ht <= 58 in | Wt 156.0 lb

## 2017-03-31 DIAGNOSIS — Z01411 Encounter for gynecological examination (general) (routine) with abnormal findings: Secondary | ICD-10-CM | POA: Diagnosis not present

## 2017-03-31 DIAGNOSIS — M8580 Other specified disorders of bone density and structure, unspecified site: Secondary | ICD-10-CM | POA: Diagnosis not present

## 2017-03-31 DIAGNOSIS — N952 Postmenopausal atrophic vaginitis: Secondary | ICD-10-CM | POA: Diagnosis not present

## 2017-03-31 DIAGNOSIS — Z13220 Encounter for screening for lipoid disorders: Secondary | ICD-10-CM | POA: Diagnosis not present

## 2017-03-31 NOTE — Progress Notes (Signed)
    [name] 12/29/1960 [id number]        56 y.o.  G2P2002 for annual exam.    Past medical history,surgical history, problem list, medications, allergies, family history and social history were all reviewed and documented as reviewed in the EPIC chart.  ROS:  Performed with pertinent positives and negatives included in the history, assessment and plan.   Additional significant findings :  None   Exam: Caryn Bee assistant Vitals:   03/31/17 1455  BP: 130/80  Weight: 156 lb (70.8 kg)  Height: 4\' 10"  (1.473 m)   Body mass index is 32.6 kg/m.  General appearance:  Normal affect, orientation and appearance. Skin: Grossly normal HEENT: Without gross lesions.  No cervical or supraclavicular adenopathy. Thyroid normal.  Lungs:  Clear without wheezing, rales or rhonchi Cardiac: RR, without RMG Abdominal:  Soft, nontender, without masses, guarding, rebound, organomegaly or hernia Breasts:  Examined lying and sitting without masses, retractions, discharge or axillary adenopathy. Pelvic:  Ext, BUS, Vagina: With atrophic changes  Cervix: With atrophic changes  Uterus: Anteverted, normal size, shape and contour, midline and mobile nontender   Adnexa: Without masses or tenderness    Anus and perineum: Normal   Rectovaginal: Normal sphincter tone without palpated masses or tenderness.    Assessment/Plan:  57 y.o. G43P2002 female for annual exam.   1. Postmenopausal/atrophic genital changes. No significant hot flushes, night sweats, vaginal dryness or any vaginal bleeding. Continue to monitor report any issues or bleeding. 2. Osteopenia. DEXA 2016 T score -1.6 with normal FRAX 5.5%/0.2%. Stable from prior DEXA 2010. Options repeat now versus waiting another year to discuss the patient's comfortable waiting. Has a history of marginal vitamin D levels in the past. We'll check vitamin D level today. 3. History of hyperprolactinemia a number of years ago with negative MRI. Recent  readings have all been normal with check last year 6. We'll stop checking at this point given stability of the normal values. No symptoms to suggest dysfunction such as headaches, visual changes or galactorrhea. 4. Colonoscopy 2016. Repeat at their recommended interval. 5. Pap smear/HPV 2015. No Pap smear done today. No history of significant abnormal Pap smears. Plan repeat Pap smear at 5 year interval per current screening guidelines. 6. Health maintenance. Patient notes her resting pulse running at 100 consistently over the last 2 months. Has been started on blood pressure medication. Pulse today is in the 100 range. Recommended patient follow up with her primary physician who treats her hypertension to discuss her elevated heart rate. We'll check TSH. She requests screening lab work. She is not fasting now but will return for future labs when fasting. Orders placed for CBC, CMP, lipid profile, TSH, vitamin D, urine analysis. Follow up in one year, sooner as needed.   [name] MD, 3:30 PM 03/31/2017

## 2017-03-31 NOTE — Patient Instructions (Signed)
Return for fasting blood work at [facility].  Follow up in one year for annual exam

## 2017-04-04 ENCOUNTER — Other Ambulatory Visit: Payer: PRIVATE HEALTH INSURANCE

## 2017-04-04 DIAGNOSIS — Z01411 Encounter for gynecological examination (general) (routine) with abnormal findings: Secondary | ICD-10-CM

## 2017-04-04 DIAGNOSIS — M8580 Other specified disorders of bone density and structure, unspecified site: Secondary | ICD-10-CM

## 2017-04-04 DIAGNOSIS — Z13220 Encounter for screening for lipoid disorders: Secondary | ICD-10-CM

## 2017-04-04 LAB — CBC WITH DIFFERENTIAL/PLATELET
Basophils Absolute: 79 {cells}/uL (ref 0–200)
Basophils Relative: 1 %
Eosinophils Absolute: 158 {cells}/uL (ref 15–500)
Eosinophils Relative: 2 %
HCT: 42.1 % (ref 35.0–45.0)
Hemoglobin: 13.8 g/dL (ref 11.7–15.5)
Lymphocytes Relative: 30 %
Lymphs Abs: 2370 {cells}/uL (ref 850–3900)
MCH: 29 pg (ref 27.0–33.0)
MCHC: 32.8 g/dL (ref 32.0–36.0)
MCV: 88.4 fL (ref 80.0–100.0)
MPV: 9.2 fL (ref 7.5–12.5)
Monocytes Absolute: 553 {cells}/uL (ref 200–950)
Monocytes Relative: 7 %
Neutro Abs: 4740 {cells}/uL (ref 1500–7800)
Neutrophils Relative %: 60 %
Platelets: 308 10*3/uL (ref 140–400)
RBC: 4.76 MIL/uL (ref 3.80–5.10)
RDW: 13.8 % (ref 11.0–15.0)
WBC: 7.9 10*3/uL (ref 3.8–10.8)

## 2017-04-04 LAB — LIPID PANEL
Cholesterol: 218 mg/dL — ABNORMAL HIGH
HDL: 65 mg/dL
LDL Cholesterol: 128 mg/dL — ABNORMAL HIGH
Total CHOL/HDL Ratio: 3.4 ratio
Triglycerides: 126 mg/dL
VLDL: 25 mg/dL

## 2017-04-04 LAB — COMPREHENSIVE METABOLIC PANEL WITH GFR
ALT: 12 U/L (ref 6–29)
AST: 15 U/L (ref 10–35)
Albumin: 3.9 g/dL (ref 3.6–5.1)
Alkaline Phosphatase: 79 U/L (ref 33–130)
BUN: 11 mg/dL (ref 7–25)
CO2: 26 mmol/L (ref 20–32)
Calcium: 8.5 mg/dL — ABNORMAL LOW (ref 8.6–10.4)
Chloride: 102 mmol/L (ref 98–110)
Creat: 0.52 mg/dL (ref 0.50–1.05)
Glucose, Bld: 102 mg/dL — ABNORMAL HIGH (ref 65–99)
Potassium: 4.5 mmol/L (ref 3.5–5.3)
Sodium: 138 mmol/L (ref 135–146)
Total Bilirubin: 0.4 mg/dL (ref 0.2–1.2)
Total Protein: 6.3 g/dL (ref 6.1–8.1)

## 2017-04-04 LAB — TSH: TSH: 0.8 m[IU]/L

## 2017-04-04 NOTE — Addendum Note (Signed)
Addended by: Joaquin Music on: 04/04/2017 08:35 AM   Modules accepted: Orders

## 2017-04-05 LAB — VITAMIN D 25 HYDROXY (VIT D DEFICIENCY, FRACTURES): VIT D 25 HYDROXY: 19 ng/mL — AB (ref 30–100)

## 2017-04-06 ENCOUNTER — Other Ambulatory Visit: Payer: Self-pay | Admitting: Gynecology

## 2017-04-06 DIAGNOSIS — R7309 Other abnormal glucose: Secondary | ICD-10-CM

## 2017-04-06 DIAGNOSIS — E559 Vitamin D deficiency, unspecified: Secondary | ICD-10-CM

## 2017-04-06 DIAGNOSIS — E7800 Pure hypercholesterolemia, unspecified: Secondary | ICD-10-CM

## 2017-04-06 MED ORDER — VITAMIN D (ERGOCALCIFEROL) 1.25 MG (50000 UNIT) PO CAPS: 50000.0000 [IU] | ORAL_CAPSULE | ORAL | 0 refills | Status: DC

## 2017-04-11 ENCOUNTER — Other Ambulatory Visit: Payer: Self-pay | Admitting: Emergency Medicine

## 2017-04-11 MED ORDER — DOXYCYCLINE HYCLATE 100 MG PO TABS: ORAL_TABLET | ORAL | 0 refills | Status: DC

## 2017-04-21 ENCOUNTER — Encounter: Payer: Self-pay | Admitting: Internal Medicine
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
## 2017-05-22 ENCOUNTER — Ambulatory Visit (INDEPENDENT_AMBULATORY_CARE_PROVIDER_SITE_OTHER): Payer: PRIVATE HEALTH INSURANCE | Admitting: Internal Medicine

## 2017-05-22 ENCOUNTER — Encounter: Payer: Self-pay | Admitting: Internal Medicine

## 2017-05-22 VITALS — BP 130/78 | HR 88 | Temp 97.9°F | Wt 157.2 lb

## 2017-05-22 DIAGNOSIS — M542 Cervicalgia: Secondary | ICD-10-CM

## 2017-05-22 DIAGNOSIS — R202 Paresthesia of skin: Secondary | ICD-10-CM | POA: Diagnosis not present

## 2017-05-22 DIAGNOSIS — R200 Anesthesia of skin: Secondary | ICD-10-CM

## 2017-05-22 DIAGNOSIS — Z23 Encounter for immunization: Secondary | ICD-10-CM | POA: Diagnosis not present

## 2017-05-22 MED ORDER — MELOXICAM 15 MG PO TABS: 15.0000 mg | ORAL_TABLET | Freq: Every day | ORAL | 0 refills | Status: DC

## 2017-05-22 MED ORDER — LOSARTAN POTASSIUM 25 MG PO TABS: 25.0000 mg | ORAL_TABLET | Freq: Every day | ORAL | 3 refills | Status: DC

## 2017-05-22 NOTE — Patient Instructions (Addendum)
I believe this is a pinched nerve in your neck that is giving you arm symptoms. We will try a different anti-inflammatory and have you look at your positioning posture and avoid neck extension. If this isn't improved in another 2-3 weeks or if it's worse in any time contact us consider prednisone imaging or further evaluation.   Paresthesia Paresthesia is an abnormal burning or prickling sensation. This sensation is generally felt in the hands, arms, legs, or feet. However, it may occur in any part of the body. Usually, it is not painful. The feeling may be described as:  Tingling or numbness.  Pins and needles.  Skin crawling.  Buzzing.  Limbs falling asleep.  Itching.  Most people experience temporary (transient) paresthesia at some time in their lives. Paresthesia may occur when you breathe too quickly (hyperventilation). It can also occur without any apparent cause. Commonly, paresthesia occurs when pressure is placed on a nerve. The sensation quickly goes away after the pressure is removed. For some people, however, paresthesia is a long-lasting (chronic) condition that is caused by an underlying disorder. If you continue to have paresthesia, you may need further medical evaluation. Follow these instructions at home: Watch your condition for any changes. Taking the following actions may help to lessen any discomfort that you are feeling:  Avoid drinking alcohol.  Try acupuncture or massage to help relieve your symptoms.  Keep all follow-up visits as directed by your health care provider. This is important.  Contact a health care provider if:  You continue to have episodes of paresthesia.  Your burning or prickling feeling gets worse when you walk.  You have pain, cramps, or dizziness.  You develop a rash. Get help right away if:  You feel weak.  You have trouble walking or moving.  You have problems with speech, understanding, or vision.  You feel confused.  You  cannot control your bladder or bowel movements.  You have numbness after an injury.  You faint. This information is not intended to replace advice given to you by your health care provider. Make sure you discuss any questions you have with your health care provider. Document Released: 07/08/2002 Document Revised: 12/24/2015 Document Reviewed: 07/14/2014 Elsevier Interactive Patient Education  [employer].

## 2017-06-19 ENCOUNTER — Telehealth: Payer: Self-pay | Admitting: Internal Medicine

## 2017-06-19 NOTE — Telephone Encounter (Signed)
Phone call from pt.  Reported update for Dr. Regis Bill following treatment with Meloxicam.  Reported she had pain starting in right and radiating down right arm into the thumb.  Stated there is no further pain.  Stated she continues to have an intermittent tingling in her right arm that affects the arm and thumb, but it occurs much less frequently.  Reported as far as positioning, she has tried to sit up much straighter.  Stated has 2 Meloxicam left, and is questioning if Dr. Regis Bill recommends any further treatment.    Advised will inform Dr. Regis Bill, and she will be notified of any further recommendation.  Pt. agreed.

## 2017-06-20 ENCOUNTER — Telehealth: Payer: Self-pay | Admitting: Internal Medicine

## 2017-06-20 NOTE — Telephone Encounter (Signed)
Spoke with pt and advised of Dr. Barbie Banner recommendation. She will stop Meloxicam. She states that Dr. Regis Bill had mentioned "changing to a stronger medication" and she would like to know what would be recommended at this point.  Pt aware Dr. Regis Bill not in office until tomorrow.  Dr. Regis Bill - Please advise. Thanks!

## 2017-06-20 NOTE — Telephone Encounter (Signed)
Pt was put on a "trial" of Meloxicam 05/22/17. Please advise if can refill.

## 2017-06-20 NOTE — Telephone Encounter (Signed)
LM x 1 for patient to give rec's per Dr Sarajane Jews

## 2017-06-20 NOTE — Telephone Encounter (Signed)
At this point I would finish out the Meloxicam and then wait and see what happens. It the symtoms return, she should follow up with Dr. Regis Bill.

## 2017-06-20 NOTE — Telephone Encounter (Signed)
Dr Sarajane Jews please advise if you feel that there are any further recommendations you can offer based on last OV recommendations and current ongoing symptoms :  Pt was given Meloxicam 15mg  - Take 1 tablet (15mg ) daily, #30 x 0 refills.     Patient Instructions by Burnis Medin, MD at 05/22/2017 10:00 AM  I believe this is a pinched nerve in your neck that is giving you arm symptoms. We will try a different anti-inflammatory and have you look at your positioning posture and avoid neck extension. If this isn't improved in another 2-3 weeks or if it's worse in any time contact us consider prednisone imaging or further evaluation.

## 2017-06-20 NOTE — Telephone Encounter (Unsigned)
Copied from [place] [id number]. Topic: General - Other >> Jun 20, 2017  4:42 PM Neva Seat wrote: Pt was experiencing tingling sensation. - Meloxitam  Pt is taking last pill of Meloxitam  Pt needs to know what she needs to do ASAP Pt traveling tomorrow

## 2017-06-20 NOTE — Telephone Encounter (Signed)
Please see TE dated 06/19/17.

## 2017-06-21 MED ORDER — PREDNISONE 10 MG PO TABS: ORAL_TABLET | ORAL | 0 refills | Status: DC

## 2017-06-21 NOTE — Telephone Encounter (Signed)
Please advise Dr Regis Bill on the instructions of the Prednisone taper, thanks.

## 2017-06-21 NOTE — Telephone Encounter (Signed)
Rx was pended on chart.  This has been sent to [facility].  Pt aware. Nothing further needed.

## 2017-06-21 NOTE — Telephone Encounter (Signed)
I would have [place] try a prednisone taper and if still not improved will need more evaluation imaging.   Neuro and or ns eval  Send in to pharmacy of her choice

## 2017-06-21 NOTE — Telephone Encounter (Signed)
Pt called back and would like to speak further with Dr Regis Bill about her symptoms. Pt is questioning the answer she received. Wants to hear from Dr Regis Bill and not another dr.  [phone]

## 2017-06-27 ENCOUNTER — Other Ambulatory Visit: Payer: Self-pay

## 2017-07-21 ENCOUNTER — Ambulatory Visit (INDEPENDENT_AMBULATORY_CARE_PROVIDER_SITE_OTHER): Payer: PRIVATE HEALTH INSURANCE | Admitting: Family Medicine

## 2017-07-21 ENCOUNTER — Encounter: Payer: Self-pay | Admitting: Family Medicine

## 2017-07-21 VITALS — BP 142/80 | HR 100 | Temp 98.3°F | Resp 12 | Ht <= 58 in | Wt 159.4 lb

## 2017-07-21 DIAGNOSIS — R0989 Other specified symptoms and signs involving the circulatory and respiratory systems: Secondary | ICD-10-CM | POA: Diagnosis not present

## 2017-07-21 DIAGNOSIS — R030 Elevated blood-pressure reading, without diagnosis of hypertension: Secondary | ICD-10-CM | POA: Diagnosis not present

## 2017-07-21 DIAGNOSIS — R109 Unspecified abdominal pain: Secondary | ICD-10-CM | POA: Diagnosis not present

## 2017-07-21 DIAGNOSIS — J069 Acute upper respiratory infection, unspecified: Secondary | ICD-10-CM

## 2017-07-21 DIAGNOSIS — J989 Respiratory disorder, unspecified: Secondary | ICD-10-CM

## 2017-07-21 MED ORDER — PREDNISONE 20 MG PO TABS: 40.0000 mg | ORAL_TABLET | Freq: Every day | ORAL | 0 refills | Status: AC

## 2017-07-21 MED ORDER — PROAIR HFA 108 (90 BASE) MCG/ACT IN AERS: 2.0000 | INHALATION_SPRAY | Freq: Four times a day (QID) | RESPIRATORY_TRACT | 0 refills | Status: DC | PRN
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
## 2017-07-21 NOTE — Patient Instructions (Addendum)
  [name] I have seen you today for an acute visit.  A few things to remember from today's visit:   Abdominal wall pain  Reactive airway disease that is not asthma - Plan: predniSONE (DELTASONE) 20 MG tablet, PROAIR HFA 108 (90 Base) MCG/ACT inhaler  URI, acute  Elevated blood pressure reading   Medications prescribed today are intended for short period of time and will not be refill upon request, a follow up appointment might be necessary to discuss continuation of of treatment if appropriate.  Albuterol inh 2 puff every 6 hours for a week then as needed for wheezing or shortness of breath.  Prednisone with breakfast. Icy hot or Asper cream on abdomen, seems muscle related.   Monitor blood pressure at home.    In general please monitor for signs of worsening symptoms and seek immediate medical attention if any concerning.    I hope you get better soon!
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
## 2017-08-09 ENCOUNTER — Ambulatory Visit (INDEPENDENT_AMBULATORY_CARE_PROVIDER_SITE_OTHER): Payer: PRIVATE HEALTH INSURANCE | Admitting: Internal Medicine

## 2017-08-09 ENCOUNTER — Encounter: Payer: Self-pay | Admitting: Internal Medicine

## 2017-08-09 VITALS — BP 124/80 | HR 88 | Temp 98.1°F | Wt 161.6 lb

## 2017-08-09 DIAGNOSIS — R109 Unspecified abdominal pain: Secondary | ICD-10-CM

## 2017-08-09 DIAGNOSIS — R0789 Other chest pain: Secondary | ICD-10-CM

## 2017-08-09 DIAGNOSIS — R071 Chest pain on breathing: Secondary | ICD-10-CM

## 2017-08-09 NOTE — Patient Instructions (Signed)
This seems like a cartilage     Joint  Strain   Injury       And should improve  With another few weeks.    If not getting   Better    In next few weeks   Or worse  then can reassess.

## 2017-10-07 ENCOUNTER — Other Ambulatory Visit: Payer: Self-pay | Admitting: Internal Medicine

## 2017-11-23 ENCOUNTER — Encounter: Payer: Self-pay | Admitting: Internal Medicine

## 2017-11-23 ENCOUNTER — Ambulatory Visit (INDEPENDENT_AMBULATORY_CARE_PROVIDER_SITE_OTHER): Payer: PRIVATE HEALTH INSURANCE | Admitting: Internal Medicine

## 2017-11-23 ENCOUNTER — Telehealth: Payer: Self-pay | Admitting: Internal Medicine

## 2017-11-23 VITALS — BP 134/86 | HR 101 | Temp 98.2°F | Wt 158.8 lb

## 2017-11-23 DIAGNOSIS — J019 Acute sinusitis, unspecified: Secondary | ICD-10-CM | POA: Diagnosis not present

## 2017-11-23 DIAGNOSIS — J069 Acute upper respiratory infection, unspecified: Secondary | ICD-10-CM

## 2017-11-23 DIAGNOSIS — J309 Allergic rhinitis, unspecified: Secondary | ICD-10-CM

## 2017-11-23 DIAGNOSIS — J988 Other specified respiratory disorders: Secondary | ICD-10-CM | POA: Diagnosis not present

## 2017-11-23 MED ORDER — PREDNISONE 20 MG PO TABS: 20.0000 mg | ORAL_TABLET | Freq: Two times a day (BID) | ORAL | 0 refills | Status: DC

## 2017-11-23 MED ORDER — DOXYCYCLINE HYCLATE 100 MG PO TABS: 100.0000 mg | ORAL_TABLET | Freq: Two times a day (BID) | ORAL | 0 refills | Status: DC

## 2017-11-23 MED ORDER — CHLORPHENIRAMINE-DM 2-15 MG/15ML PO LIQD: ORAL | 1 refills | Status: DC

## 2017-11-23 MED ORDER — PROMETHAZINE-DM 6.25-15 MG/5ML PO SYRP: ORAL_SOLUTION | ORAL | 0 refills | Status: DC

## 2017-11-23 NOTE — Telephone Encounter (Signed)
Pt aware of rec's per Dr Regis Bill. Pt is going to discuss with pharmacy about alternatives as well. Nothing further needed.
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
## 2017-11-23 NOTE — Patient Instructions (Addendum)
This could be   Allergy and a viral  infection but  Possible   Sinus infection .       On top of this may response to  Antibiotic .   Use the albuterol every 6 hours as long as wheezing cough  Advise  flonase  Or any nasal cortisone to help the nose congestion . In addition to saline . Nose spray  Adding  prednisone if   Continued wheezing   .

## 2017-11-23 NOTE — Telephone Encounter (Signed)
This is an OTc med similar   If avaialbele

## 2017-11-23 NOTE — Telephone Encounter (Signed)
Please advise Dr Panosh, thanks.   

## 2017-11-23 NOTE — Telephone Encounter (Signed)
Copied from [place] [id number]. Topic: Quick Communication - See Telephone Encounter >> Nov 23, 2017 11:43 AM Synthia Innocent wrote: CRM for notification. See Telephone encounter for: 11/23/17. Patient states that she was told by pharmacy that promethazine-dextromethorphan (PROMETHAZINE-DM) 6.25-15 MG/5ML syrup is on back order, requesting something else to be called in . Please advise

## 2017-11-29 ENCOUNTER — Ambulatory Visit: Payer: Self-pay | Admitting: *Deleted

## 2017-11-29 ENCOUNTER — Telehealth: Payer: Self-pay | Admitting: Internal Medicine

## 2017-11-29 DIAGNOSIS — R05 Cough: Secondary | ICD-10-CM

## 2017-11-29 DIAGNOSIS — R062 Wheezing: Secondary | ICD-10-CM

## 2017-11-29 DIAGNOSIS — R059 Cough, unspecified: Secondary | ICD-10-CM

## 2017-11-29 NOTE — Telephone Encounter (Signed)
I agree

## 2017-11-29 NOTE — Telephone Encounter (Signed)
Per Dr Regis Bill, needs CXR and OV tomorrow.   Called patient and she is unable to come in for the appt this week d/t graduation at school and needing to be present. She will however go ahead and get the STAT CXR tomorrow first thing and is aware that we will call her with the results and next steps.   Will send to Dr Regis Bill as Juluis Rainier

## 2017-11-29 NOTE — Telephone Encounter (Signed)
Copied from [place] [id number]. Topic: Quick Communication - See Telephone Encounter >> Nov 29, 2017  3:03 PM Vernona Rieger wrote: CRM for notification. See Telephone encounter for: 11/29/17.  Patient is following up from her triage call today in regards to her medication. She said that she is not feeling any better and wants a nurse to call her back please  [phone]

## 2017-11-29 NOTE — Telephone Encounter (Signed)
Copied from [place] [id number]. Topic: Quick Communication - See Telephone Encounter >> Nov 29, 2017  3:03 PM Vernona Rieger wrote: CRM for notification. See Telephone encounter for: 11/29/17.  Patient is following up from her triage call today in regards to her medication. She said that she is not feeling any better and wants a nurse to call her back please  [phone]

## 2017-11-29 NOTE — Telephone Encounter (Signed)
Patient saw Dr. Regis Bill on 4/25 dx URI . She continues to have a deep cough that is keeping her awake at night. Minimal phlegm is white, thick and sticky. She reports wheezing on exhale even after completing the prednisone. She continues to be short of breath with talking and activity. NO fever, denies chest pain. She completes Doxycycline today. She continues to use the albuterol and flonase. Promethazine-DM is on back order at her pharmacy so she has been using Tussin DM which is not helping now. Please advise.  Answer Assessment - Initial Assessment Questions 1. REASON FOR CALL or QUESTION: "What is your reason for calling today?" or "How can I best help you?" or "What question do you have that I can help answer?"     Some symptoms persists after 4/25 office visit. SEE NOTE  Protocols used: INFORMATION ONLY CALL-A-AH

## 2017-11-30 ENCOUNTER — Ambulatory Visit (INDEPENDENT_AMBULATORY_CARE_PROVIDER_SITE_OTHER)
Admission: RE | Admit: 2017-11-30 | Discharge: 2017-11-30 | Disposition: A | Discharge status: Home / Self Care | Payer: PRIVATE HEALTH INSURANCE | Source: Ambulatory Visit | Attending: Internal Medicine | Admitting: Internal Medicine

## 2017-11-30 DIAGNOSIS — R062 Wheezing: Secondary | ICD-10-CM

## 2017-11-30 DIAGNOSIS — R05 Cough: Secondary | ICD-10-CM

## 2017-11-30 DIAGNOSIS — R059 Cough, unspecified: Secondary | ICD-10-CM

## 2017-11-30 MED ORDER — BENZONATATE 100 MG PO CAPS: 100.0000 mg | ORAL_CAPSULE | Freq: Three times a day (TID) | ORAL | 0 refills | Status: DC | PRN

## 2017-11-30 MED ORDER — HYDROCODONE-HOMATROPINE 5-1.5 MG/5ML PO SYRP: 5.0000 mL | ORAL_SOLUTION | ORAL | 0 refills | Status: AC | PRN

## 2017-12-11 ENCOUNTER — Encounter: Payer: Self-pay | Admitting: Internal Medicine

## 2017-12-11 ENCOUNTER — Ambulatory Visit (INDEPENDENT_AMBULATORY_CARE_PROVIDER_SITE_OTHER): Payer: PRIVATE HEALTH INSURANCE | Admitting: Internal Medicine

## 2017-12-11 VITALS — BP 124/68 | HR 100 | Temp 97.6°F | Wt 157.0 lb

## 2017-12-11 DIAGNOSIS — R053 Chronic cough: Secondary | ICD-10-CM

## 2017-12-11 DIAGNOSIS — R05 Cough: Secondary | ICD-10-CM | POA: Diagnosis not present

## 2017-12-11 DIAGNOSIS — J309 Allergic rhinitis, unspecified: Secondary | ICD-10-CM | POA: Diagnosis not present

## 2017-12-11 DIAGNOSIS — R0989 Other specified symptoms and signs involving the circulatory and respiratory systems: Secondary | ICD-10-CM

## 2017-12-11 DIAGNOSIS — R5383 Other fatigue: Secondary | ICD-10-CM | POA: Diagnosis not present

## 2017-12-11 DIAGNOSIS — R0602 Shortness of breath: Secondary | ICD-10-CM

## 2017-12-11 DIAGNOSIS — J989 Respiratory disorder, unspecified: Secondary | ICD-10-CM

## 2017-12-11 LAB — COMPREHENSIVE METABOLIC PANEL
ALT: 15 U/L (ref 0–35)
AST: 18 U/L (ref 0–37)
Albumin: 3.8 g/dL (ref 3.5–5.2)
Alkaline Phosphatase: 69 U/L (ref 39–117)
BILIRUBIN TOTAL: 0.4 mg/dL (ref 0.2–1.2)
BUN: 11 mg/dL (ref 6–23)
CALCIUM: 8.9 mg/dL (ref 8.4–10.5)
CO2: 33 meq/L — AB (ref 19–32)
CREATININE: 0.61 mg/dL (ref 0.40–1.20)
Chloride: 101 mEq/L (ref 96–112)
GFR: 106.65 mL/min (ref >60.00)
Glucose, Bld: 131 mg/dL — ABNORMAL HIGH (ref 70–99)
Potassium: 3.9 mEq/L (ref 3.5–5.1)
Sodium: 142 mEq/L (ref 135–145)
Total Protein: 6.5 g/dL (ref 6.0–8.3)

## 2017-12-11 LAB — CBC WITH DIFFERENTIAL/PLATELET
BASOS PCT: 0.5 % (ref 0.0–3.0)
Basophils Absolute: 0.1 10*3/uL (ref 0.0–0.1)
Eosinophils Absolute: 0.2 10*3/uL (ref 0.0–0.7)
Eosinophils Relative: 1.9 % (ref 0.0–5.0)
HEMATOCRIT: 43.9 % (ref 36.0–46.0)
Hemoglobin: 14.4 g/dL (ref 12.0–15.0)
LYMPHS ABS: 3.1 10*3/uL (ref 0.7–4.0)
LYMPHS PCT: 27.2 % (ref 12.0–46.0)
MCHC: 32.9 g/dL (ref 30.0–36.0)
MCV: 87.4 fl (ref 78.0–100.0)
MONO ABS: 0.6 10*3/uL (ref 0.1–1.0)
Monocytes Relative: 5.4 % (ref 3.0–12.0)
NEUTROS ABS: 7.5 10*3/uL (ref 1.4–7.7)
NEUTROS PCT: 65 % (ref 43.0–77.0)
PLATELETS: 280 10*3/uL (ref 150.0–400.0)
RBC: 5.02 Mil/uL (ref 3.87–5.11)
RDW: 13.8 % (ref 11.5–15.5)
WBC: 11.5 10*3/uL — ABNORMAL HIGH (ref 4.0–10.5)

## 2017-12-11 LAB — HEMOGLOBIN A1C: Hgb A1c MFr Bld: 6.7 % — ABNORMAL HIGH (ref 4.6–6.5)

## 2017-12-11 LAB — SEDIMENTATION RATE: Sed Rate: 16 mm/hr (ref 0–30)

## 2017-12-11 MED ORDER — METHYLPREDNISOLONE ACETATE 80 MG/ML IJ SUSP
120.0000 mg | Freq: Once | INTRAMUSCULAR | Status: AC
  Administered 2017-12-11: 120 mg via INTRAMUSCULAR

## 2017-12-11 MED ORDER — MONTELUKAST SODIUM 10 MG PO TABS: 10.0000 mg | ORAL_TABLET | Freq: Every day | ORAL | 3 refills | Status: DC

## 2017-12-11 MED ORDER — BUDESONIDE-FORMOTEROL FUMARATE 160-4.5 MCG/ACT IN AERO: 2.0000 | INHALATION_SPRAY | Freq: Two times a day (BID) | RESPIRATORY_TRACT | 3 refills | Status: DC

## 2017-12-11 NOTE — Patient Instructions (Addendum)
Blood work today   Microbiologist and add Singulair   Steroid  Injection today to help the wheezing   Will to  Pulmonary   As discussed   Poss need for PFTS when better  Or better plan for recurrent coughng illnesses .
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
## 2017-12-12 LAB — TSH: TSH: 1.17 u[IU]/mL (ref 0.35–4.50)

## 2017-12-12 LAB — T4, FREE: Free T4: 0.9 ng/dL (ref 0.60–1.60)

## 2017-12-19 ENCOUNTER — Ambulatory Visit (INDEPENDENT_AMBULATORY_CARE_PROVIDER_SITE_OTHER): Payer: PRIVATE HEALTH INSURANCE | Admitting: Internal Medicine

## 2017-12-19 ENCOUNTER — Encounter: Payer: Self-pay | Admitting: Internal Medicine

## 2017-12-19 ENCOUNTER — Other Ambulatory Visit (INDEPENDENT_AMBULATORY_CARE_PROVIDER_SITE_OTHER): Payer: PRIVATE HEALTH INSURANCE

## 2017-12-19 VITALS — BP 140/84 | HR 86 | Ht <= 58 in | Wt 153.0 lb

## 2017-12-19 DIAGNOSIS — J45991 Cough variant asthma: Secondary | ICD-10-CM

## 2017-12-19 LAB — CBC WITH DIFFERENTIAL/PLATELET
BASOS PCT: 1 % (ref 0.0–3.0)
Basophils Absolute: 0.1 10*3/uL (ref 0.0–0.1)
EOS PCT: 1.3 % (ref 0.0–5.0)
Eosinophils Absolute: 0.1 10*3/uL (ref 0.0–0.7)
HEMATOCRIT: 43.6 % (ref 36.0–46.0)
HEMOGLOBIN: 14.4 g/dL (ref 12.0–15.0)
Lymphocytes Relative: 30.4 % (ref 12.0–46.0)
Lymphs Abs: 3.3 10*3/uL (ref 0.7–4.0)
MCHC: 33.1 g/dL (ref 30.0–36.0)
MCV: 87.3 fl (ref 78.0–100.0)
MONO ABS: 0.8 10*3/uL (ref 0.1–1.0)
MONOS PCT: 7.1 % (ref 3.0–12.0)
Neutro Abs: 6.5 10*3/uL (ref 1.4–7.7)
Neutrophils Relative %: 60.2 % (ref 43.0–77.0)
Platelets: 334 10*3/uL (ref 150.0–400.0)
RBC: 5 Mil/uL (ref 3.87–5.11)
RDW: 14 % (ref 11.5–15.5)
WBC: 10.8 10*3/uL — AB (ref 4.0–10.5)

## 2017-12-19 LAB — NITRIC OXIDE: Nitric Oxide: 12

## 2017-12-19 MED ORDER — BENZONATATE 200 MG PO CAPS: 200.0000 mg | ORAL_CAPSULE | Freq: Three times a day (TID) | ORAL | 1 refills | Status: DC | PRN

## 2017-12-19 MED ORDER — TRAMADOL HCL 50 MG PO TABS: 50.0000 mg | ORAL_TABLET | ORAL | 0 refills | Status: AC | PRN

## 2017-12-19 MED ORDER — PANTOPRAZOLE SODIUM 40 MG PO TBEC: 40.0000 mg | DELAYED_RELEASE_TABLET | Freq: Every day | ORAL | 2 refills | Status: DC

## 2017-12-19 MED ORDER — PREDNISONE 10 MG PO TABS: ORAL_TABLET | ORAL | 0 refills | Status: DC

## 2017-12-19 MED ORDER — BUDESONIDE-FORMOTEROL FUMARATE 80-4.5 MCG/ACT IN AERO: 2.0000 | INHALATION_SPRAY | Freq: Two times a day (BID) | RESPIRATORY_TRACT | 11 refills | Status: DC

## 2017-12-19 MED ORDER — FAMOTIDINE 20 MG PO TABS: ORAL_TABLET | ORAL | 11 refills | Status: DC

## 2017-12-19 NOTE — Patient Instructions (Signed)
symbicort change to 80 strength and take up to 2 every 12 hours if it's helping   Work on inhaler technique:  relax and gently blow all the way out then take a nice smooth deep breath back in, triggering the inhaler at same time you start breathing in.  Hold for up to 5 seconds if you can. Blow out thru nose. Rinse and gargle with water when done      The key to effective treatment for your cough is eliminating the non-stop cycle of cough you're stuck in long enough to let your airway heal completely and then see if there is anything still making you cough once you stop the cough suppression, but this should take no more than 5 days to figure out  First take delsym two tsp every 12 hours and supplement if needed with  tramadol 50 mg up to 1 every 4 hours to suppress the urge to cough at all or even clear your throat. Swallowing water or using ice chips/non mint and menthol containing candies (such as lifesavers or sugarless jolly ranchers) are also effective.  You should rest your voice and avoid activities that you know make you cough.  Once you have eliminated the cough for 3 straight days try reducing the tramadol first,  then the delsym as tolerated.    Prednisone 10 mg take  4 each am x 2 days,   2 each am x 2 days,  1 each am x 2 days and stop (this is to eliminate allergies and inflammation from coughing)  Protonix (pantoprazole) Take 30-60 min before first meal of the day and Pepcid 20 mg one bedtime plus chlorpheniramine 4 mg x 2 at bedtime (both available over the counter)  until cough is completely gone for at least a week without the need for cough suppression  GERD (REFLUX)  is an extremely common cause of respiratory symptoms, many times with no significant heartburn at all.    It can be treated with medication, but also with lifestyle changes including avoidance of late meals, excessive alcohol, smoking cessation, and avoid fatty foods, chocolate, peppermint, colas, red wine, and  acidic juices such as orange juice.  NO MINT OR MENTHOL PRODUCTS SO NO COUGH DROPS   USE HARD CANDY INSTEAD (jolley ranchers or Stover's or Lifesavers (all available in sugarless versions) NO OIL BASED VITAMINS - use powdered substitutes.  Ok to use tessalon pearls for milder cough up to every 8 hours as needed (won't make you sleepy)    If you are satisfied with your treatment plan,  let your doctor know and he/she can either refill your medications or you can return here when your prescription runs out.     If in any way you are not 100% satisfied,  please tell us.  If 100% better, tell your friends!  Pulmonary follow up is as needed

## 2017-12-19 NOTE — Progress Notes (Signed)
Subjective:     Patient ID: [name], female   DOB: 1961-06-05,    MRN: [id number]  HPI  57 yo [place] female Mudlogger of dining for [employer] never smoker with tendency to cough toward end of trips to [place] that  last 2-3 weeks worse after travel back to [place] x 3 trips starting around 2014 referred to pulmonary clinic 12/19/2017 by Dr   Regis Bill with new cough started end of March 2019 s antecedent travel    During the pollen season has noted cough/ wheeze in past but this is different sensation    12/19/2017 1st [facility] office visit/ [name]   Chief Complaint  Patient presents with  . Pulmonary Consult    Referred by Dr. Regis Bill. Pt c/o SOB for the past 2 months. She gets winded if she talks to much. She also gets SOB with walking but unable to say how far she walks before she notices a problem.   cough came on abruptly and of March 2018 with rhinitis/nasal discharge/ some sneezing with subjective wheezing   rx pred/delsym/ proair (not very helpful) changed to symb/singulair and gradually improved but still harsh coughing fits sporadic pattern        Kouffman Reflux v Neurogenic Cough Differentiator Reflux Comments  Do you awaken from a sound sleep coughing violently?                            With trouble breathing? Yes   Do you have choking episodes when you cannot  Get enough air, gasping for air ?              NO    Do you usually cough when you lie down into  The bed, or when you just lie down to rest ?                          Not really    Do you usually cough after meals or eating?         Worse    Do you cough when (or after) you bend over?    No    GERD SCORE     Kouffman Reflux v Neurogenic Cough Differentiator Neurogenic   Do you more-or-less cough all day long? Sporadic    Does change of temperature make you cough? no   Does laughing or chuckling cause you to cough? no   Do fumes (perfume, automobile fumes, burned  Toast, etc.,) cause you to cough ?       no   Does speaking, singing, or talking on the phone cause you to cough   ?               No    Neurogenic/Airway score         Unless coughing not sob    NO  obvious day to day or daytime variability or assoc excess/ purulent sputum or mucus plugs or hemoptysis or cp or chest tightness, subjective wheeze or overt sinus or hb symptoms. No unusual exposure hx or h/o childhood pna/ asthma or knowledge of premature birth.  Sleeping ok flat without nocturnal  or early am exacerbation  of respiratory  c/o's or need for noct saba. Also denies any obvious fluctuation of symptoms with weather or environmental changes or other aggravating or alleviating factors except as outlined above   Current Allergies, Complete Past Medical History, Past Surgical  History, Family History, and Social History were reviewed in [employer] record.  ROS  The following are not active complaints unless bolded Hoarseness, sore throat, dysphagia, dental problems, itching, sneezing,  nasal congestion or discharge of excess mucus or purulent secretions, ear ache,   fever, chills, sweats, unintended wt loss or wt gain, classically pleuritic or exertional cp,  orthopnea pnd or leg swelling, presyncope, palpitations, abdominal pain, anorexia, nausea, vomiting, diarrhea  or change in bowel habits or change in bladder habits, change in stools or change in urine, dysuria, hematuria,  rash, arthralgias, visual complaints, headache, numbness, weakness or ataxia or problems with walking or coordination,  change in mood/affect or memory.        Current Meds  Medication Sig  . acetaminophen (TYLENOL) 325 MG tablet Take 650 mg by mouth every 6 (six) hours as needed for mild pain or moderate pain.   . Biotin 1 MG CAPS Take 1 capsule by mouth.  . budesonide-formoterol (SYMBICORT) 160-4.5 MCG/ACT inhaler Inhale 2 puffs into the lungs 2 (two) times daily.  . Calcium Carbonate-Vit D-Min (CALTRATE PLUS PO) Take 1  tablet by mouth daily.   Marland Kitchen losartan (COZAAR) 25 MG tablet Take 25 mg by mouth daily.  . montelukast (SINGULAIR) 10 MG tablet Take 1 tablet (10 mg total) by mouth at bedtime.  Marland Kitchen PROAIR HFA 108 (90 Base) MCG/ACT inhaler Inhale 2 puffs into the lungs every 6 (six) hours as needed for wheezing or shortness of breath.        Review of Systems     Objective:   Physical Exam    amb [place] female very harsh upper airway cough   Wt Readings from Last 3 Encounters:  12/19/17 153 lb (69.4 kg)  12/11/17 157 lb (71.2 kg)  11/23/17 158 lb 12.8 oz (72 kg)     Vital signs reviewed - Note on arrival 02 sats  96% on RA       HEENT: nl dentition, turbinates bilaterally, and oropharynx. Nl external ear canals without cough reflex   NECK :  without JVD/Nodes/TM/ nl carotid upstrokes bilaterally   LUNGS: no acc muscle use,  Nl contour chest which is clear to A and P bilaterally without cough on insp or exp maneuvers   CV:  RRR  no s3 or murmur or increase in P2, and no edema   ABD:  soft and nontender with nl inspiratory excursion in the supine position. No bruits or organomegaly appreciated, bowel sounds nl  MS:  Nl gait/ ext warm without deformities, calf tenderness, cyanosis or clubbing No obvious joint restrictions   SKIN: warm and dry without lesions    NEURO:  alert, approp, nl sensorium with  no motor or cerebellar deficits apparent.      I personally reviewed images and agree with radiology impression as follows:  CXR:   11/30/17 No active cardiopulmonary disease.   Labs ordered 12/19/2017  Allergy profile      Assessment:

## 2017-12-19 NOTE — Assessment & Plan Note (Addendum)
FENO 12/19/2017  = 12 on singulair  symb 160 2bid but none on day of ov and hfa baseline quite poor  - 12/19/2017  After extensive coaching inhaler device  effectiveness =    50% from a baseline of 0> try symb 80 2bid x 2 week sample   - Allergy profile 12/19/2017 >  Eos 0.1 /  IgE  Pending   The most common causes of chronic cough in immunocompetent adults include the following: upper airway cough syndrome (UACS), previously referred to as postnasal drip syndrome (PNDS), which is caused by variety of rhinosinus conditions; (2) asthma; (3) GERD; (4) chronic bronchitis from cigarette smoking or other inhaled environmental irritants; (5) nonasthmatic eosinophilic bronchitis; and (6) bronchiectasis.   These conditions, singly or in combination, have accounted for up to 94% of the causes of chronic cough in prospective studies.   Other conditions have constituted no >6% of the causes in prospective studies These have included bronchogenic carcinoma, chronic interstitial pneumonia, sarcoidosis, left ventricular failure, ACEI-induced cough, and aspiration from a condition associated with pharyngeal dysfunction.    Chronic cough is often simultaneously caused by more than one condition. A single cause has been found from 38 to 82% of the time, multiple causes from 18 to 62%. Multiply caused cough has been the result of three diseases up to 42% of the time.       Based on how poorly she demonstrated the use of Symbicort today I don't really think it could be helping her very much and therefore recommended she try a lower strength to take up to every 12 hours(which is less likely to trigger uacs than high dose ICS)  but should focus at least in the short run on totally eliminating what I perceive to be a classical cyclical cough pattern and continue singulair maint through her allergy season to see what if any difference this makes in her rhinitis or perception of postnasal drip syndrome which actually may be  part of the upper airway cough syndrome and not actually due to excess postnasal drainage from rhinitis.  Of the three most common causes of  Sub-acute / recurrent or chronic cough, only one (GERD)  can actually contribute to/ trigger  the other two (asthma and post nasal drip syndrome)  and perpetuate the cylce of cough.  While not intuitively obvious, many patients with chronic low grade reflux do not cough until there is a primary insult that disturbs the protective epithelial barrier and exposes sensitive nerve endings.   This is typically viral but can due to PNDS and  either may apply here.   The point is that once this occurs, it is difficult to eliminate the cycle  using anything but a maximally effective acid suppression regimen at least in the short run, accompanied by an appropriate diet to address non acid GERD and control / eliminate the cough itself for at least 3 days with tramadol and For drainage / throat tickle try take CHLORPHENIRAMINE  4 mg - take one every 4 hours as needed - available over the counter- may cause drowsiness so start with just a bedtime dose or two and see how you tolerate it before trying in daytime     If not improved the next step would be to consider a trial of gabapentin and perhaps complete the workup with a sinus CT scan and a methacholine challenge to be sure this is not active cough variant asthma.   Total time devoted to counseling  >  50 % of initial 60 min office visit:  review case with pt/ discussion of options/alternatives/ personally creating written customized instructions  in presence of pt  then going over those specific  Instructions directly with the pt including how to use all of the meds but in particular covering each new medication in detail and the difference between the maintenance= "automatic" meds and the prns using an action plan format for the latter (If this problem/symptom => do that organization reading Left to right).  Please see AVS  from this visit for a full list of these instructions which I personally wrote for this pt and  are unique to this visit.   See device teaching which extended face to face time for this visit

## 2017-12-20 ENCOUNTER — Encounter: Payer: Self-pay | Admitting: Internal Medicine

## 2017-12-20 LAB — RESPIRATORY ALLERGY PROFILE REGION II ~~LOC~~
Allergen, D pternoyssinus,d7: <0.1 kU/L
Allergen, Mouse Urine Protein, e78: <0.1 kU/L
Allergen, Oak,t7: <0.1 kU/L
Allergen, P. notatum, m1: <0.1 kU/L
Bermuda Grass: <0.1 kU/L
Box Elder IgE: <0.1 kU/L
CLASS: 0
CLASS: 0
CLASS: 0
CLASS: 0
CLASS: 0
CLASS: 0
CLASS: 0
CLASS: 0
CLASS: 0
CLASS: 0
CLASS: 0
COMMON RAGWEED (SHORT) (W1) IGE: <0.1 kU/L
Class: 0
Class: 0
Class: 0
Class: 0
Class: 0
Class: 0
Class: 0
Class: 0
Class: 0
Class: 0
Class: 0
Class: 0
Class: 0
D. farinae: <0.1 kU/L
Elm IgE: <0.1 kU/L
IgE (Immunoglobulin E), Serum: 44 kU/L (ref <=114)
Johnson Grass: <0.1 kU/L
Timothy Grass: <0.1 kU/L

## 2017-12-20 LAB — INTERPRETATION:

## 2017-12-21 NOTE — Progress Notes (Signed)
Left detailed msg with results ok per DPR 

## 2018-01-06 ENCOUNTER — Other Ambulatory Visit: Payer: Self-pay | Admitting: Internal Medicine

## 2018-01-10 NOTE — Telephone Encounter (Signed)
Medication was listed as historical, okay to fill?

## 2018-01-10 NOTE — Telephone Encounter (Signed)
refilled 

## 2018-02-27 ENCOUNTER — Encounter: Payer: Self-pay | Admitting: Internal Medicine

## 2018-02-28 MED ORDER — LOSARTAN POTASSIUM 25 MG PO TABS: ORAL_TABLET | ORAL | 1 refills | Status: DC
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
## 2018-03-13 ENCOUNTER — Ambulatory Visit (INDEPENDENT_AMBULATORY_CARE_PROVIDER_SITE_OTHER): Payer: PRIVATE HEALTH INSURANCE | Admitting: Internal Medicine

## 2018-03-13 ENCOUNTER — Encounter: Payer: Self-pay | Admitting: Internal Medicine

## 2018-03-13 VITALS — BP 118/70 | HR 101 | Temp 98.3°F | Wt 159.5 lb

## 2018-03-13 DIAGNOSIS — J069 Acute upper respiratory infection, unspecified: Secondary | ICD-10-CM | POA: Diagnosis not present

## 2018-03-13 DIAGNOSIS — J988 Other specified respiratory disorders: Secondary | ICD-10-CM | POA: Diagnosis not present

## 2018-03-13 NOTE — Patient Instructions (Addendum)
  This could be  Another viral respiratory infection that will runs its course .   But want you to finish the antibiotic  Continue restarted Symbicort and then the Singulair    If not better in 2-3 weeks  Then plan fu here or dr Melvyn Novas pulmonary again

## 2018-04-01 DIAGNOSIS — M8580 Other specified disorders of bone density and structure, unspecified site: Secondary | ICD-10-CM

## 2018-04-01 HISTORY — DX: Other specified disorders of bone density and structure, unspecified site: M85.80

## 2018-04-10 ENCOUNTER — Ambulatory Visit (INDEPENDENT_AMBULATORY_CARE_PROVIDER_SITE_OTHER): Payer: BLUE CROSS/BLUE SHIELD | Admitting: Gynecology

## 2018-04-10 ENCOUNTER — Encounter: Payer: Self-pay | Admitting: Gynecology

## 2018-04-10 VITALS — BP 132/80 | Ht 59.5 in | Wt 159.0 lb

## 2018-04-10 DIAGNOSIS — R5382 Chronic fatigue, unspecified: Secondary | ICD-10-CM | POA: Diagnosis not present

## 2018-04-10 DIAGNOSIS — Z1151 Encounter for screening for human papillomavirus (HPV): Secondary | ICD-10-CM

## 2018-04-10 DIAGNOSIS — M8580 Other specified disorders of bone density and structure, unspecified site: Secondary | ICD-10-CM

## 2018-04-10 DIAGNOSIS — Z124 Encounter for screening for malignant neoplasm of cervix: Secondary | ICD-10-CM

## 2018-04-10 DIAGNOSIS — E559 Vitamin D deficiency, unspecified: Secondary | ICD-10-CM

## 2018-04-10 DIAGNOSIS — Z13220 Encounter for screening for lipoid disorders: Secondary | ICD-10-CM | POA: Diagnosis not present

## 2018-04-10 NOTE — Addendum Note (Signed)
Addended by: Thurnell Garbe A on: 04/10/2018 05:18 PM   Modules accepted: Orders

## 2018-04-10 NOTE — Progress Notes (Signed)
L 

## 2018-04-10 NOTE — Patient Instructions (Signed)
Follow-up for fasting blood work as ordered  Follow-up for bone density as scheduled

## 2018-04-10 NOTE — Progress Notes (Signed)
    [name] 04/09/1961 [id number]        57 y.o.  [id number] for annual gynecologic exam.  Patient notes increasing fatigue.  No weight changes skin or hair changes.  Feels that it is due to a low vitamin D which she has taken supplemental 50,000 units weekly in the past and knows when her vitamin D level drifts low she tends to feel fatigued.  No nausea vomiting diarrhea constipation.  Past medical history,surgical history, problem list, medications, allergies, family history and social history were all reviewed and documented as reviewed in the EPIC chart.  ROS:  Performed with pertinent positives and negatives included in the history, assessment and plan.   Additional significant findings : None   Exam: Copywriter, advertising Vitals:   04/10/18 1637  BP: 132/80  Weight: 159 lb (72.1 kg)  Height: 4' 11.5" (1.511 m)   Body mass index is 31.58 kg/m.  General appearance:  Normal affect, orientation and appearance. Skin: Grossly normal HEENT: Without gross lesions.  No cervical or supraclavicular adenopathy. Thyroid normal.  Lungs:  Clear without wheezing, rales or rhonchi Cardiac: RR, without RMG Abdominal:  Soft, nontender, without masses, guarding, rebound, organomegaly or hernia Breasts:  Examined lying and sitting without masses, retractions, discharge or axillary adenopathy. Pelvic:  Ext, BUS, Vagina: Normal with atrophic changes  Cervix: Normal with atrophic changes.  Pap smear/HPV  Uterus: Anteverted, normal size, shape and contour, midline and mobile nontender   Adnexa: Without masses or tenderness    Anus and perineum: Normal   Rectovaginal: Normal sphincter tone without palpated masses or tenderness.    Assessment/Plan:  57 y.o. [id number] female for annual gynecologic exam.   1. Postmenopausal/atrophic changes.  No menopausal symptoms or any vaginal bleeding. 2. Fatigue.  Felt secondary to a low vitamin D by the patient.  No localizing symptoms weight changes hair or  skin changes.  She admits to no exercise.  I stressed the importance of regular exercise both from a feeling better standpoint as well as a cardiovascular standpoint.  We will check baseline labs to include CBC, comprehensive metabolic panel, thyroid panel and vitamin D level.  If no identifiable cause and symptoms continue then recommend follow-up with primary physician for further evaluation. 3. Osteopenia.  DEXA 2016 T score -1.6.  Recommend follow-up DEXA now when she is going to schedule in follow-up for this.  Check vitamin D level today. 4. Mammography coming due now when she is going to schedule in follow-up for this.  Breast exam normal today. 5. Pap smear/HPV 10/2013.  Pap smear/HPV today.  No history of significant abnormal Pap smears.  Plan repeat Pap smear/HPV at 5-year intervals per current screening guidelines. 6. Colonoscopy 2016.  Repeat at their recommended interval. 7. Health maintenance.  Patient is going to return for fasting blood work as ordered above.  We will also check a fasting lipid profile at her request.  Follow-up for lab test results and bone density.  Follow-up in 1 year for annual exam.   [name] MD, 5:00 PM 04/10/2018

## 2018-04-11 DIAGNOSIS — Z124 Encounter for screening for malignant neoplasm of cervix: Secondary | ICD-10-CM | POA: Diagnosis not present

## 2018-04-11 DIAGNOSIS — Z1151 Encounter for screening for human papillomavirus (HPV): Secondary | ICD-10-CM | POA: Diagnosis not present

## 2018-04-12 ENCOUNTER — Other Ambulatory Visit: Payer: BLUE CROSS/BLUE SHIELD

## 2018-04-12 DIAGNOSIS — E559 Vitamin D deficiency, unspecified: Secondary | ICD-10-CM

## 2018-04-12 DIAGNOSIS — R5382 Chronic fatigue, unspecified: Secondary | ICD-10-CM | POA: Diagnosis not present

## 2018-04-12 DIAGNOSIS — Z13220 Encounter for screening for lipoid disorders: Secondary | ICD-10-CM | POA: Diagnosis not present

## 2018-04-12 DIAGNOSIS — M8580 Other specified disorders of bone density and structure, unspecified site: Secondary | ICD-10-CM | POA: Diagnosis not present

## 2018-04-12 LAB — PAP IG AND HPV HIGH-RISK: HPV DNA HIGH RISK: NOT DETECTED

## 2018-04-13 LAB — COMPREHENSIVE METABOLIC PANEL
AG Ratio: 1.5 (calc) (ref 1.0–2.5)
ALT: 15 U/L (ref 6–29)
AST: 17 U/L (ref 10–35)
Albumin: 3.8 g/dL (ref 3.6–5.1)
Alkaline phosphatase (APISO): 77 U/L (ref 33–130)
BUN: 12 mg/dL (ref 7–25)
CO2: 28 mmol/L (ref 20–32)
CREATININE: 0.7 mg/dL (ref 0.50–1.05)
Calcium: 9.2 mg/dL (ref 8.6–10.4)
Chloride: 104 mmol/L (ref 98–110)
GLUCOSE: 108 mg/dL — AB (ref 65–99)
Globulin: 2.5 g/dL (calc) (ref 1.9–3.7)
Potassium: 5 mmol/L (ref 3.5–5.3)
SODIUM: 141 mmol/L (ref 135–146)
TOTAL PROTEIN: 6.3 g/dL (ref 6.1–8.1)
Total Bilirubin: 0.3 mg/dL (ref 0.2–1.2)

## 2018-04-13 LAB — LIPID PANEL
CHOLESTEROL: 223 mg/dL — AB (ref <200)
HDL: 71 mg/dL (ref >50)
LDL CHOLESTEROL (CALC): 128 mg/dL — AB
Non-HDL Cholesterol (Calc): 152 mg/dL (calc) — ABNORMAL HIGH (ref <130)
Total CHOL/HDL Ratio: 3.1 (calc) (ref <5.0)
Triglycerides: 128 mg/dL (ref <150)

## 2018-04-13 LAB — CBC WITH DIFFERENTIAL/PLATELET
BASOS PCT: 0.6 %
Basophils Absolute: 58 cells/uL (ref 0–200)
EOS PCT: 2.8 %
Eosinophils Absolute: 272 cells/uL (ref 15–500)
HEMATOCRIT: 40.9 % (ref 35.0–45.0)
Hemoglobin: 13.7 g/dL (ref 11.7–15.5)
LYMPHS ABS: 2561 {cells}/uL (ref 850–3900)
MCH: 28.8 pg (ref 27.0–33.0)
MCHC: 33.5 g/dL (ref 32.0–36.0)
MCV: 85.9 fL (ref 80.0–100.0)
MPV: 9.6 fL (ref 7.5–12.5)
Monocytes Relative: 6.3 %
NEUTROS PCT: 63.9 %
Neutro Abs: 6198 cells/uL (ref 1500–7800)
PLATELETS: 331 10*3/uL (ref 140–400)
RBC: 4.76 10*6/uL (ref 3.80–5.10)
RDW: 12.8 % (ref 11.0–15.0)
TOTAL LYMPHOCYTE: 26.4 %
WBC mixed population: 611 cells/uL (ref 200–950)
WBC: 9.7 10*3/uL (ref 3.8–10.8)

## 2018-04-13 LAB — THYROID PANEL WITH TSH
FREE THYROXINE INDEX: 2.6 (ref 1.4–3.8)
T3 Uptake: 27 % (ref 22–35)
T4, Total: 9.8 ug/dL (ref 5.1–11.9)
TSH: 0.66 mIU/L (ref 0.40–4.50)

## 2018-04-13 LAB — VITAMIN D 25 HYDROXY (VIT D DEFICIENCY, FRACTURES): Vit D, 25-Hydroxy: 21 ng/mL — ABNORMAL LOW (ref 30–100)

## 2018-04-16 ENCOUNTER — Other Ambulatory Visit: Payer: Self-pay | Admitting: Gynecology

## 2018-04-16 DIAGNOSIS — E559 Vitamin D deficiency, unspecified: Secondary | ICD-10-CM

## 2018-04-16 MED ORDER — VITAMIN D (ERGOCALCIFEROL) 1.25 MG (50000 UNIT) PO CAPS: 50000.0000 [IU] | ORAL_CAPSULE | ORAL | 0 refills | Status: DC

## 2018-04-24 ENCOUNTER — Ambulatory Visit (INDEPENDENT_AMBULATORY_CARE_PROVIDER_SITE_OTHER): Payer: BLUE CROSS/BLUE SHIELD

## 2018-04-24 ENCOUNTER — Other Ambulatory Visit: Payer: Self-pay | Admitting: Gynecology

## 2018-04-24 ENCOUNTER — Encounter: Payer: Self-pay | Admitting: Gynecology

## 2018-04-24 DIAGNOSIS — M8588 Other specified disorders of bone density and structure, other site: Secondary | ICD-10-CM

## 2018-04-24 DIAGNOSIS — M8580 Other specified disorders of bone density and structure, unspecified site: Secondary | ICD-10-CM

## 2018-04-24 DIAGNOSIS — Z13820 Encounter for screening for osteoporosis: Secondary | ICD-10-CM

## 2018-04-24 DIAGNOSIS — M8589 Other specified disorders of bone density and structure, multiple sites: Secondary | ICD-10-CM

## 2018-04-24 DIAGNOSIS — M85851 Other specified disorders of bone density and structure, right thigh: Secondary | ICD-10-CM

## 2018-04-24 DIAGNOSIS — Z1231 Encounter for screening mammogram for malignant neoplasm of breast: Secondary | ICD-10-CM

## 2018-04-25 ENCOUNTER — Other Ambulatory Visit: Payer: Self-pay | Admitting: Gynecology

## 2018-04-25 DIAGNOSIS — Z13820 Encounter for screening for osteoporosis: Secondary | ICD-10-CM

## 2018-04-25 DIAGNOSIS — M85851 Other specified disorders of bone density and structure, right thigh: Secondary | ICD-10-CM

## 2018-05-18 ENCOUNTER — Ambulatory Visit
Admission: RE | Admit: 2018-05-18 | Discharge: 2018-05-18 | Disposition: A | Discharge status: Home / Self Care | Payer: BLUE CROSS/BLUE SHIELD | Source: Ambulatory Visit | Attending: Gynecology | Admitting: Gynecology

## 2018-05-18 DIAGNOSIS — Z1231 Encounter for screening mammogram for malignant neoplasm of breast: Secondary | ICD-10-CM

## 2018-06-19 ENCOUNTER — Ambulatory Visit (INDEPENDENT_AMBULATORY_CARE_PROVIDER_SITE_OTHER): Payer: BLUE CROSS/BLUE SHIELD

## 2018-06-19 DIAGNOSIS — Z23 Encounter for immunization: Secondary | ICD-10-CM | POA: Diagnosis not present

## 2018-07-04 ENCOUNTER — Telehealth: Payer: Self-pay | Admitting: Internal Medicine

## 2018-07-04 MED ORDER — DOXYCYCLINE HYCLATE 100 MG PO TABS: 100.0000 mg | ORAL_TABLET | Freq: Every day | ORAL | 0 refills | Status: DC

## 2018-07-04 NOTE — Telephone Encounter (Signed)
Ok to send in doxycycline  100 mg  disp 50 no refill   Take one po qd beginning 2 days pre travel  and continue 4 weeks after return.

## 2018-07-04 NOTE — Telephone Encounter (Signed)
Pt aware of rx being sent .  Nothing further needed.

## 2018-07-04 NOTE — Telephone Encounter (Signed)
Pt is going to [place] (07/28/18 to 08/15/18) and is requesting doxycycline hycalte 100 mg tablet malarial pills. Please review.

## 2018-07-04 NOTE — Telephone Encounter (Signed)
Copied from [place] [id number]. Topic: Quick Communication - Rx Refill/Question >> Jul 04, 2018  1:04 PM [name], [name] wrote: Medication: doxycycline hycalte 100mg  tablet malarial pills needed (leaving to [place] 07/28/2018 and back 08/15/2017)  Has the patient contacted their pharmacy? Yes.   (Agent: If no, request that the patient contact the pharmacy for the refill.) (Agent: If yes, when and what did the pharmacy advise?)  Preferred Pharmacy (with phone number or street name): [facility] [address], [place] - [place] [address] [place] [address] Phone: [phone] Fax: [phone]  Agent: Please be advised that RX refills may take up to 3 business days. We ask that you follow-up with your pharmacy.

## 2018-07-04 NOTE — Addendum Note (Signed)
Addended by: Virl Cagey on: 07/04/2018 04:59 PM   Modules accepted: Orders

## 2018-07-04 NOTE — Telephone Encounter (Signed)
Please advise Dr Panosh, thanks.   

## 2018-07-10 ENCOUNTER — Other Ambulatory Visit: Payer: Self-pay | Admitting: Gynecology
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
## 2018-08-24 ENCOUNTER — Encounter: Payer: Self-pay | Admitting: Internal Medicine

## 2018-08-24 ENCOUNTER — Ambulatory Visit: Payer: BLUE CROSS/BLUE SHIELD | Admitting: Internal Medicine

## 2018-08-24 VITALS — BP 142/86 | HR 85 | Temp 97.4°F | Wt 158.5 lb

## 2018-08-24 DIAGNOSIS — R739 Hyperglycemia, unspecified: Secondary | ICD-10-CM

## 2018-08-24 DIAGNOSIS — E559 Vitamin D deficiency, unspecified: Secondary | ICD-10-CM | POA: Diagnosis not present

## 2018-08-24 DIAGNOSIS — R030 Elevated blood-pressure reading, without diagnosis of hypertension: Secondary | ICD-10-CM

## 2018-08-24 DIAGNOSIS — I10 Essential (primary) hypertension: Secondary | ICD-10-CM

## 2018-08-24 DIAGNOSIS — Z79899 Other long term (current) drug therapy: Secondary | ICD-10-CM

## 2018-08-24 LAB — BASIC METABOLIC PANEL
BUN: 10 mg/dL (ref 6–23)
CHLORIDE: 104 meq/L (ref 96–112)
CO2: 30 meq/L (ref 19–32)
Calcium: 9.4 mg/dL (ref 8.4–10.5)
Creatinine, Ser: 0.62 mg/dL (ref 0.40–1.20)
GFR: 98.24 mL/min (ref >60.00)
GLUCOSE: 107 mg/dL — AB (ref 70–99)
POTASSIUM: 4.9 meq/L (ref 3.5–5.1)
Sodium: 143 mEq/L (ref 135–145)

## 2018-08-24 LAB — HEMOGLOBIN A1C: Hgb A1c MFr Bld: 6.3 % (ref 4.6–6.5)

## 2018-08-24 LAB — VITAMIN D 25 HYDROXY (VIT D DEFICIENCY, FRACTURES): VITD: 36.36 ng/mL (ref 30.00–100.00)

## 2018-08-24 NOTE — Patient Instructions (Signed)
Will notify you  of labs when available.  And plan for vit d  But usually we add OTC 1000 iu units of VIt d per day.    Make sure bp  Is  At goal 130/80 and below  We can increase the losartan to 50 mg per day if needed .   Bp best 120/80

## 2018-08-31 NOTE — Telephone Encounter (Signed)
I would have you take  4000 iu per dayD3    Or less   OTC .

## 2018-09-06 DIAGNOSIS — I10 Essential (primary) hypertension: Secondary | ICD-10-CM

## 2018-09-11 MED ORDER — LOSARTAN POTASSIUM 50 MG PO TABS: 50.0000 mg | ORAL_TABLET | Freq: Every day | ORAL | 0 refills | Status: DC

## 2018-09-11 NOTE — Telephone Encounter (Signed)
Readings not quite at goal  I suggest  To take 50 mg losartan per day    Either take 2 of the 25 mg per day    Please send in  50 mg losartan once a day dips 90  Then send in readings after a month or higher dose

## 2018-10-02 ENCOUNTER — Telehealth: Payer: Self-pay

## 2018-10-02 DIAGNOSIS — M79671 Pain in right foot: Secondary | ICD-10-CM

## 2018-10-02 DIAGNOSIS — M79672 Pain in left foot: Principal | ICD-10-CM

## 2018-10-02 NOTE — Telephone Encounter (Signed)
Not sure podiatry will help  But ok to refer .

## 2018-10-02 NOTE — Telephone Encounter (Signed)
Copied from [place] [id number]. Topic: Referral - Request for Referral >> Oct 02, 2018  8:20 AM Alanda Slim E wrote: Has patient seen PCP for this complaint? No  *If NO, is insurance requiring patient see PCP for this issue before PCP can refer them? Referral for which specialty: podiatry  Preferred provider/office:  Reason for referral: foot pain in both feet (on going especially after sleep)

## 2018-10-09 ENCOUNTER — Ambulatory Visit: Payer: BLUE CROSS/BLUE SHIELD | Admitting: Podiatry

## 2018-10-09 ENCOUNTER — Ambulatory Visit (INDEPENDENT_AMBULATORY_CARE_PROVIDER_SITE_OTHER): Payer: BLUE CROSS/BLUE SHIELD

## 2018-10-09 VITALS — BP 139/83 | HR 89

## 2018-10-09 DIAGNOSIS — M79671 Pain in right foot: Secondary | ICD-10-CM

## 2018-10-09 DIAGNOSIS — M79672 Pain in left foot: Secondary | ICD-10-CM

## 2018-10-09 DIAGNOSIS — M722 Plantar fascial fibromatosis: Secondary | ICD-10-CM

## 2018-10-09 DIAGNOSIS — M7730 Calcaneal spur, unspecified foot: Secondary | ICD-10-CM | POA: Diagnosis not present

## 2018-10-09 MED ORDER — MELOXICAM 7.5 MG PO TABS: 7.5000 mg | ORAL_TABLET | Freq: Every day | ORAL | 0 refills | Status: DC

## 2018-10-09 NOTE — Progress Notes (Signed)
Dg  

## 2018-10-09 NOTE — Patient Instructions (Signed)

## 2018-10-10 ENCOUNTER — Other Ambulatory Visit: Payer: Self-pay | Admitting: Podiatry

## 2018-10-10 DIAGNOSIS — M722 Plantar fascial fibromatosis: Secondary | ICD-10-CM
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
## 2018-10-29 NOTE — Telephone Encounter (Signed)
It looks like the afternoon BPs are good but still high in am .  Couple of options   See if she can do a web x visit for tomorrow .Marland KitchenMarland Kitchen
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
## 2018-10-30 ENCOUNTER — Encounter: Payer: Self-pay | Admitting: Internal Medicine

## 2018-10-30 ENCOUNTER — Other Ambulatory Visit: Payer: Self-pay

## 2018-10-30 ENCOUNTER — Ambulatory Visit (INDEPENDENT_AMBULATORY_CARE_PROVIDER_SITE_OTHER): Payer: BLUE CROSS/BLUE SHIELD | Admitting: Internal Medicine

## 2018-10-30 DIAGNOSIS — Z79899 Other long term (current) drug therapy: Secondary | ICD-10-CM | POA: Diagnosis not present

## 2018-10-30 DIAGNOSIS — I10 Essential (primary) hypertension: Secondary | ICD-10-CM

## 2018-10-30 MED ORDER — LOSARTAN POTASSIUM 50 MG PO TABS: 50.0000 mg | ORAL_TABLET | Freq: Every day | ORAL | 1 refills | Status: DC

## 2018-10-30 MED ORDER — HYDROCHLOROTHIAZIDE 12.5 MG PO CAPS: 12.5000 mg | ORAL_CAPSULE | Freq: Every day | ORAL | 1 refills | Status: DC

## 2019-01-08 ENCOUNTER — Other Ambulatory Visit: Payer: Self-pay

## 2019-01-08 ENCOUNTER — Ambulatory Visit (INDEPENDENT_AMBULATORY_CARE_PROVIDER_SITE_OTHER): Payer: BC Managed Care – PPO | Admitting: Internal Medicine

## 2019-01-08 ENCOUNTER — Encounter: Payer: Self-pay | Admitting: Internal Medicine

## 2019-01-08 DIAGNOSIS — R198 Other specified symptoms and signs involving the digestive system and abdomen: Secondary | ICD-10-CM

## 2019-01-08 DIAGNOSIS — I10 Essential (primary) hypertension: Secondary | ICD-10-CM | POA: Diagnosis not present

## 2019-01-08 DIAGNOSIS — Z79899 Other long term (current) drug therapy: Secondary | ICD-10-CM
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
## 2019-01-14 NOTE — Telephone Encounter (Signed)
Glad you are doing better. Stay on the pepcid for another  3-4 weeks and then get back with me . And get me your Blood pressure readings at that time

## 2019-02-04 ENCOUNTER — Other Ambulatory Visit: Payer: Self-pay

## 2019-02-04 ENCOUNTER — Encounter: Payer: Self-pay | Admitting: Internal Medicine

## 2019-02-04 ENCOUNTER — Ambulatory Visit (INDEPENDENT_AMBULATORY_CARE_PROVIDER_SITE_OTHER): Payer: BC Managed Care – PPO | Admitting: Internal Medicine

## 2019-02-04 DIAGNOSIS — I10 Essential (primary) hypertension: Secondary | ICD-10-CM | POA: Diagnosis not present

## 2019-02-04 DIAGNOSIS — R6889 Other general symptoms and signs: Secondary | ICD-10-CM

## 2019-02-04 DIAGNOSIS — Z79899 Other long term (current) drug therapy: Secondary | ICD-10-CM | POA: Diagnosis not present

## 2019-02-04 DIAGNOSIS — R198 Other specified symptoms and signs involving the digestive system and abdomen: Secondary | ICD-10-CM | POA: Diagnosis not present

## 2019-02-04 DIAGNOSIS — R030 Elevated blood-pressure reading, without diagnosis of hypertension: Secondary | ICD-10-CM

## 2019-02-04 NOTE — Telephone Encounter (Signed)
See  Virtual bisit
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
## 2019-02-07 NOTE — Telephone Encounter (Signed)
[name]  Please arrange / Order covid 19 testing and dx sore throat.

## 2019-02-08 ENCOUNTER — Telehealth: Payer: Self-pay | Admitting: *Deleted

## 2019-02-08 ENCOUNTER — Other Ambulatory Visit: Payer: BC Managed Care – PPO

## 2019-02-08 DIAGNOSIS — R6889 Other general symptoms and signs: Secondary | ICD-10-CM | POA: Diagnosis not present

## 2019-02-08 DIAGNOSIS — Z20822 Contact with and (suspected) exposure to covid-19: Secondary | ICD-10-CM

## 2019-02-08 NOTE — Telephone Encounter (Signed)
Contacted pt to schedule testing; the says that she has reflux, and a sore mouth, not a sore throat; the is no availability at [facility] site 02/08/2019; the pt would like to be tested today; she accepts appointment [facility] site 02/08/2019 at 1545; pt given address, location, and instructions that she and all  occupants of her vehicle should wear masks; pt also notified that results require 5-7 business days for completion, and will be available for review in MyChart; she verbalized understanding; orders placed per protocol.

## 2019-02-14 LAB — NOVEL CORONAVIRUS, NAA: SARS-CoV-2, NAA: NOT DETECTED
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
## 2019-02-15 NOTE — Telephone Encounter (Signed)
By protocol  You should not go back to work until testing results are back   Apologies that I didn't see t his message until today   About when you wanted to go back  To work  Let me know when results are in so we can get you a letter . WP

## 2019-02-18 ENCOUNTER — Ambulatory Visit: Payer: BC Managed Care – PPO | Admitting: Internal Medicine

## 2019-02-18 ENCOUNTER — Other Ambulatory Visit: Payer: Self-pay

## 2019-02-18 ENCOUNTER — Encounter: Payer: Self-pay | Admitting: Internal Medicine

## 2019-02-18 VITALS — BP 130/80 | HR 92 | Temp 98.5°F | Ht 58.5 in | Wt 148.8 lb

## 2019-02-18 DIAGNOSIS — R6889 Other general symptoms and signs: Secondary | ICD-10-CM

## 2019-02-18 DIAGNOSIS — R739 Hyperglycemia, unspecified: Secondary | ICD-10-CM | POA: Diagnosis not present

## 2019-02-18 DIAGNOSIS — E7800 Pure hypercholesterolemia, unspecified: Secondary | ICD-10-CM

## 2019-02-18 DIAGNOSIS — Z79899 Other long term (current) drug therapy: Secondary | ICD-10-CM | POA: Diagnosis not present

## 2019-02-18 DIAGNOSIS — I10 Essential (primary) hypertension: Secondary | ICD-10-CM

## 2019-02-18 LAB — CBC WITH DIFFERENTIAL/PLATELET
Basophils Absolute: 0.1 10*3/uL (ref 0.0–0.1)
Basophils Relative: 0.6 % (ref 0.0–3.0)
Eosinophils Absolute: 0.2 10*3/uL (ref 0.0–0.7)
Eosinophils Relative: 2.3 % (ref 0.0–5.0)
HCT: 43.6 % (ref 36.0–46.0)
Hemoglobin: 14.1 g/dL (ref 12.0–15.0)
Lymphocytes Relative: 29 % (ref 12.0–46.0)
Lymphs Abs: 2.9 10*3/uL (ref 0.7–4.0)
MCHC: 32.3 g/dL (ref 30.0–36.0)
MCV: 88 fl (ref 78.0–100.0)
Monocytes Absolute: 0.8 10*3/uL (ref 0.1–1.0)
Monocytes Relative: 7.6 % (ref 3.0–12.0)
Neutro Abs: 6.1 10*3/uL (ref 1.4–7.7)
Neutrophils Relative %: 60.5 % (ref 43.0–77.0)
Platelets: 340 10*3/uL (ref 150.0–400.0)
RBC: 4.95 Mil/uL (ref 3.87–5.11)
RDW: 13.5 % (ref 11.5–15.5)
WBC: 10.1 10*3/uL (ref 4.0–10.5)

## 2019-02-18 LAB — HEMOGLOBIN A1C: Hgb A1c MFr Bld: 6.4 % (ref 4.6–6.5)

## 2019-02-18 MED ORDER — FLUOCINONIDE-E 0.05 % EX CREA: 1.0000 "application " | TOPICAL_CREAM | Freq: Two times a day (BID) | CUTANEOUS | 0 refills | Status: DC

## 2019-02-18 NOTE — Patient Instructions (Addendum)
Glad y ou are doing well now   Lab  Today  And if BG is  Too high  may needs fu  Continue lifestyle intervention healthy eating and exercise .  And healthy weight loss   bp seems controlled  Stay on pepcid at night for another month and then can try as needed.   Wt Readings from Last 3 Encounters:  02/18/19 148 lb 12.8 oz (67.5 kg)  08/24/18 158 lb 8 oz (71.9 kg)  04/10/18 159 lb (72.1 kg)    Plan Fu  dpending on   Now doing and labs  Or 4-6 months

## 2019-02-19 LAB — BASIC METABOLIC PANEL
BUN: 12 mg/dL (ref 6–23)
CO2: 29 mEq/L (ref 19–32)
Calcium: 8.8 mg/dL (ref 8.4–10.5)
Chloride: 101 mEq/L (ref 96–112)
Creatinine, Ser: 0.62 mg/dL (ref 0.40–1.20)
GFR: 98.08 mL/min (ref >60.00)
Glucose, Bld: 84 mg/dL (ref 70–99)
Potassium: 4.4 mEq/L (ref 3.5–5.1)
Sodium: 140 mEq/L (ref 135–145)

## 2019-02-19 LAB — LIPID PANEL
Cholesterol: 227 mg/dL — ABNORMAL HIGH (ref 0–200)
HDL: 67.3 mg/dL (ref >39.00)
LDL Cholesterol: 127 mg/dL — ABNORMAL HIGH (ref 0–99)
NonHDL: 159.75
Total CHOL/HDL Ratio: 3
Triglycerides: 165 mg/dL — ABNORMAL HIGH (ref 0.0–149.0)
VLDL: 33 mg/dL (ref 0.0–40.0)

## 2019-02-19 LAB — HEPATIC FUNCTION PANEL
ALT: 13 U/L (ref 0–35)
AST: 18 U/L (ref 0–37)
Albumin: 4.2 g/dL (ref 3.5–5.2)
Alkaline Phosphatase: 76 U/L (ref 39–117)
Bilirubin, Direct: 0.1 mg/dL (ref 0.0–0.3)
Total Bilirubin: 0.4 mg/dL (ref 0.2–1.2)
Total Protein: 6.7 g/dL (ref 6.0–8.3)

## 2019-02-19 LAB — TSH: TSH: 1.82 u[IU]/mL (ref 0.35–4.50)

## 2019-03-05 DIAGNOSIS — Z20828 Contact with and (suspected) exposure to other viral communicable diseases: Secondary | ICD-10-CM | POA: Diagnosis not present

## 2019-03-05 DIAGNOSIS — Z7189 Other specified counseling: Secondary | ICD-10-CM | POA: Diagnosis not present

## 2019-04-04 ENCOUNTER — Telehealth (INDEPENDENT_AMBULATORY_CARE_PROVIDER_SITE_OTHER): Payer: BC Managed Care – PPO | Admitting: Internal Medicine

## 2019-04-04 ENCOUNTER — Encounter: Payer: Self-pay | Admitting: Internal Medicine

## 2019-04-04 ENCOUNTER — Other Ambulatory Visit: Payer: Self-pay

## 2019-04-04 DIAGNOSIS — R509 Fever, unspecified: Secondary | ICD-10-CM | POA: Diagnosis not present

## 2019-04-04 DIAGNOSIS — R110 Nausea: Secondary | ICD-10-CM

## 2019-04-04 MED ORDER — ONDANSETRON 4 MG PO TBDP: 4.0000 mg | ORAL_TABLET | Freq: Three times a day (TID) | ORAL | 0 refills | Status: DC | PRN
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
## 2019-04-05 ENCOUNTER — Other Ambulatory Visit: Payer: Self-pay

## 2019-04-05 DIAGNOSIS — R6889 Other general symptoms and signs: Secondary | ICD-10-CM | POA: Diagnosis not present

## 2019-04-05 DIAGNOSIS — Z20822 Contact with and (suspected) exposure to covid-19: Secondary | ICD-10-CM

## 2019-04-07 LAB — NOVEL CORONAVIRUS, NAA: SARS-CoV-2, NAA: NOT DETECTED

## 2019-04-09 ENCOUNTER — Encounter: Payer: Self-pay | Admitting: Internal Medicine

## 2019-04-09 ENCOUNTER — Telehealth (INDEPENDENT_AMBULATORY_CARE_PROVIDER_SITE_OTHER): Payer: BC Managed Care – PPO | Admitting: Internal Medicine

## 2019-04-09 ENCOUNTER — Other Ambulatory Visit: Payer: Self-pay

## 2019-04-09 ENCOUNTER — Telehealth: Payer: Self-pay

## 2019-04-09 DIAGNOSIS — R110 Nausea: Secondary | ICD-10-CM

## 2019-04-09 DIAGNOSIS — R195 Other fecal abnormalities: Secondary | ICD-10-CM

## 2019-04-09 NOTE — Telephone Encounter (Signed)
Copied from [place] [id number]. Topic: General - Inquiry >> Apr 09, 2019  8:04 AM Virl Axe D wrote: Reason for CRM: Pt stated she is still having loose stools, some nausea and is only able to eat apples, bananas. She would like to know if Dr. Regis Bill would send in a antibiotic to her pharmacy. CB# [phone]
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
## 2019-04-09 NOTE — Telephone Encounter (Signed)
Pt is set up for virtual visit today

## 2019-04-25 DIAGNOSIS — Z23 Encounter for immunization: Secondary | ICD-10-CM | POA: Diagnosis not present

## 2019-04-30 ENCOUNTER — Encounter: Payer: Self-pay | Admitting: Gynecology

## 2019-05-07 DIAGNOSIS — Z20828 Contact with and (suspected) exposure to other viral communicable diseases: Secondary | ICD-10-CM | POA: Diagnosis not present

## 2019-06-06 DIAGNOSIS — Z03818 Encounter for observation for suspected exposure to other biological agents ruled out: Secondary | ICD-10-CM | POA: Diagnosis not present

## 2019-06-19 ENCOUNTER — Other Ambulatory Visit: Payer: Self-pay | Admitting: Internal Medicine

## 2019-06-19 DIAGNOSIS — Z1231 Encounter for screening mammogram for malignant neoplasm of breast: Secondary | ICD-10-CM

## 2019-07-01 ENCOUNTER — Other Ambulatory Visit: Payer: Self-pay | Admitting: Internal Medicine

## 2019-07-18 ENCOUNTER — Other Ambulatory Visit: Payer: Self-pay

## 2019-07-18 ENCOUNTER — Telehealth: Payer: Self-pay

## 2019-07-18 NOTE — Telephone Encounter (Signed)
Pt called in and stated that she is having sharp leg pain. Pt claimed that the pain radiates from hip to foot. Pt stated the pain is not unbearable and she can wait until Dr. Regis Bill comes in. Dr. Regis Bill did  Not have any in person appt. Via panosh CMA and schedule. Pt has been scheduled with Dr.Jordan.

## 2019-07-19 ENCOUNTER — Ambulatory Visit: Payer: BC Managed Care – PPO | Admitting: Family Medicine

## 2019-07-19 ENCOUNTER — Encounter: Payer: Self-pay | Admitting: Family Medicine

## 2019-07-19 VITALS — BP 130/80 | HR 99 | Temp 96.3°F | Resp 16 | Ht 58.5 in | Wt 149.0 lb

## 2019-07-19 DIAGNOSIS — M629 Disorder of muscle, unspecified: Secondary | ICD-10-CM

## 2019-07-19 DIAGNOSIS — M79652 Pain in left thigh: Secondary | ICD-10-CM

## 2019-07-19 DIAGNOSIS — M79651 Pain in right thigh: Secondary | ICD-10-CM | POA: Diagnosis not present
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
## 2019-07-19 NOTE — Patient Instructions (Addendum)
A few things to remember from today's visit:   Hamstring tightness of both lower extremities - Plan: Ambulatory referral to Physical Therapy  Bilateral thigh pain - Plan: Ambulatory referral to Physical Therapy   Hamstring Strain Rehab Ask your health care provider which exercises are safe for you. Do exercises exactly as told by your health care provider and adjust them as directed. It is normal to feel mild stretching, pulling, tightness, or discomfort as you do these exercises. Stop right away if you feel sudden pain or your pain gets worse. Do not begin these exercises until told by your health care provider. Stretching and range-of-motion exercises These exercises warm up your muscles and joints and improve the movement and flexibility of your thighs. These exercises also help to relieve pain, numbness, and tingling. Talk to your health care provider about these restrictions. Knee extension, seated  1. Sit with your left / right heel propped on a chair, a coffee table, or a footstool. Do not have anything under your knee to support it. 2. Allow your leg muscles to relax, letting gravity straighten out your knee (extension). You should feel a stretch behind your left / right knee. 3. If told by your health care provider, deepen the stretch by placing a _____0_____ weight on your thigh, just above your kneecap. 4. Hold this position for _____10_____ seconds. Repeat ___2______ times. Complete this exercise _____2_____ times a day. Seated stretch This exercise is sometimes called hamstrings and adductors stretch. 1. Sit on the floor with your legs stretched wide. Keep your knees straight during this exercise. 2. Keeping your head and back in a straight line, bend at your waist to reach for your left foot (position A). You should feel a stretch in your right inner thigh (adductors). 3. Hold this position for _______10___ seconds. Then slowly return to the upright position. 4. Keeping your  head and back in a straight line, bend at your waist to reach forward (position B). You should feel a stretch behind both of your thighs or knees (hamstrings). 5. Hold this position for _____10_____ seconds. Then slowly return to the upright position. 6. Keeping your head and back in a straight line, bend at your waist to reach for your right foot (position C). You should feel a stretch in your left inner thigh (adductors). 7. Hold this position for ______10____ seconds. Then slowly return to the upright position. Repeat __________ times. Complete this exercise ____10______ times a day. Hamstrings stretch, supine  1. Lie on your back (supine position). 2. Loop a belt or towel over the ball of your left / right foot. The ball of your foot is on the walking surface, right under your toes. 3. Straighten your left / right knee and slowly pull on the belt or towel to raise your leg. ? Do not let your left / right knee bend while you do this. ? Keep your other leg flat on the floor. ? Raise the left / right leg until you feel a gentle stretch behind your left / right knee or thigh (hamstrings). 4. Hold this position for __________ seconds. 5. Slowly return your leg to the starting position. Repeat _______2___ times. Complete this exercise ___2_______ times a day. Strengthening exercises These exercises build strength and endurance in your thighs. Endurance is the ability to use your muscles for a long time, even after they get tired. Straight leg raises, prone This exercise strengthens the muscles that move the hips (hip extensors). 1. Lie on your abdomen  on a firm surface (prone position). 2. Tense the muscles in your buttocks and lift your left / right leg about 4 inches (10 cm). Keep your knee straight as you lift your leg. If you cannot lift your leg that high without arching your back, place a pillow under your hips. 3. Hold the position for _______10___ seconds. 4. Slowly lower your leg to the  starting position. 5. Allow your muscles to relax completely before you start the next repetition. Repeat __________ times. Complete this exercise ____2______ times a day. Bridge This exercise strengthens the muscles in your buttocks and the back of your thighs (hip extensors). 1. Lie on your back on a firm surface with your knees bent and your feet flat on the floor. 2. Tighten your buttocks muscles and lift your bottom off the floor until the trunk of your body is level with your thighs. ? You should feel the muscles working in your buttocks and the back of your thighs. ? Do not arch your back. 3. Hold this position for __________ seconds. 4. Slowly lower your hips to the starting position. 5. Let your buttocks muscles relax completely between repetitions. 6. If told by your health care provider, keep your bottom lifted off the floor while you slowly walk your feet away from you as far as you can control. Hold for ___10_______ seconds, then slowly walk your feet back toward you. Repeat _______2___ times. Complete this exercise ____2 This is an exercise in which you walk sideways (lateral), with tension provided by an exercise band. The exercise strengthens the muscles in your hip (hip abductors). 1. Stand in a long hallway. 2. Wrap a loop of exercise band around your legs, just above your knees. 3. Bend your knees gently and drop your hips down and back so your weight is over your heels. 4. Step to the side to move down the length of the hallway, keeping your toes pointed ahead of you and keeping tension in the band. 5. Repeat, leading with your other leg. Repeat ____2______ times. Complete this exercise ____2______ times a day. Single leg stand with reaching This exercise is also called eccentric hamstring stretch. 1. Stand on your left / right foot. Keep your big toe down on the floor and try to keep your arch lifted. 2. Slowly reach down toward the floor as far as you can while keeping  your balance. Lowering your thigh under tension is called eccentric stretching. 3. Hold this position for _______10___ seconds. Repeat __________ times. Complete this exercise _____2_____ times a day. Plank, prone This exercise strengthens muscles in your abdomen and core area. 1. Lie on your abdomen on the floor (prone position),and prop yourself up on your elbows. Your hands should be straight out in front of you, and your elbows should be below your shoulders. Position your feet similar to a push-up position so your toes are on the ground. 2. Tighten your abdominal muscles and lift your body off the floor. ? Do not arch your back. ? Do not hold your breath. 3. Hold this position for __________ seconds. Repeat __________ times. Complete this exercise __________ times a day. This information is not intended to replace advice given to you by your health care provider. Make sure you discuss any questions you have with your health care provider. Document Released: 07/18/2005 Document Revised: 11/08/2018 Document Reviewed: 07/16/2018 Elsevier Patient Education  [place].  Please be sure medication list is accurate. If a new problem present, please set up appointment  sooner than planned today.

## 2019-08-08 ENCOUNTER — Other Ambulatory Visit: Payer: Self-pay

## 2019-08-08 ENCOUNTER — Ambulatory Visit
Admission: RE | Admit: 2019-08-08 | Discharge: 2019-08-08 | Disposition: A | Discharge status: Home / Self Care | Payer: BC Managed Care – PPO | Source: Ambulatory Visit | Attending: Internal Medicine | Admitting: Internal Medicine

## 2019-08-08 DIAGNOSIS — Z1231 Encounter for screening mammogram for malignant neoplasm of breast: Secondary | ICD-10-CM | POA: Diagnosis not present

## 2019-08-09 ENCOUNTER — Ambulatory Visit: Payer: BC Managed Care – PPO | Admitting: Physical Therapy

## 2019-08-27 ENCOUNTER — Encounter: Payer: Self-pay | Admitting: Physical Therapy

## 2019-08-27 ENCOUNTER — Ambulatory Visit: Payer: BC Managed Care – PPO | Attending: Family Medicine | Admitting: Physical Therapy

## 2019-08-27 ENCOUNTER — Other Ambulatory Visit: Payer: Self-pay

## 2019-08-27 DIAGNOSIS — M79652 Pain in left thigh: Secondary | ICD-10-CM | POA: Diagnosis not present

## 2019-08-27 DIAGNOSIS — M6281 Muscle weakness (generalized): Secondary | ICD-10-CM | POA: Diagnosis not present

## 2019-08-27 DIAGNOSIS — M545 Low back pain, unspecified: Secondary | ICD-10-CM
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
## 2019-09-03 ENCOUNTER — Ambulatory Visit: Payer: BC Managed Care – PPO | Admitting: Physical Therapy

## 2019-09-10 DIAGNOSIS — Z03818 Encounter for observation for suspected exposure to other biological agents ruled out: Secondary | ICD-10-CM | POA: Diagnosis not present

## 2019-09-11 ENCOUNTER — Encounter: Payer: Self-pay | Admitting: Physical Therapy

## 2019-09-11 ENCOUNTER — Ambulatory Visit: Payer: BC Managed Care – PPO | Attending: Family Medicine | Admitting: Physical Therapy

## 2019-09-11 ENCOUNTER — Other Ambulatory Visit: Payer: Self-pay

## 2019-09-11 DIAGNOSIS — M79652 Pain in left thigh: Secondary | ICD-10-CM | POA: Insufficient documentation

## 2019-09-11 DIAGNOSIS — M6281 Muscle weakness (generalized): Secondary | ICD-10-CM | POA: Diagnosis not present

## 2019-09-11 DIAGNOSIS — M545 Low back pain, unspecified: Secondary | ICD-10-CM

## 2019-09-11 NOTE — Patient Instructions (Signed)
Access Code: [facility]  URL: [email or web address]  Date: 09/11/2019  Prepared by: Sherol Dade   Exercises  Seated Piriformis Stretch - 10 reps - 5 seconds hold - 2x daily - 7x weekly  Supine Sciatic Nerve Glide - 10 reps - 2x daily - 7x weekly  Bridge - 10 reps - 2-3 sets - 1x daily - 7x weekly  Standing Hip Abduction - 10 reps - 2-3 sets - 1x daily - 7x weekly    [facility] [address] [place], [place] [address] Phone # [phone] Fax [phone]
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
## 2019-09-13 ENCOUNTER — Ambulatory Visit: Payer: BC Managed Care – PPO

## 2019-09-17 ENCOUNTER — Other Ambulatory Visit: Payer: Self-pay

## 2019-09-17 ENCOUNTER — Ambulatory Visit (INDEPENDENT_AMBULATORY_CARE_PROVIDER_SITE_OTHER): Payer: BC Managed Care – PPO | Admitting: Obstetrics and Gynecology

## 2019-09-17 ENCOUNTER — Encounter: Payer: Self-pay | Admitting: Obstetrics and Gynecology

## 2019-09-17 VITALS — BP 120/74 | Ht 59.0 in | Wt 152.0 lb

## 2019-09-17 DIAGNOSIS — Z01419 Encounter for gynecological examination (general) (routine) without abnormal findings: Secondary | ICD-10-CM

## 2019-09-17 DIAGNOSIS — M8580 Other specified disorders of bone density and structure, unspecified site: Secondary | ICD-10-CM

## 2019-09-17 NOTE — Patient Instructions (Signed)
We will plan to repeat the DEXA/bone density scan this year in the fall.  Continue weight bearing exercise, and vitamin D/calcium intake.

## 2019-09-17 NOTE — Progress Notes (Signed)
   [name] 01/12/1961 [id number]  SUBJECTIVE:  59 y.o. [id number] female for annual routine gynecologic exam and Pap smear. She has no gynecologic concerns.  Current Outpatient Medications  Medication Sig Dispense Refill  . Biotin 1 MG CAPS Take 1 capsule by mouth.    . budesonide-formoterol (SYMBICORT) 80-4.5 MCG/ACT inhaler Inhale 2 puffs into the lungs 2 (two) times daily. 1 Inhaler 11  . Calcium Carbonate-Vit D-Min (CALTRATE PLUS PO) Take 1 tablet by mouth daily.     . fluocinonide-emollient (LIDEX-E) 0.05 % cream Apply 1 application topically 2 (two) times daily. To rash  Not on face 30 g 0  . losartan (COZAAR) 50 MG tablet TAKE ONE TABLET BY MOUTH ONE TIME DAILY FOR HYPERTENSION 90 tablet 0  . ondansetron (ZOFRAN-ODT) 4 MG disintegrating tablet Take 1 tablet (4 mg total) by mouth every 8 (eight) hours as needed for nausea or vomiting. 15 tablet 0   No current facility-administered medications for this visit.   Allergies: Aspirin  No LMP recorded. Patient is postmenopausal.  Past medical history,surgical history, problem list, medications, allergies, family history and social history were all reviewed and documented as reviewed in the EPIC chart.  ROS:  Feeling well. No dyspnea or chest pain on exertion.  No abdominal pain, change in bowel habits, black or bloody stools.  No urinary tract symptoms. GYN ROS: normal menses, no abnormal bleeding, pelvic pain or discharge, no breast pain or new or enlarging lumps on self exam. No neurological complaints.  OBJECTIVE:  Ht 4\' 11"  (1.499 m)   Wt 152 lb (68.9 kg)   BMI 30.70 kg/m  The patient appears well, alert, oriented x 3, in no distress. ENT normal.  Neck supple. No cervical or supraclavicular adenopathy or thyromegaly.  Lungs are clear, good air entry, no wheezes, rhonchi or rales. S1 and S2 normal, no murmurs, regular rate and rhythm.  Abdomen soft without tenderness, guarding, mass or organomegaly.  Neurological is normal, no  focal findings.  BREAST EXAM: breasts appear normal, no suspicious masses, no skin or nipple changes or axillary nodes  PELVIC EXAM: VULVA: normal appearing vulva with no masses, tenderness or lesions, VAGINA: normal appearing vagina with normal color and discharge, no lesions, CERVIX: normal appearing cervix without discharge or lesions, UTERUS: uterus is normal size, shape, consistency and nontender, ADNEXA: normal adnexa in size, nontender and no masses, RECTAL: declined Chaperone: Caryn Bee present during the examination  ASSESSMENT:  59 y.o. [id number] here for annual gynecologic exam  PLAN:   1. Postmenopausal. No significant symptoms at this time. 2. Pap smear/HPV 04/2018.  No significant history of abnormal Pap smears.  Next Pap smear due 2024 following the current guidelines recommending the 5 year interval. 3. Mammogram 08/2019.  Normal breast exam today.  She is reminded to schedule an annual mammogram this year when due. 4. Colonoscopy 2016.  Recommended that she follow up at the recommended interval.   5. Osteopenia.  DEXA 04/2018 T score -1.4..  Next DEXA recommended later this year, so she plans to schedule this when due.  Vitamin D deficiency noted in 2019 and supplementing per direction of her primary doctor.  Normal vitamin D level 08/2018, offered to recheck today and she declines. 6. Health maintenance.  No labs today as she normally has these completed with her primary care provider.    Return annually or sooner, prn.  Joseph Pierini MD  09/17/19

## 2019-09-18 ENCOUNTER — Ambulatory Visit: Payer: BC Managed Care – PPO | Admitting: Physical Therapy

## 2019-09-18 ENCOUNTER — Encounter: Payer: Self-pay | Admitting: Physical Therapy

## 2019-09-18 DIAGNOSIS — M6281 Muscle weakness (generalized): Secondary | ICD-10-CM

## 2019-09-18 DIAGNOSIS — M545 Low back pain, unspecified: Secondary | ICD-10-CM

## 2019-09-18 DIAGNOSIS — M79652 Pain in left thigh: Secondary | ICD-10-CM
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
## 2019-09-25 ENCOUNTER — Encounter: Payer: BC Managed Care – PPO | Admitting: Physical Therapy

## 2019-09-26 ENCOUNTER — Ambulatory Visit: Payer: BC Managed Care – PPO | Attending: Internal Medicine

## 2019-09-26 ENCOUNTER — Ambulatory Visit: Payer: BC Managed Care – PPO | Admitting: Physical Therapy

## 2019-09-26 DIAGNOSIS — Z23 Encounter for immunization: Secondary | ICD-10-CM | POA: Insufficient documentation

## 2019-09-26 NOTE — Progress Notes (Signed)
   Covid-19 Vaccination Clinic  Name:  [name]    MRN: [id number] DOB: 03/11/1961  09/26/2019  [name] was observed post Covid-19 immunization for 15 minutes without incidence. She was provided with Vaccine Information Sheet and instruction to access the V-Safe system.   [name] was instructed to call 911 with any severe reactions post vaccine: Marland Kitchen Difficulty breathing  . Swelling of your face and throat  . A fast heartbeat  . A bad rash all over your body  . Dizziness and weakness    Immunizations Administered    Name Date Dose VIS Date Route   Pfizer COVID-19 Vaccine 09/26/2019 10:32 AM 0.3 mL 07/12/2019 Intramuscular   Manufacturer: [place]   Lot: [id number]   [place]: [id number]

## 2019-09-30 ENCOUNTER — Other Ambulatory Visit: Payer: Self-pay | Admitting: Internal Medicine

## 2019-10-01 ENCOUNTER — Encounter: Payer: BC Managed Care – PPO | Admitting: Physical Therapy

## 2019-10-08 ENCOUNTER — Encounter: Payer: BC Managed Care – PPO | Admitting: Physical Therapy

## 2019-10-22 ENCOUNTER — Ambulatory Visit: Payer: BC Managed Care – PPO | Attending: Internal Medicine

## 2019-10-22 DIAGNOSIS — Z23 Encounter for immunization: Secondary | ICD-10-CM

## 2019-10-22 NOTE — Progress Notes (Signed)
   Covid-19 Vaccination Clinic  Name:  [name]    MRN: [id number] DOB: August 31, 1960  10/22/2019  Ms. [name] was observed post Covid-19 immunization for 15 minutes without incident. She was provided with Vaccine Information Sheet and instruction to access the V-Safe system.   Ms. [name] was instructed to call 911 with any severe reactions post vaccine: Marland Kitchen Difficulty breathing  . Swelling of face and throat  . A fast heartbeat  . A bad rash all over body  . Dizziness and weakness   Immunizations Administered    Name Date Dose VIS Date Route   Pfizer COVID-19 Vaccine 10/22/2019  8:45 AM 0.3 mL 07/12/2019 Intramuscular   Manufacturer: [place]   Lot: [id number]   [place]: [id number]

## 2019-11-19 ENCOUNTER — Encounter: Payer: Self-pay | Admitting: Internal Medicine

## 2019-11-19 ENCOUNTER — Telehealth (INDEPENDENT_AMBULATORY_CARE_PROVIDER_SITE_OTHER): Payer: BC Managed Care – PPO | Admitting: Internal Medicine

## 2019-11-19 ENCOUNTER — Other Ambulatory Visit: Payer: Self-pay

## 2019-11-19 VITALS — Temp 97.5°F | Ht 59.0 in | Wt 150.0 lb

## 2019-11-19 DIAGNOSIS — K219 Gastro-esophageal reflux disease without esophagitis: Secondary | ICD-10-CM | POA: Diagnosis not present

## 2019-11-19 DIAGNOSIS — R110 Nausea: Secondary | ICD-10-CM | POA: Diagnosis not present
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
## 2019-11-28 ENCOUNTER — Telehealth: Payer: Self-pay | Admitting: Internal Medicine

## 2019-11-28 NOTE — Telephone Encounter (Signed)
Called patient and she stated that she has been taking the Pepcid BID and she is feeling better and her nausea is getting better. Patient stated that every time she eats she feels like she needs to go to the bathroom and she does not go and she is having gas and her stools are not fully formed.   Please advise if you want a follow up video visit or refer to GI as stated in last OV notes.

## 2019-11-28 NOTE — Telephone Encounter (Signed)
Talk to someone regarding her illness.  She had a virtual visit 11/19/2019.  Patient would like to be contacted to see what needs to be done next.  Please advise

## 2019-11-28 NOTE — Telephone Encounter (Signed)
Please set her up a Gi referral   Thanks  recurrent nausea and GI sx.

## 2019-11-29 ENCOUNTER — Encounter: Payer: Self-pay | Admitting: Gastroenterology

## 2019-11-29 ENCOUNTER — Other Ambulatory Visit: Payer: Self-pay

## 2019-11-29 DIAGNOSIS — R110 Nausea: Secondary | ICD-10-CM

## 2019-11-29 DIAGNOSIS — R198 Other specified symptoms and signs involving the digestive system and abdomen: Secondary | ICD-10-CM

## 2019-11-29 NOTE — Telephone Encounter (Signed)
I have placed the referral to GI for patient. Called patient and left a detailed voice message to let her know that I have placed the GI referral.

## 2020-01-02 ENCOUNTER — Other Ambulatory Visit: Payer: Self-pay | Admitting: Internal Medicine

## 2020-01-02 ENCOUNTER — Ambulatory Visit: Payer: BC Managed Care – PPO | Admitting: Gastroenterology

## 2020-01-08 ENCOUNTER — Encounter: Payer: Self-pay | Admitting: Internal Medicine

## 2020-01-08 ENCOUNTER — Other Ambulatory Visit: Payer: Self-pay

## 2020-01-08 ENCOUNTER — Ambulatory Visit (INDEPENDENT_AMBULATORY_CARE_PROVIDER_SITE_OTHER): Payer: BC Managed Care – PPO | Admitting: Internal Medicine

## 2020-01-08 VITALS — BP 126/82 | HR 95 | Temp 97.9°F | Ht 59.0 in | Wt 152.6 lb

## 2020-01-08 DIAGNOSIS — K219 Gastro-esophageal reflux disease without esophagitis: Secondary | ICD-10-CM | POA: Diagnosis not present

## 2020-01-08 DIAGNOSIS — K1379 Other lesions of oral mucosa: Secondary | ICD-10-CM | POA: Diagnosis not present

## 2020-01-08 DIAGNOSIS — R198 Other specified symptoms and signs involving the digestive system and abdomen: Secondary | ICD-10-CM | POA: Diagnosis not present

## 2020-01-08 MED ORDER — OMEPRAZOLE 20 MG PO CPDR: 20.0000 mg | DELAYED_RELEASE_CAPSULE | Freq: Every day | ORAL | 2 refills | Status: DC

## 2020-01-08 NOTE — Patient Instructions (Signed)
Take ppi acid blocker   As discussed   If throat sx do not improve then let me know after about 2 weeks  Keep Gi appt .
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
## 2020-01-21 ENCOUNTER — Encounter: Payer: Self-pay | Admitting: Internal Medicine

## 2020-01-21 ENCOUNTER — Telehealth (INDEPENDENT_AMBULATORY_CARE_PROVIDER_SITE_OTHER): Payer: BC Managed Care – PPO | Admitting: Internal Medicine

## 2020-01-21 ENCOUNTER — Other Ambulatory Visit: Payer: Self-pay

## 2020-01-21 VITALS — BP 126/75 | HR 88 | Temp 97.8°F | Ht 59.0 in | Wt 150.0 lb

## 2020-01-21 DIAGNOSIS — R197 Diarrhea, unspecified: Secondary | ICD-10-CM | POA: Diagnosis not present

## 2020-01-21 DIAGNOSIS — R198 Other specified symptoms and signs involving the digestive system and abdomen: Secondary | ICD-10-CM | POA: Diagnosis not present

## 2020-01-21 DIAGNOSIS — K219 Gastro-esophageal reflux disease without esophagitis: Secondary | ICD-10-CM | POA: Diagnosis not present
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
## 2020-01-21 NOTE — Telephone Encounter (Signed)
Since she is on schedule today will disc  at that time

## 2020-02-04 NOTE — Telephone Encounter (Signed)
Ok to go with your plan

## 2020-02-19 ENCOUNTER — Ambulatory Visit: Payer: BC Managed Care – PPO | Admitting: Gastroenterology

## 2020-02-19 ENCOUNTER — Encounter: Payer: Self-pay | Admitting: Gastroenterology

## 2020-02-19 VITALS — BP 126/76 | HR 94 | Ht 59.0 in | Wt 156.0 lb

## 2020-02-19 DIAGNOSIS — K219 Gastro-esophageal reflux disease without esophagitis: Secondary | ICD-10-CM | POA: Diagnosis not present

## 2020-02-19 MED ORDER — OMEPRAZOLE 20 MG PO CPDR: 20.0000 mg | DELAYED_RELEASE_CAPSULE | Freq: Every day | ORAL | 3 refills | Status: DC | PRN
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
## 2020-02-19 NOTE — Patient Instructions (Signed)
If you are age 61 or older, your body mass index should be between 23-30. Your Body mass index is 31.51 kg/m. If this is out of the aforementioned range listed, please consider follow up with your Primary Care Provider.  If you are age 56 or younger, your body mass index should be between 19-25. Your Body mass index is 31.51 kg/m. If this is out of the aformentioned range listed, please consider follow up with your Primary Care Provider.   We have sent the following medications to your pharmacy for you to pick up at your convenience: Omeprazole 20 mg: Take once daily as needed  Thank you for entrusting me with your care and for choosing [facility], Dr. Talpa Cellar

## 2020-03-23 ENCOUNTER — Ambulatory Visit: Payer: BC Managed Care – PPO | Admitting: Internal Medicine

## 2020-03-23 ENCOUNTER — Other Ambulatory Visit: Payer: Self-pay

## 2020-03-23 ENCOUNTER — Encounter: Payer: Self-pay | Admitting: Internal Medicine

## 2020-03-23 VITALS — BP 132/74 | HR 89 | Temp 98.3°F | Ht 59.0 in | Wt 157.8 lb

## 2020-03-23 DIAGNOSIS — R739 Hyperglycemia, unspecified: Secondary | ICD-10-CM | POA: Diagnosis not present

## 2020-03-23 DIAGNOSIS — K1379 Other lesions of oral mucosa: Secondary | ICD-10-CM

## 2020-03-23 DIAGNOSIS — E7800 Pure hypercholesterolemia, unspecified: Secondary | ICD-10-CM

## 2020-03-23 DIAGNOSIS — R05 Cough: Secondary | ICD-10-CM | POA: Diagnosis not present

## 2020-03-23 DIAGNOSIS — Z111 Encounter for screening for respiratory tuberculosis: Secondary | ICD-10-CM

## 2020-03-23 DIAGNOSIS — K219 Gastro-esophageal reflux disease without esophagitis: Secondary | ICD-10-CM

## 2020-03-23 DIAGNOSIS — R053 Chronic cough: Secondary | ICD-10-CM
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
## 2020-03-23 NOTE — Patient Instructions (Signed)
Make appt for fasting labs  Will include a quantiferon  Tb screening test. Get updated chest x ray. Will be  Notified about ENT appt referral to help  Evaluate the mouth symptoms .

## 2020-03-25 ENCOUNTER — Other Ambulatory Visit: Payer: Self-pay | Admitting: Internal Medicine

## 2020-03-25 ENCOUNTER — Other Ambulatory Visit (INDEPENDENT_AMBULATORY_CARE_PROVIDER_SITE_OTHER): Payer: BC Managed Care – PPO

## 2020-03-25 ENCOUNTER — Other Ambulatory Visit: Payer: Self-pay

## 2020-03-25 ENCOUNTER — Ambulatory Visit (INDEPENDENT_AMBULATORY_CARE_PROVIDER_SITE_OTHER)
Admission: RE | Admit: 2020-03-25 | Discharge: 2020-03-25 | Disposition: A | Discharge status: Home / Self Care | Payer: BC Managed Care – PPO | Source: Ambulatory Visit | Attending: Internal Medicine | Admitting: Internal Medicine

## 2020-03-25 DIAGNOSIS — E7800 Pure hypercholesterolemia, unspecified: Secondary | ICD-10-CM

## 2020-03-25 DIAGNOSIS — K1379 Other lesions of oral mucosa: Secondary | ICD-10-CM | POA: Diagnosis not present

## 2020-03-25 DIAGNOSIS — R739 Hyperglycemia, unspecified: Secondary | ICD-10-CM | POA: Diagnosis not present

## 2020-03-25 DIAGNOSIS — R053 Chronic cough: Secondary | ICD-10-CM

## 2020-03-25 DIAGNOSIS — R05 Cough: Secondary | ICD-10-CM

## 2020-03-25 DIAGNOSIS — K219 Gastro-esophageal reflux disease without esophagitis: Secondary | ICD-10-CM

## 2020-03-25 DIAGNOSIS — Z111 Encounter for screening for respiratory tuberculosis: Secondary | ICD-10-CM

## 2020-03-25 DIAGNOSIS — R7303 Prediabetes: Secondary | ICD-10-CM | POA: Diagnosis not present

## 2020-03-26 ENCOUNTER — Telehealth: Payer: Self-pay | Admitting: Internal Medicine

## 2020-03-26 LAB — HEPATIC FUNCTION PANEL
AG Ratio: 1.6 (calc) (ref 1.0–2.5)
ALT: 12 U/L (ref 6–29)
AST: 16 U/L (ref 10–35)
Albumin: 3.9 g/dL (ref 3.6–5.1)
Alkaline phosphatase (APISO): 80 U/L (ref 37–153)
Bilirubin, Direct: 0.1 mg/dL (ref 0.0–0.2)
Globulin: 2.4 g/dL (calc) (ref 1.9–3.7)
Indirect Bilirubin: 0.2 mg/dL (calc) (ref 0.2–1.2)
Total Bilirubin: 0.3 mg/dL (ref 0.2–1.2)
Total Protein: 6.3 g/dL (ref 6.1–8.1)

## 2020-03-26 LAB — CBC WITH DIFFERENTIAL/PLATELET
Absolute Monocytes: 840 cells/uL (ref 200–950)
Basophils Absolute: 70 cells/uL (ref 0–200)
Basophils Relative: 0.7 %
Eosinophils Absolute: 310 cells/uL (ref 15–500)
Eosinophils Relative: 3.1 %
HCT: 41.7 % (ref 35.0–45.0)
Hemoglobin: 13.7 g/dL (ref 11.7–15.5)
Lymphs Abs: 3050 cells/uL (ref 850–3900)
MCH: 28.7 pg (ref 27.0–33.0)
MCHC: 32.9 g/dL (ref 32.0–36.0)
MCV: 87.2 fL (ref 80.0–100.0)
MPV: 9.9 fL (ref 7.5–12.5)
Monocytes Relative: 8.4 %
Neutro Abs: 5730 cells/uL (ref 1500–7800)
Neutrophils Relative %: 57.3 %
Platelets: 305 10*3/uL (ref 140–400)
RBC: 4.78 10*6/uL (ref 3.80–5.10)
RDW: 12.5 % (ref 11.0–15.0)
Total Lymphocyte: 30.5 %
WBC: 10 10*3/uL (ref 3.8–10.8)

## 2020-03-26 LAB — BASIC METABOLIC PANEL WITH GFR
BUN: 15 mg/dL (ref 7–25)
CO2: 27 mmol/L (ref 20–32)
Calcium: 8.9 mg/dL (ref 8.6–10.4)
Chloride: 103 mmol/L (ref 98–110)
Creat: 0.6 mg/dL (ref 0.50–0.99)
GFR, Est African American: 114 mL/min/{1.73_m2} (ref >=60)
GFR, Est Non African American: 98 mL/min/{1.73_m2} (ref >=60)
Glucose, Bld: 104 mg/dL — ABNORMAL HIGH (ref 65–99)
Potassium: 4.8 mmol/L (ref 3.5–5.3)
Sodium: 139 mmol/L (ref 135–146)

## 2020-03-26 LAB — LIPID PANEL
Cholesterol: 212 mg/dL — ABNORMAL HIGH (ref <200)
HDL: 70 mg/dL (ref >=50)
LDL Cholesterol (Calc): 122 mg/dL (calc) — ABNORMAL HIGH
Non-HDL Cholesterol (Calc): 142 mg/dL (calc) — ABNORMAL HIGH (ref <130)
Total CHOL/HDL Ratio: 3 (calc) (ref <5.0)
Triglycerides: 95 mg/dL (ref <150)

## 2020-03-26 LAB — HEMOGLOBIN A1C
Hgb A1c MFr Bld: 6.1 % of total Hgb — ABNORMAL HIGH (ref <5.7)
Mean Plasma Glucose: 128 (calc)
eAG (mmol/L): 7.1 (calc)

## 2020-03-26 LAB — C-REACTIVE PROTEIN: CRP: 17.3 mg/L — ABNORMAL HIGH (ref <8.0)

## 2020-03-26 LAB — TSH: TSH: 1.33 mIU/L (ref 0.40–4.50)

## 2020-03-26 LAB — T4, FREE: Free T4: 1.3 ng/dL (ref 0.8–1.8)

## 2020-03-26 NOTE — Progress Notes (Signed)
C xray is normal and no acute findings  reassuring

## 2020-03-26 NOTE — Telephone Encounter (Signed)
Pt returning Megan's call about xray   Pt can be reached at [phone]

## 2020-03-27 LAB — QUANTIFERON-TB GOLD PLUS
Mitogen-NIL: >10 IU/mL
NIL: 0.23 IU/mL
QuantiFERON-TB Gold Plus: POSITIVE — AB
TB1-NIL: 1.11 IU/mL
TB2-NIL: 1.18 IU/mL

## 2020-03-27 NOTE — Telephone Encounter (Signed)
Pt call again and  stated she is waiting for a call back to talk about her result and stated please call on this # [phone].

## 2020-03-30 NOTE — Telephone Encounter (Signed)
Called patient and NOT able to leave a message to return call  Patient viewed results on MyChart on 03/30/2020 at 4:13pm.

## 2020-03-31 ENCOUNTER — Other Ambulatory Visit: Payer: Self-pay

## 2020-03-31 DIAGNOSIS — R7612 Nonspecific reaction to cell mediated immunity measurement of gamma interferon antigen response without active tuberculosis: Secondary | ICD-10-CM

## 2020-03-31 NOTE — Telephone Encounter (Signed)
I have called the number the patient provided and always receive a voice message and I leave a message for patient to call back. Patient has sent a MyChart message and I have responded to this also.  Please review labs and let me know results so we can send to patient as she is concerned and would like to discuss. Thank you!

## 2020-03-31 NOTE — Telephone Encounter (Signed)
Patient called after hours line. Patient reports she she missed a call from the office. The office called on her cell and she has mentioned before you cannot call her cell Caller is upset because she has been trying to reach the office since last Thursday

## 2020-03-31 NOTE — Progress Notes (Signed)
So blood sugar is ok  stable Inflammation marker is elevated and   your quantiferon is positive  ( this shouldn't be from your previous  BCG vaccine .  And Not sure it is related  to the problems you are having  We need to proceed with the ent evaluation and  then  would refer to the [facility] for positive quantiferon   her x ray is normal so  this may mean  she has a past infection   that needs to be addressed .  Then plan follow up after that

## 2020-04-02 ENCOUNTER — Encounter: Payer: Self-pay | Admitting: Internal Medicine

## 2020-04-02 ENCOUNTER — Telehealth (INDEPENDENT_AMBULATORY_CARE_PROVIDER_SITE_OTHER): Payer: BC Managed Care – PPO | Admitting: Internal Medicine

## 2020-04-02 ENCOUNTER — Other Ambulatory Visit: Payer: Self-pay

## 2020-04-02 VITALS — Temp 96.8°F | Ht 59.0 in | Wt 154.0 lb

## 2020-04-02 DIAGNOSIS — K1379 Other lesions of oral mucosa: Secondary | ICD-10-CM | POA: Diagnosis not present

## 2020-04-02 DIAGNOSIS — R7612 Nonspecific reaction to cell mediated immunity measurement of gamma interferon antigen response without active tuberculosis: Secondary | ICD-10-CM

## 2020-04-02 DIAGNOSIS — R7982 Elevated C-reactive protein (CRP): Secondary | ICD-10-CM
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
## 2020-04-02 NOTE — Telephone Encounter (Signed)
See visit sept 2

## 2020-04-03 DIAGNOSIS — R070 Pain in throat: Secondary | ICD-10-CM | POA: Diagnosis not present

## 2020-04-03 NOTE — Telephone Encounter (Signed)
See video visit  Sept 2

## 2020-04-13 DIAGNOSIS — Z03818 Encounter for observation for suspected exposure to other biological agents ruled out: Secondary | ICD-10-CM | POA: Diagnosis not present

## 2020-04-30 ENCOUNTER — Telehealth: Payer: Self-pay | Admitting: Internal Medicine

## 2020-04-30 NOTE — Telephone Encounter (Signed)
Pt is calling in stating that she would like to know if she is eligible for the Shingrix vaccine and if she can have it the same time as her flu shot.  Pt would like to have a call back so that she can be scheduled.

## 2020-04-30 NOTE — Telephone Encounter (Signed)
Patient would like for Dr. Regis Bill to advise.  Please see message. Please advise. Thank you.

## 2020-05-04 DIAGNOSIS — Z03818 Encounter for observation for suspected exposure to other biological agents ruled out: Secondary | ICD-10-CM | POA: Diagnosis not present

## 2020-05-04 NOTE — Telephone Encounter (Signed)
Called patient and LMOVM to return call  Left a detailed voice message for patient to call back to schedule a nurse visit for both of her vaccines.

## 2020-05-04 NOTE — Telephone Encounter (Signed)
Already answered  See other message thread . Ok to give at same time

## 2020-05-04 NOTE — Telephone Encounter (Signed)
Yes she is eligible  Ok to get shingrix and flu vaccine  At same time .

## 2020-05-19 ENCOUNTER — Other Ambulatory Visit: Payer: Self-pay

## 2020-05-19 ENCOUNTER — Ambulatory Visit (INDEPENDENT_AMBULATORY_CARE_PROVIDER_SITE_OTHER): Payer: BC Managed Care – PPO | Admitting: *Deleted

## 2020-05-19 DIAGNOSIS — Z23 Encounter for immunization: Secondary | ICD-10-CM

## 2020-05-19 DIAGNOSIS — Z03818 Encounter for observation for suspected exposure to other biological agents ruled out: Secondary | ICD-10-CM | POA: Diagnosis not present

## 2020-06-03 DIAGNOSIS — Z23 Encounter for immunization: Secondary | ICD-10-CM | POA: Diagnosis not present
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
## 2020-06-16 ENCOUNTER — Ambulatory Visit: Payer: BC Managed Care – PPO | Admitting: Internal Medicine

## 2020-06-16 ENCOUNTER — Encounter: Payer: Self-pay | Admitting: Internal Medicine

## 2020-06-16 ENCOUNTER — Other Ambulatory Visit: Payer: Self-pay

## 2020-06-16 VITALS — BP 132/78 | HR 90 | Temp 98.3°F | Ht 59.0 in | Wt 157.6 lb

## 2020-06-16 DIAGNOSIS — R5383 Other fatigue: Secondary | ICD-10-CM

## 2020-06-16 DIAGNOSIS — F418 Other specified anxiety disorders: Secondary | ICD-10-CM

## 2020-06-16 DIAGNOSIS — M7918 Myalgia, other site: Secondary | ICD-10-CM

## 2020-06-16 DIAGNOSIS — E669 Obesity, unspecified: Secondary | ICD-10-CM

## 2020-06-16 NOTE — Patient Instructions (Signed)
Exam is reassuring  Get weight   Down to help no matter what the cause of  Pain . Lab pending We can consider  Seeing  Rheumatology   Can try muscle relaxant at night  Or taking tylenol before bed also   Tired could be from poor sleep   And aggravate any ache and pain .   Plan fu after al results are back .

## 2020-06-17 LAB — COMPREHENSIVE METABOLIC PANEL
AG Ratio: 1.5 (calc) (ref 1.0–2.5)
ALT: 11 U/L (ref 6–29)
AST: 16 U/L (ref 10–35)
Albumin: 4 g/dL (ref 3.6–5.1)
Alkaline phosphatase (APISO): 89 U/L (ref 37–153)
BUN: 13 mg/dL (ref 7–25)
CO2: 27 mmol/L (ref 20–32)
Calcium: 10 mg/dL (ref 8.6–10.4)
Chloride: 102 mmol/L (ref 98–110)
Creat: 0.64 mg/dL (ref 0.50–0.99)
Globulin: 2.6 g/dL (calc) (ref 1.9–3.7)
Glucose, Bld: 96 mg/dL (ref 65–99)
Potassium: 4.4 mmol/L (ref 3.5–5.3)
Sodium: 139 mmol/L (ref 135–146)
Total Bilirubin: 0.4 mg/dL (ref 0.2–1.2)
Total Protein: 6.6 g/dL (ref 6.1–8.1)

## 2020-06-17 LAB — CBC WITH DIFFERENTIAL/PLATELET
Absolute Monocytes: 613 cells/uL (ref 200–950)
Basophils Absolute: 59 cells/uL (ref 0–200)
Basophils Relative: 0.7 %
Eosinophils Absolute: 294 cells/uL (ref 15–500)
Eosinophils Relative: 3.5 %
HCT: 42.2 % (ref 35.0–45.0)
Hemoglobin: 13.8 g/dL (ref 11.7–15.5)
Lymphs Abs: 2554 cells/uL (ref 850–3900)
MCH: 28.5 pg (ref 27.0–33.0)
MCHC: 32.7 g/dL (ref 32.0–36.0)
MCV: 87 fL (ref 80.0–100.0)
MPV: 9.6 fL (ref 7.5–12.5)
Monocytes Relative: 7.3 %
Neutro Abs: 4880 cells/uL (ref 1500–7800)
Neutrophils Relative %: 58.1 %
Platelets: 274 10*3/uL (ref 140–400)
RBC: 4.85 10*6/uL (ref 3.80–5.10)
RDW: 12.6 % (ref 11.0–15.0)
Total Lymphocyte: 30.4 %
WBC: 8.4 10*3/uL (ref 3.8–10.8)

## 2020-06-17 LAB — CYCLIC CITRUL PEPTIDE ANTIBODY, IGG: Cyclic Citrullin Peptide Ab: <16 UNITS

## 2020-06-17 LAB — IRON,TIBC AND FERRITIN PANEL
%SAT: 18 % (calc) (ref 16–45)
Ferritin: 71 ng/mL (ref 16–288)
Iron: 76 ug/dL (ref 45–160)
TIBC: 419 mcg/dL (calc) (ref 250–450)

## 2020-06-17 LAB — HEPATITIS C ANTIBODY
Hepatitis C Ab: NONREACTIVE
SIGNAL TO CUT-OFF: 0 (ref <1.00)

## 2020-06-17 LAB — CELIAC DISEASE COMPREHENSIVE PANEL WITH REFLEXES
(tTG) Ab, IgA: <1 U/mL
Immunoglobulin A: 252 mg/dL (ref 70–320)

## 2020-06-17 LAB — SEDIMENTATION RATE

## 2020-06-17 LAB — C-REACTIVE PROTEIN: CRP: 17.9 mg/L — ABNORMAL HIGH (ref <8.0)

## 2020-06-17 LAB — RHEUMATOID FACTOR: Rheumatoid fact SerPl-aCnc: <14 IU/mL (ref <14)

## 2020-06-17 LAB — SED RATE MANUAL WEST RFLX: SED RATE BY MODIFIED WESTERGREN,MANUAL: 18 mm/h (ref 0–30)

## 2020-06-17 LAB — HEPATITIS B SURFACE ANTIBODY,QUALITATIVE: Hep B S Ab: REACTIVE — AB

## 2020-06-17 LAB — HEPATITIS B SURFACE ANTIGEN: Hepatitis B Surface Ag: NONREACTIVE

## 2020-06-17 LAB — ANA: Anti Nuclear Antibody (ANA): NEGATIVE

## 2020-06-22 NOTE — Progress Notes (Signed)
Results are normal  or ok except mild elevation of inflammation marker  ( crp) this is non specific  non diagnostic finding  and arthritis markers are normal  .   So  if doing ok   plan fu  in about a month  ( after  holiday?)  To reassess.  Hava a good trip to [place]

## 2020-06-24 ENCOUNTER — Other Ambulatory Visit: Payer: Self-pay | Admitting: Internal Medicine

## 2020-06-30 DIAGNOSIS — Z03818 Encounter for observation for suspected exposure to other biological agents ruled out: Secondary | ICD-10-CM | POA: Diagnosis not present

## 2020-07-02 ENCOUNTER — Ambulatory Visit: Payer: BC Managed Care – PPO

## 2020-07-02 DIAGNOSIS — Z23 Encounter for immunization: Secondary | ICD-10-CM | POA: Diagnosis not present

## 2020-07-13 DIAGNOSIS — Z03818 Encounter for observation for suspected exposure to other biological agents ruled out: Secondary | ICD-10-CM | POA: Diagnosis not present

## 2020-07-14 DIAGNOSIS — Z03818 Encounter for observation for suspected exposure to other biological agents ruled out: Secondary | ICD-10-CM | POA: Diagnosis not present

## 2020-07-20 ENCOUNTER — Other Ambulatory Visit: Payer: Self-pay | Admitting: Internal Medicine

## 2020-07-20 DIAGNOSIS — Z1231 Encounter for screening mammogram for malignant neoplasm of breast: Secondary | ICD-10-CM

## 2020-07-20 DIAGNOSIS — H02885 Meibomian gland dysfunction left lower eyelid: Secondary | ICD-10-CM | POA: Diagnosis not present

## 2020-07-20 DIAGNOSIS — H25013 Cortical age-related cataract, bilateral: Secondary | ICD-10-CM | POA: Diagnosis not present

## 2020-07-20 DIAGNOSIS — H02882 Meibomian gland dysfunction right lower eyelid: Secondary | ICD-10-CM | POA: Diagnosis not present

## 2020-08-03 ENCOUNTER — Ambulatory Visit: Payer: BC Managed Care – PPO | Admitting: Internal Medicine
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
## 2020-08-05 ENCOUNTER — Ambulatory Visit: Payer: BC Managed Care – PPO | Admitting: Internal Medicine

## 2020-08-05 ENCOUNTER — Encounter: Payer: Self-pay | Admitting: Internal Medicine

## 2020-08-05 ENCOUNTER — Ambulatory Visit (INDEPENDENT_AMBULATORY_CARE_PROVIDER_SITE_OTHER): Payer: BC Managed Care – PPO

## 2020-08-05 ENCOUNTER — Other Ambulatory Visit: Payer: Self-pay

## 2020-08-05 VITALS — BP 130/70 | HR 91 | Temp 98.2°F | Ht 59.0 in | Wt 156.8 lb

## 2020-08-05 DIAGNOSIS — R7982 Elevated C-reactive protein (CRP): Secondary | ICD-10-CM

## 2020-08-05 DIAGNOSIS — M545 Low back pain, unspecified: Secondary | ICD-10-CM | POA: Diagnosis not present

## 2020-08-05 DIAGNOSIS — M7918 Myalgia, other site: Secondary | ICD-10-CM

## 2020-08-05 DIAGNOSIS — M533 Sacrococcygeal disorders, not elsewhere classified: Secondary | ICD-10-CM | POA: Diagnosis not present

## 2020-08-05 NOTE — Patient Instructions (Signed)
I am glad you are doing better  Getting  X ray  Back and si area.  Consider seeing specialist  Sports medicine or   Rheumatology .   Plan follo wup depending .
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
## 2020-08-09 NOTE — Progress Notes (Signed)
Mild to noderate  facet  arthritis changes.  May we refer  you to rheumatology  ?  Dx pain buttocks and elevaetd CRP. If so  please do referral

## 2020-08-10 DIAGNOSIS — Z03818 Encounter for observation for suspected exposure to other biological agents ruled out: Secondary | ICD-10-CM | POA: Diagnosis not present

## 2020-08-13 ENCOUNTER — Telehealth (INDEPENDENT_AMBULATORY_CARE_PROVIDER_SITE_OTHER): Payer: BC Managed Care – PPO | Admitting: Family Medicine

## 2020-08-13 DIAGNOSIS — K219 Gastro-esophageal reflux disease without esophagitis: Secondary | ICD-10-CM | POA: Diagnosis not present

## 2020-08-13 DIAGNOSIS — F419 Anxiety disorder, unspecified: Secondary | ICD-10-CM | POA: Diagnosis not present

## 2020-08-13 DIAGNOSIS — U071 COVID-19: Secondary | ICD-10-CM

## 2020-08-13 MED ORDER — ONDANSETRON HCL 4 MG PO TABS: 4.0000 mg | ORAL_TABLET | Freq: Three times a day (TID) | ORAL | 0 refills | Status: DC | PRN

## 2020-08-13 NOTE — Patient Instructions (Addendum)
-  I sent the medication(s) we discussed to your pharmacy: Meds ordered this encounter  Medications  . ondansetron (ZOFRAN) 4 MG tablet    Sig: Take 1 tablet (4 mg total) by mouth every 8 (eight) hours as needed for nausea or vomiting.    Dispense:  20 tablet    Refill:  0   Please eat a healthy diet and avoid triggers for reflux.  Can take the Prilosec 20 mg daily for the acid reflux for 1 week.  Please continue to wear a good mask (preferably an N95 or can 95 )that is well fitted at all times when you are around others, use good hygiene and social distancing.  Stay home if you are feeling sick.  I hope you are feeling better soon!  Seek in follow up visit through your primary care office or inperson care promptly if your symptoms worsen, new concerns arise or you are not improving with treatment.  It was nice to meet you today. I help Crystal out with telemedicine visits on Tuesdays and Thursdays and am available for visits on those days. If you have any concerns or questions following this visit please schedule a follow up visit with your Primary Care doctor or seek care at a local urgent care clinic to avoid delays in care.
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
## 2020-08-19 DIAGNOSIS — M47817 Spondylosis without myelopathy or radiculopathy, lumbosacral region: Secondary | ICD-10-CM

## 2020-08-24 NOTE — Telephone Encounter (Signed)
Please do referral to Dr Tonita Cong  [facility] abut buttock pain and facet arthritis .

## 2020-08-25 ENCOUNTER — Telehealth: Payer: Self-pay

## 2020-08-26 ENCOUNTER — Telehealth: Payer: Self-pay | Admitting: Internal Medicine

## 2020-08-26 NOTE — Telephone Encounter (Signed)
Patient is calling regarding a referral that she requested through my chart.  Patient would like someone to send her a message through my chart once the referral has been put in.   Please advise.

## 2020-08-26 NOTE — Telephone Encounter (Signed)
Order was placed yesterday  To dr Maxie Better  [facility]

## 2020-08-26 NOTE — Telephone Encounter (Signed)
Patient is calling regarding a referral that she requested through my chart.   Patient would like someone to send her a message through my chart once the referral has been put in. Nina,cma

## 2020-08-26 NOTE — Telephone Encounter (Signed)
Message sent to patient via mychart.  Winfred Redel,cma  

## 2020-08-28 ENCOUNTER — Ambulatory Visit: Payer: BC Managed Care – PPO

## 2020-09-02 ENCOUNTER — Encounter: Payer: Self-pay | Admitting: Gastroenterology

## 2020-09-02 ENCOUNTER — Ambulatory Visit: Payer: BC Managed Care – PPO | Admitting: Gastroenterology

## 2020-09-02 VITALS — BP 130/72 | HR 101 | Ht 59.0 in | Wt 155.0 lb

## 2020-09-02 DIAGNOSIS — K219 Gastro-esophageal reflux disease without esophagitis: Secondary | ICD-10-CM | POA: Diagnosis not present

## 2020-09-02 DIAGNOSIS — Z79899 Other long term (current) drug therapy: Secondary | ICD-10-CM

## 2020-09-02 DIAGNOSIS — R1013 Epigastric pain: Secondary | ICD-10-CM

## 2020-09-02 MED ORDER — OMEPRAZOLE 20 MG PO CPDR
20.0000 mg | DELAYED_RELEASE_CAPSULE | Freq: Every day | ORAL | 1 refills | Status: DC
Start: 2020-09-02 — End: 2021-05-17
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
## 2020-09-02 NOTE — Patient Instructions (Addendum)
If you are age 62 or older, your body mass index should be between 23-30. Your Body mass index is 31.31 kg/m. If this is out of the aforementioned range listed, please consider follow up with your Primary Care Provider.  If you are age 71 or younger, your body mass index should be between 19-25. Your Body mass index is 31.31 kg/m. If this is out of the aformentioned range listed, please consider follow up with your Primary Care Provider.    You have been scheduled for an endoscopy. Please follow written instructions given to you at your visit today. If you use inhalers (even only as needed), please bring them with you on the day of your procedure.  We have sent the following medications to your pharmacy [facility]) for you to pick up at your convenience: Omeprazole 20 mg: Take once a day  Thank you for entrusting me with your care and for choosing [facility], Dr. [place] Cellar

## 2020-09-08 DIAGNOSIS — R070 Pain in throat: Secondary | ICD-10-CM | POA: Diagnosis not present

## 2020-09-08 DIAGNOSIS — Z23 Encounter for immunization: Secondary | ICD-10-CM | POA: Diagnosis not present

## 2020-09-16 DIAGNOSIS — M5416 Radiculopathy, lumbar region: Secondary | ICD-10-CM | POA: Diagnosis not present

## 2020-09-16 DIAGNOSIS — M5136 Other intervertebral disc degeneration, lumbar region: Secondary | ICD-10-CM | POA: Diagnosis not present

## 2020-09-17 ENCOUNTER — Inpatient Hospital Stay: Admission: RE | Admit: 2020-09-17 | Payer: BC Managed Care – PPO | Source: Ambulatory Visit

## 2020-09-18 ENCOUNTER — Other Ambulatory Visit: Payer: Self-pay

## 2020-09-18 ENCOUNTER — Ambulatory Visit: Payer: BC Managed Care – PPO | Admitting: Obstetrics and Gynecology

## 2020-09-18 ENCOUNTER — Encounter: Payer: Self-pay | Admitting: Obstetrics and Gynecology

## 2020-09-18 VITALS — BP 122/80 | HR 88 | Ht 58.75 in | Wt 158.0 lb

## 2020-09-18 DIAGNOSIS — Z01419 Encounter for gynecological examination (general) (routine) without abnormal findings: Secondary | ICD-10-CM | POA: Diagnosis not present

## 2020-09-18 DIAGNOSIS — M8580 Other specified disorders of bone density and structure, unspecified site: Secondary | ICD-10-CM | POA: Diagnosis not present

## 2020-09-18 NOTE — Progress Notes (Signed)
   Yaslene Lindamood Jul 29, 1961 [id number]  SUBJECTIVE:  60 y.o. [id number] female for annual routine gynecologic exam and Pap smear. She has no gynecologic concerns.  Current Outpatient Medications  Medication Sig Dispense Refill  . B Complex Vitamins (B COMPLEX 1 PO) Take by mouth.    . Calcium Carbonate-Vit D-Min (CALTRATE PLUS PO) Take 1 tablet by mouth daily.    . Cholecalciferol (VITAMIN D HIGH POTENCY PO) Take by mouth. 5000 units per day    . losartan (COZAAR) 50 MG tablet TAKE ONE TABLET BY MOUTH ONE TIME DAILY 90 tablet 2  . omeprazole (PRILOSEC) 20 MG capsule Take 1 capsule (20 mg total) by mouth daily. 90 capsule 1   No current facility-administered medications for this visit.   Allergies: Aspirin  No LMP recorded. Patient is postmenopausal.  Past medical history,surgical history, problem list, medications, allergies, family history and social history were all reviewed and documented as reviewed in the EPIC chart.  ROS: Positives and negatives as reviewed in HPI  OBJECTIVE:  BP 122/80 (BP Location: Left Arm, Patient Position: Sitting, Cuff Size: Normal)   Pulse 88   Ht 4' 10.75" (1.492 m)   Wt 158 lb (71.7 kg)   BMI 32.18 kg/m  The patient appears well, alert, oriented x 3, in no distress. ENT normal.  Neck supple. No cervical or supraclavicular adenopathy or thyromegaly.  Lungs are clear, good air entry, no wheezes, rhonchi or rales. S1 and S2 normal, no murmurs, regular rate and rhythm.  Abdomen soft without tenderness, guarding, mass or organomegaly.  Neurological is normal, no focal findings.  BREAST EXAM: breasts appear normal, no suspicious masses, no skin or nipple changes or axillary nodes  PELVIC EXAM: VULVA: normal appearing vulva with atrophic change, no masses, tenderness or lesions, VAGINA: normal appearing vagina with atrophic change, normal color and discharge, no lesions, CERVIX: normal appearing cervix without discharge or lesions, UTERUS: uterus is normal  size, shape, consistency and nontender, ADNEXA: normal adnexa in size, nontender and no masses, RECTAL: declined Chaperone: Terence Lux present during the examination  ASSESSMENT:  60 y.o. [id number] here for annual gynecologic exam  PLAN:   1. Postmenopausal. No significant symptoms at this time. 2. Pap smear/HPV 04/2018.  No significant history of abnormal Pap smears.  Next Pap smear due 2024 following the current guidelines recommending the 5 year interval. 3. Mammogram scheduled for tomorrow.  Normal breast exam today.   4. Colonoscopy 2016.  Recommended that she follow up at the recommended interval.   5. Osteopenia.  DEXA 04/2018 T score -1.4. I recommend that she schedule DEXA this year, test is ordered.  Indicates she is supplementing with vitamin D calcium per direction of her primary doctor.   6. Health maintenance.  No labs today as she normally has these completed elsewhere and indicates she has an upcoming physical with her primary care doctor.  The patient is aware that I will only be at this practice until early March 2022 so she knows to make sure she requests follow-up on results for any medical tests that she does when I am no longer at the practice.   Return annually or sooner, prn.  Joseph Pierini MD  09/18/20

## 2020-09-19 ENCOUNTER — Ambulatory Visit
Admission: RE | Admit: 2020-09-19 | Discharge: 2020-09-19 | Disposition: A | Discharge status: Home / Self Care | Payer: BC Managed Care – PPO | Source: Ambulatory Visit | Attending: Internal Medicine | Admitting: Internal Medicine

## 2020-09-19 DIAGNOSIS — Z1231 Encounter for screening mammogram for malignant neoplasm of breast: Secondary | ICD-10-CM | POA: Diagnosis not present

## 2020-10-02 NOTE — Telephone Encounter (Signed)
Error. ng 

## 2020-10-07 DIAGNOSIS — D320 Benign neoplasm of cerebral meninges: Secondary | ICD-10-CM | POA: Diagnosis not present

## 2020-10-09 DIAGNOSIS — Z6831 Body mass index (BMI) 31.0-31.9, adult: Secondary | ICD-10-CM | POA: Diagnosis not present

## 2020-10-09 DIAGNOSIS — I10 Essential (primary) hypertension: Secondary | ICD-10-CM | POA: Diagnosis not present

## 2020-10-09 DIAGNOSIS — D320 Benign neoplasm of cerebral meninges: Secondary | ICD-10-CM | POA: Diagnosis not present

## 2020-10-15 ENCOUNTER — Encounter: Payer: Self-pay | Admitting: Gastroenterology

## 2020-10-16 ENCOUNTER — Ambulatory Visit (AMBULATORY_SURGERY_CENTER): Payer: BC Managed Care – PPO | Admitting: Gastroenterology

## 2020-10-16 ENCOUNTER — Encounter: Payer: Self-pay | Admitting: Gastroenterology

## 2020-10-16 ENCOUNTER — Other Ambulatory Visit: Payer: Self-pay

## 2020-10-16 VITALS — BP 126/58 | HR 85 | Temp 97.7°F | Resp 16 | Ht 59.0 in | Wt 155.0 lb

## 2020-10-16 DIAGNOSIS — R1013 Epigastric pain: Secondary | ICD-10-CM

## 2020-10-16 DIAGNOSIS — K3189 Other diseases of stomach and duodenum: Secondary | ICD-10-CM | POA: Diagnosis not present

## 2020-10-16 DIAGNOSIS — K449 Diaphragmatic hernia without obstruction or gangrene: Secondary | ICD-10-CM | POA: Diagnosis not present

## 2020-10-16 DIAGNOSIS — K219 Gastro-esophageal reflux disease without esophagitis: Secondary | ICD-10-CM

## 2020-10-16 DIAGNOSIS — K319 Disease of stomach and duodenum, unspecified: Secondary | ICD-10-CM | POA: Diagnosis not present

## 2020-10-16 MED ORDER — SODIUM CHLORIDE 0.9 % IV SOLN: 500.0000 mL | Freq: Once | INTRAVENOUS | Status: DC

## 2020-10-16 NOTE — Patient Instructions (Signed)
YOU HAD AN ENDOSCOPIC PROCEDURE TODAY AT THE [facility]:   Refer to the procedure report that was given to you for any specific questions about what was found during the examination.  If the procedure report does not answer your questions, please call your gastroenterologist to clarify.  If you requested that your care partner not be given the details of your procedure findings, then the procedure report has been included in a sealed envelope for you to review at your convenience later.  YOU SHOULD EXPECT: Some feelings of bloating in the abdomen. Passage of more gas than usual.  Walking can help get rid of the air that was put into your GI tract during the procedure and reduce the bloating. If you had a lower endoscopy (such as a colonoscopy or flexible sigmoidoscopy) you may notice spotting of blood in your stool or on the toilet paper. If you underwent a bowel prep for your procedure, you may not have a normal bowel movement for a few days.  Please Note:  You might notice some irritation and congestion in your nose or some drainage.  This is from the oxygen used during your procedure.  There is no need for concern and it should clear up in a day or so.  SYMPTOMS TO REPORT IMMEDIATELY:   Following lower endoscopy (colonoscopy or flexible sigmoidoscopy):  Excessive amounts of blood in the stool  Significant tenderness or worsening of abdominal pains  Swelling of the abdomen that is new, acute  Fever of 100F or higher   Following upper endoscopy (EGD)  Vomiting of blood or coffee ground material  New chest pain or pain under the shoulder blades  Painful or persistently difficult swallowing  New shortness of breath  Fever of 100F or higher  Black, tarry-looking stools  For urgent or emergent issues, a gastroenterologist can be reached at any hour by calling [phone]. Do not use MyChart messaging for urgent concerns.    DIET:  We do recommend a small meal at first, but  then you may proceed to your regular diet.  Drink plenty of fluids but you should avoid alcoholic beverages for 24 hours.  ACTIVITY:  You should plan to take it easy for the rest of today and you should NOT DRIVE or use heavy machinery until tomorrow (because of the sedation medicines used during the test).    FOLLOW UP: Our staff will call the number listed on your records 48-72 hours following your procedure to check on you and address any questions or concerns that you may have regarding the information given to you following your procedure. If we do not reach you, we will leave a message.  We will attempt to reach you two times.  During this call, we will ask if you have developed any symptoms of COVID 19. If you develop any symptoms (ie: fever, flu-like symptoms, shortness of breath, cough etc.) before then, please call [phone].  If you test positive for Covid 19 in the 2 weeks post procedure, please call and report this information to us.    If any biopsies were taken you will be contacted by phone or by letter within the next 1-3 weeks.  Please call us at [phone] if you have not heard about the biopsies in 3 weeks.    SIGNATURES/CONFIDENTIALITY: You and/or your care partner have signed paperwork which will be entered into your electronic medical record.  These signatures attest to the fact that that the information above on   your After Visit Summary has been reviewed and is understood.  Full responsibility of the confidentiality of this discharge information lies with you and/or your care-partner. 

## 2020-10-16 NOTE — Progress Notes (Signed)
Called to room to assist during endoscopic procedure.  Patient ID and intended procedure confirmed with present staff. Received instructions for my participation in the procedure from the performing physician.Called to room to assist during endoscopic procedure.  Patient ID and intended procedure confirmed with present staff. Received instructions for my participation in the procedure from the performing physician.Called to room to assist during endoscopic procedure.  Patient ID and intended procedure confirmed with present staff. Received instructions for my participation in the procedure from the performing physician.

## 2020-10-16 NOTE — Progress Notes (Signed)
VS by CW. ?

## 2020-10-16 NOTE — Op Note (Signed)
[facility] Patient Name: [name] Procedure Date: 10/16/2020 10:41 AM MRN: [id number] Endoscopist: [name] , MD Age: 60 Referring MD:  Date of Birth: 07/19/1961 Gender: Female Account #: [id number] Procedure:                Upper GI endoscopy Indications:              history of gastro-esophageal reflux disease,                            history of epigastric pain - resolves reliably with                            omeprazole 20mg  / day, history of osteopenia, rule                            out Barrett's, assess long term need for PPI Medicines:                Monitored Anesthesia Care Procedure:                Pre-Anesthesia Assessment:                           - Prior to the procedure, a History and Physical                            was performed, and patient medications and                            allergies were reviewed. The patient's tolerance of                            previous anesthesia was also reviewed. The risks                            and benefits of the procedure and the sedation                            options and risks were discussed with the patient.                            All questions were answered, and informed consent                            was obtained. Prior Anticoagulants: The patient has                            taken no previous anticoagulant or antiplatelet                            agents. ASA Grade Assessment: II - A patient with                            mild systemic disease. After reviewing the risks  and benefits, the patient was deemed in                            satisfactory condition to undergo the procedure.                           After obtaining informed consent, the endoscope was                            passed under direct vision. Throughout the                            procedure, the patient's blood pressure, pulse, and                             oxygen saturations were monitored continuously. The                            Endoscope was introduced through the mouth, and                            advanced to the second part of duodenum. The upper                            GI endoscopy was accomplished without difficulty.                            The patient tolerated the procedure well. Scope In: Scope Out: Findings:                 Esophagogastric landmarks were identified: the                            Z-line was found at 34 cm, the gastroesophageal                            junction was found at 34 cm and the upper extent of                            the gastric folds was found at 35 cm from the                            incisors.                           A 1 cm hiatal hernia was present.                           The exam of the esophagus was otherwise normal. No                            Barrett's                           The entire examined stomach  was normal. Biopsies                            were taken with a cold forceps for Helicobacter                            pylori testing.                           The gastroesophageal flap valve was visualized                            endoscopically and classified as Hill Grade I                            (prominent fold, tight to endoscope).                           The duodenal bulb and second portion of the                            duodenum were normal. Complications:            No immediate complications. Estimated blood loss:                            Minimal. Estimated Blood Loss:     Estimated blood loss was minimal. Impression:               - Esophagogastric landmarks identified.                           - 1 cm hiatal hernia.                           - Normal esophagus otherwise                           - Hill grade I views of the cardia                           - Normal stomach. Biopsied to rule out H pylori.                           - Normal  duodenal bulb and second portion of the                            duodenum. Recommendation:           - Patient has a contact number available for                            emergencies. The signs and symptoms of potential                            delayed complications were discussed with the  patient. Return to normal activities tomorrow.                            Written discharge instructions were provided to the                            patient.                           - Resume previous diet.                           - Continue present medications.                           - Continue omeprazole 20mg  as needed                           - Await pathology results. Julia Lipps P. Julia Markov, MD 10/16/2020 10:59:04 AM This report has been signed electronically.

## 2020-10-16 NOTE — Progress Notes (Signed)
To PACU, VSS. Report to [email or web address] 

## 2020-10-20 ENCOUNTER — Telehealth: Payer: Self-pay | Admitting: *Deleted

## 2020-10-20 NOTE — Telephone Encounter (Signed)
  Follow up Call-  Call back number 10/16/2020  Post procedure Call Back phone  # [phone]  Permission to leave phone message Yes  Some recent data might be hidden     Patient questions:  Do you have a fever, pain , or abdominal swelling? No. Pain Score  0 *  Have you tolerated food without any problems? Yes.    Have you been able to return to your normal activities? Yes.    Do you have any questions about your discharge instructions: Diet   No. Medications  No. Follow up visit  No.  Do you have questions or concerns about your Care? No.  Actions: * If pain score is 4 or above: No action needed, pain <4.  1. Have you developed a fever since your procedure? no  2.   Have you had an respiratory symptoms (SOB or cough) since your procedure? no  3.   Have you tested positive for COVID 19 since your procedure no  4.   Have you had any family members/close contacts diagnosed with the COVID 19 since your procedure?  no   If yes to any of these questions please route to Joylene John, RN and Joella Prince, RN

## 2020-10-20 NOTE — Telephone Encounter (Signed)
Follow up call made. 

## 2020-10-20 NOTE — Telephone Encounter (Signed)
Returning call to advise that everything is fine. She had no problems at all after her procedure.

## 2020-10-23 ENCOUNTER — Telehealth: Payer: Self-pay | Admitting: Internal Medicine

## 2020-10-23 NOTE — Telephone Encounter (Signed)
So  Please confirm  Destination( not chloroquine resistant destination) to be sure chloroquine is the correct  Medication And then I will send in the medication (You took doycycline at last  Travel .)

## 2020-10-23 NOTE — Telephone Encounter (Signed)
Pts spouse is calling in needing a medication (chloroquine) for antimalarial due to her going to [place] on a emergency trip in the next 5 day and will be gone for 20 days.  Pharm:  [facility] in [place], [place].

## 2020-10-23 NOTE — Telephone Encounter (Signed)
Spoke with the patients husband and he confirmed that Chloroquine was the correct medication they were requesting.

## 2020-10-24 MED ORDER — CHLOROQUINE PHOSPHATE 500 MG PO TABS: ORAL_TABLET | ORAL | 0 refills | Status: DC

## 2020-10-24 NOTE — Addendum Note (Signed)
Addended byShanon Ace K on: 10/24/2020 07:45 AM   Modules accepted: Orders

## 2020-10-26 NOTE — Telephone Encounter (Signed)
So chloroquine is taken weekly  Not daily For prophylaxis  See cdc  Quote below    Both adults and children should take one dose of chloroquine per week starting at least 1 week before traveling to the area where malaria transmission occurs. They should take one dose per week while there, and for 4 consecutive weeks after leaving. The weekly dosage for adults is 300mg  base (500mg  salt).  Medicines for the Prevention of Malaria While Traveling - CDC  Please check again about chloroquine resistance in the area of travel on cdc web site    Few areas are  chloroquine sensitive

## 2020-12-04 ENCOUNTER — Other Ambulatory Visit: Payer: Self-pay

## 2020-12-07 ENCOUNTER — Ambulatory Visit: Payer: BC Managed Care – PPO | Admitting: Internal Medicine

## 2020-12-07 ENCOUNTER — Other Ambulatory Visit: Payer: Self-pay

## 2020-12-07 ENCOUNTER — Encounter: Payer: Self-pay | Admitting: Internal Medicine

## 2020-12-07 VITALS — BP 122/80 | HR 79 | Temp 98.5°F | Ht 59.0 in | Wt 154.6 lb

## 2020-12-07 DIAGNOSIS — D329 Benign neoplasm of meninges, unspecified: Secondary | ICD-10-CM

## 2020-12-07 DIAGNOSIS — M7989 Other specified soft tissue disorders: Secondary | ICD-10-CM | POA: Diagnosis not present
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
## 2020-12-07 NOTE — Patient Instructions (Signed)
This could be   venous insufficiency  . Doubt blood clot . Compression  Socks  Elevation walking.   If  persistent or progressive    Let us know Consider   Vascular referral .  Will do a referral for second opinion .as dicussed .

## 2020-12-09 NOTE — Telephone Encounter (Signed)
I have added those preferred names to the neurosurgery referral.

## 2020-12-10 ENCOUNTER — Telehealth: Payer: BC Managed Care – PPO | Admitting: Family Medicine

## 2020-12-10 DIAGNOSIS — R059 Cough, unspecified: Secondary | ICD-10-CM | POA: Diagnosis not present

## 2020-12-10 DIAGNOSIS — R0981 Nasal congestion: Secondary | ICD-10-CM

## 2020-12-10 MED ORDER — BENZONATATE 100 MG PO CAPS: 100.0000 mg | ORAL_CAPSULE | Freq: Three times a day (TID) | ORAL | 0 refills | Status: DC | PRN

## 2020-12-10 NOTE — Patient Instructions (Addendum)
  HOME CARE TIPS:  -[facility] COVID19 testing information: [email or web address] OR [phone] Most pharmacies also offer testing and home test kits. If the Covid19 test is positive, please make a prompt follow up visit with your primary care office or with [facility] to discuss treatment options. Treatments for Covid19 are best given early in the course of the illness.   -I sent the medication(s) we discussed to your pharmacy: Meds ordered this encounter  Medications   benzonatate (TESSALON PERLES) 100 MG capsule    Sig: Take 1 capsule (100 mg total) by mouth 3 (three) times daily as needed.    Dispense:  20 capsule    Refill:  0     -can use tylenol or aleve if needed for fevers, aches and pains per instructions  -can use nasal saline a few times per day if you have nasal congestion; sometimes  a short course of Afrin nasal spray for 3 days can help with symptoms as well  -stay hydrated, drink plenty of fluids and eat small healthy meals - avoid dairy  -can take 1000 IU (25mcg) Vit D3 and 100-500 mg of Vit C daily per instructions  -If the Covid test is positive, check out the CDC website for more information on home care, transmission and treatment for COVID19  -follow up with your doctor in 2-3 days unless improving and feeling better  -stay home while sick, except to seek medical care. If you have COVID19, ideally it would be best to stay home for a full 10 days since the onset of symptoms PLUS one day of no fever and feeling better. Wear a good mask that fits snugly (such as N95 or KN95) if around others to reduce the risk of transmission.  It was nice to meet you today, and I really hope you are feeling better soon. I help [facility] out with telemedicine visits on Tuesdays and Thursdays and am available for visits on those days. If you have any concerns or questions following this visit please schedule a follow up visit with your Primary  Care doctor or seek care at a local urgent care clinic to avoid delays in care.    Seek in person care or schedule a follow up video visit promptly if your symptoms worsen, new concerns arise or you are not improving with treatment. Call 911 and/or seek emergency care if your symptoms are severe or life threatening.   
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
## 2020-12-11 DIAGNOSIS — Z20822 Contact with and (suspected) exposure to covid-19: Secondary | ICD-10-CM | POA: Diagnosis not present

## 2020-12-14 ENCOUNTER — Other Ambulatory Visit: Payer: Self-pay

## 2020-12-14 ENCOUNTER — Ambulatory Visit: Payer: BC Managed Care – PPO | Admitting: Family Medicine

## 2020-12-14 ENCOUNTER — Encounter: Payer: Self-pay | Admitting: Family Medicine

## 2020-12-14 VITALS — BP 136/82 | HR 88 | Temp 98.2°F | Wt 155.8 lb

## 2020-12-14 DIAGNOSIS — J019 Acute sinusitis, unspecified: Secondary | ICD-10-CM

## 2020-12-14 MED ORDER — HYDROCODONE BIT-HOMATROP MBR 5-1.5 MG/5ML PO SOLN: 5.0000 mL | ORAL | 0 refills | Status: DC | PRN

## 2020-12-14 MED ORDER — ALBUTEROL SULFATE HFA 108 (90 BASE) MCG/ACT IN AERS: 2.0000 | INHALATION_SPRAY | RESPIRATORY_TRACT | 0 refills | Status: DC | PRN

## 2020-12-14 MED ORDER — AMOXICILLIN-POT CLAVULANATE 875-125 MG PO TABS: 1.0000 | ORAL_TABLET | Freq: Two times a day (BID) | ORAL | 0 refills | Status: DC

## 2020-12-14 NOTE — Progress Notes (Signed)
   Subjective:    Patient ID: [name], female    DOB: 1960/08/07, 60 y.o.   MRN: [id number]  HPI Here for 10 days of sinus pressure, headache, facial swelling, PND, blowing green mucus from the nose, and a dry cough. No fever. She has tried Benzonatate with no relief. Drinking fluids. She tested negative for the Covid virus at home. No body aches or SOB. No NVD.    Review of Systems  Constitutional: Negative.   HENT: Positive for congestion, facial swelling, postnasal drip and sinus pressure. Negative for sore throat.   Eyes: Negative.   Respiratory: Positive for cough and wheezing.   Cardiovascular: Negative.        Objective:   Physical Exam Constitutional:      Appearance: Normal appearance.  HENT:     Right Ear: Tympanic membrane, ear canal and external ear normal.     Left Ear: Tympanic membrane, ear canal and external ear normal.     Nose: Nose normal.     Mouth/Throat:     Pharynx: Oropharynx is clear.  Eyes:     Conjunctiva/sclera: Conjunctivae normal.  Pulmonary:     Effort: Pulmonary effort is normal. No respiratory distress.     Breath sounds: Normal breath sounds. No stridor. No wheezing, rhonchi or rales.  Lymphadenopathy:     Cervical: No cervical adenopathy.  Neurological:     Mental Status: She is alert.           Assessment & Plan:  Sinusitis, treat with Augmentin for 10 days. Add Hycodan syrup and albuterol inhaler as needed.  Alysia Penna, MD

## 2020-12-22 DIAGNOSIS — D329 Benign neoplasm of meninges, unspecified: Secondary | ICD-10-CM | POA: Diagnosis not present

## 2020-12-23 ENCOUNTER — Other Ambulatory Visit: Payer: Self-pay

## 2020-12-24 ENCOUNTER — Ambulatory Visit: Payer: BC Managed Care – PPO | Admitting: Family Medicine

## 2020-12-24 ENCOUNTER — Encounter: Payer: Self-pay | Admitting: Family Medicine

## 2020-12-24 VITALS — BP 132/74 | HR 70 | Temp 98.4°F | Wt 155.0 lb

## 2020-12-24 DIAGNOSIS — J019 Acute sinusitis, unspecified: Secondary | ICD-10-CM

## 2020-12-24 NOTE — Progress Notes (Signed)
   Subjective:    Patient ID: [name], female    DOB: Mar 20, 1961, 60 y.o.   MRN: [id number]  HPI Here to follow up on a sinus infection. She was seen on 12-14-20 and she was given a 10 day course of Augmentin. She now feels much better. The coughing has almost stopped and her voice is back to normal. She has been back to work . Her concern today is some continuing PND that irritates the back of her throat . No ST or fever.    Review of Systems  Constitutional: Negative.   HENT: Positive for postnasal drip. Negative for congestion, ear pain, sinus pressure, sinus pain and sore throat.   Eyes: Negative.   Respiratory: Negative.        Objective:   Physical Exam Constitutional:      Appearance: Normal appearance. She is not ill-appearing.  HENT:     Right Ear: Tympanic membrane, ear canal and external ear normal.     Left Ear: Tympanic membrane, ear canal and external ear normal.     Nose: Nose normal.     Mouth/Throat:     Pharynx: Oropharynx is clear.  Eyes:     Conjunctiva/sclera: Conjunctivae normal.  Pulmonary:     Effort: Pulmonary effort is normal.     Breath sounds: Normal breath sounds.  Lymphadenopathy:     Cervical: No cervical adenopathy.  Neurological:     Mental Status: She is alert.           Assessment & Plan:  She is getting over a sinusitis, and the PND is irritating her throat. I suggested she try Zyrtec daily for a week or so to dry up the drainage. Drink plenty of fluids.  Alysia Penna, MD

## 2021-01-12 ENCOUNTER — Other Ambulatory Visit: Payer: Self-pay

## 2021-01-13 ENCOUNTER — Ambulatory Visit: Payer: BC Managed Care – PPO | Admitting: Internal Medicine

## 2021-01-13 ENCOUNTER — Encounter: Payer: Self-pay | Admitting: Internal Medicine

## 2021-01-13 VITALS — BP 130/80 | HR 96 | Temp 98.0°F | Ht 59.0 in | Wt 155.6 lb

## 2021-01-13 DIAGNOSIS — M79605 Pain in left leg: Secondary | ICD-10-CM | POA: Diagnosis not present

## 2021-01-13 DIAGNOSIS — R053 Chronic cough: Secondary | ICD-10-CM | POA: Diagnosis not present

## 2021-01-13 DIAGNOSIS — K219 Gastro-esophageal reflux disease without esophagitis: Secondary | ICD-10-CM

## 2021-01-13 DIAGNOSIS — M545 Low back pain, unspecified: Secondary | ICD-10-CM

## 2021-01-13 DIAGNOSIS — D329 Benign neoplasm of meninges, unspecified: Secondary | ICD-10-CM
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
## 2021-01-19 DIAGNOSIS — M5416 Radiculopathy, lumbar region: Secondary | ICD-10-CM | POA: Diagnosis not present

## 2021-01-19 DIAGNOSIS — M5136 Other intervertebral disc degeneration, lumbar region: Secondary | ICD-10-CM | POA: Diagnosis not present

## 2021-02-05 DIAGNOSIS — M5416 Radiculopathy, lumbar region: Secondary | ICD-10-CM | POA: Diagnosis not present

## 2021-02-25 DIAGNOSIS — M545 Low back pain, unspecified: Secondary | ICD-10-CM | POA: Diagnosis not present

## 2021-02-26 DIAGNOSIS — M5416 Radiculopathy, lumbar region: Secondary | ICD-10-CM | POA: Diagnosis not present

## 2021-03-01 DIAGNOSIS — M5416 Radiculopathy, lumbar region: Secondary | ICD-10-CM | POA: Diagnosis not present

## 2021-03-05 DIAGNOSIS — M545 Low back pain, unspecified: Secondary | ICD-10-CM | POA: Diagnosis not present

## 2021-03-08 DIAGNOSIS — M5416 Radiculopathy, lumbar region: Secondary | ICD-10-CM | POA: Diagnosis not present

## 2021-03-21 ENCOUNTER — Other Ambulatory Visit: Payer: Self-pay | Admitting: Internal Medicine

## 2021-03-24 DIAGNOSIS — M5416 Radiculopathy, lumbar region: Secondary | ICD-10-CM | POA: Diagnosis not present

## 2021-03-30 DIAGNOSIS — M5416 Radiculopathy, lumbar region: Secondary | ICD-10-CM | POA: Diagnosis not present

## 2021-04-12 ENCOUNTER — Ambulatory Visit (INDEPENDENT_AMBULATORY_CARE_PROVIDER_SITE_OTHER): Payer: BC Managed Care – PPO

## 2021-04-12 ENCOUNTER — Other Ambulatory Visit: Payer: Self-pay

## 2021-04-12 DIAGNOSIS — Z23 Encounter for immunization: Secondary | ICD-10-CM | POA: Diagnosis not present

## 2021-04-12 DIAGNOSIS — M5416 Radiculopathy, lumbar region: Secondary | ICD-10-CM | POA: Diagnosis not present

## 2021-04-21 ENCOUNTER — Ambulatory Visit: Payer: BC Managed Care – PPO | Admitting: Family Medicine

## 2021-04-21 ENCOUNTER — Other Ambulatory Visit: Payer: Self-pay

## 2021-04-21 ENCOUNTER — Encounter: Payer: Self-pay | Admitting: Family Medicine

## 2021-04-21 ENCOUNTER — Other Ambulatory Visit: Payer: Self-pay | Admitting: *Deleted

## 2021-04-21 VITALS — BP 130/80 | HR 105 | Temp 99.0°F | Ht 59.0 in | Wt 157.3 lb

## 2021-04-21 DIAGNOSIS — J069 Acute upper respiratory infection, unspecified: Secondary | ICD-10-CM | POA: Diagnosis not present

## 2021-04-21 MED ORDER — AMOXICILLIN-POT CLAVULANATE 875-125 MG PO TABS: 1.0000 | ORAL_TABLET | Freq: Two times a day (BID) | ORAL | 0 refills | Status: DC
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
## 2021-04-22 DIAGNOSIS — Z20822 Contact with and (suspected) exposure to covid-19: Secondary | ICD-10-CM | POA: Diagnosis not present

## 2021-04-23 ENCOUNTER — Telehealth: Payer: Self-pay | Admitting: Internal Medicine

## 2021-04-23 ENCOUNTER — Telehealth: Payer: BC Managed Care – PPO | Admitting: Physician Assistant

## 2021-04-23 DIAGNOSIS — U071 COVID-19: Secondary | ICD-10-CM

## 2021-04-23 MED ORDER — NIRMATRELVIR/RITONAVIR (PAXLOVID)TABLET: 3.0000 | ORAL_TABLET | Freq: Two times a day (BID) | ORAL | 0 refills | Status: AC

## 2021-04-23 MED ORDER — BENZONATATE 100 MG PO CAPS: 100.0000 mg | ORAL_CAPSULE | Freq: Three times a day (TID) | ORAL | 0 refills | Status: DC | PRN

## 2021-04-23 MED ORDER — MOLNUPIRAVIR EUA 200MG CAPSULE: 4.0000 | ORAL_CAPSULE | Freq: Two times a day (BID) | ORAL | 0 refills | Status: AC

## 2021-04-23 NOTE — Telephone Encounter (Signed)
I sent in Paxlovid for her and her husband. Drink fluids and quarantine for 5 days

## 2021-04-23 NOTE — Telephone Encounter (Signed)
Spoke with pt verbalized understanding that Dr Sarajane Jews sent in Rx to her pharmacy

## 2021-04-23 NOTE — Patient Instructions (Signed)
Hello [name],  You are being placed in the home monitoring program for COVID-19 (commonly known as Coronavirus).  This is because you are suspected to have the virus or are known to have the virus.  If you are unsure which group you fall into call your clinic.    As part of this program, you'll answer a daily questionnaire in the MyChart mobile app. You'll receive a notification through the MyChart app when the questionnaire is available. When you log in to MyChart, you'll see the tasks in your To Do activity.       Clinicians will see any answers that are concerning and take appropriate steps.  If at any point you are having a medical emergency, call 911.  If otherwise concerned call your clinic instead of coming into the clinic or hospital.  To keep from spreading the disease you should: Stay home and limit contact with other people as much as possible.  Wash your hands frequently. Cover your coughs and sneezes with a tissue, and throw used tissues in the trash.   Clean and disinfect frequently touched surfaces and objects.    Take care of yourself by: Staying home Resting Drinking fluids Take fever-reducing medications (Tylenol/Acetaminophen and Ibuprofen)  For more information on the disease go to the Centers for Disease Control and Prevention website     COVID-19: Quarantine and Isolation Quarantine If you were exposed Quarantine and stay away from others when you have been in close contact with someone who has COVID-19. Isolate If you are sick or test positive Isolate when you are sick or when you have COVID-19, even if you don't have symptoms. When to stay home Calculating quarantine The date of your exposure is considered day 0. Day 1 is the first full day after your last contact with a person who has had COVID-19. Stay home and away from other people for at least 5 days. Learn why CDC updated guidance for the general public. IF YOU were exposed to COVID-19 and are NOT  up  to dateIF YOU were exposed to COVID-19 and are NOT on COVID-19 vaccinations Quarantine for at least 5 days Stay home Stay home and quarantine for at least 5 full days. Wear a well-fitting mask if you must be around others in your home. Do not travel. Get tested Even if you don't develop symptoms, get tested at least 5 days after you last had close contact with someone with COVID-19. After quarantine Watch for symptoms Watch for symptoms until 10 days after you last had close contact with someone with COVID-19. Avoid travel It is best to avoid travel until a full 10 days after you last had close contact with someone with COVID-19. If you develop symptoms Isolate immediately and get tested. Continue to stay home until you know the results. Wear a well-fitting mask around others. Take precautions until day 10 Wear a well-fitting mask Wear a well-fitting mask for 10 full days any time you are around others inside your home or in public. Do not go to places where you are unable to wear a well-fitting mask. If you must travel during days 6-10, take precautions. Avoid being around people who are more likely to get very sick from COVID-19. IF YOU were exposed to COVID-19 and are  up to dateIF YOU were exposed to COVID-19 and are on COVID-19 vaccinations No quarantine You do not need to stay home unless you develop symptoms. Get tested Even if you don't develop symptoms, get tested at  least 5 days after you last had close contact with someone with COVID-19. Watch for symptoms Watch for symptoms until 10 days after you last had close contact with someone with COVID-19. If you develop symptoms Isolate immediately and get tested. Continue to stay home until you know the results. Wear a well-fitting mask around others. Take precautions until day 10 Wear a well-fitting mask Wear a well-fitting mask for 10 full days any time you are around others inside your home or in public. Do not go to places  where you are unable to wear a well-fitting mask. Take precautions if traveling Avoid being around people who are more likely to get very sick from COVID-19. IF YOU were exposed to COVID-19 and had confirmed COVID-19 within the past 90 days (you tested positive using a viral test) No quarantine You do not need to stay home unless you develop symptoms. Watch for symptoms Watch for symptoms until 10 days after you last had close contact with someone with COVID-19. If you develop symptoms Isolate immediately and get tested. Continue to stay home until you know the results. Wear a well-fitting mask around others. Take precautions until day 10 Wear a well-fitting mask Wear a well-fitting mask for 10 full days any time you are around others inside your home or in public. Do not go to places where you are unable to wear a well-fitting mask. Take precautions if traveling Avoid being around people who are more likely to get very sick from COVID-19. Calculating isolation Day 0 is your first day of symptoms or a positive viral test. Day 1 is the first full day after your symptoms developed or your test specimen was collected. If you have COVID-19 or have symptoms, isolate for at least 5 days. IF YOU tested positive for COVID-19 or have symptoms, regardless of vaccination status Stay home for at least 5 days Stay home for 5 days and isolate from others in your home. Wear a well-fitting mask if you must be around others in your home. Do not travel. Ending isolation if you had symptoms End isolation after 5 full days if you are fever-free for 24 hours (without the use of fever-reducing medication) and your symptoms are improving. Ending isolation if you did NOT have symptoms End isolation after at least 5 full days after your positive test. If you got very sick from COVID-19 or have a weakened immune system You should isolate for at least 10 days. Consult your doctor before ending isolation. Take  precautions until day 10 Wear a well-fitting mask Wear a well-fitting mask for 10 full days any time you are around others inside your home or in public. Do not go to places where you are unable to wear a well-fitting mask. Do not travel Do not travel until a full 10 days after your symptoms started or the date your positive test was taken if you had no symptoms. Avoid being around people who are more likely to get very sick from COVID-19. Definitions Exposure Contact with someone infected with SARS-CoV-2, the virus that causes COVID-19, in a way that increases the likelihood of getting infected with the virus. Close contact A close contact is someone who was less than 6 feet away from an infected person (laboratory-confirmed or a clinical diagnosis) for a cumulative total of 15 minutes or more over a 24-hour period. For example, three individual 5-minute exposures for a total of 15 minutes. People who are exposed to someone with COVID-19 after they completed at least  5 days of isolation are not considered close contacts. Julio Sicks is a strategy used to prevent transmission of COVID-19 by keeping people who have been in close contact with someone with COVID-19 apart from others. Who does not need to quarantine? If you had close contact with someone with COVID-19 and you are in one of the following groups, you do not need to quarantine. You are up to date with your COVID-19 vaccines. You had confirmed COVID-19 within the last 90 days (meaning you tested positive using a viral test). If you are up to date with COVID-19 vaccines, you should wear a well-fitting mask around others for 10 days from the date of your last close contact with someone with COVID-19 (the date of last close contact is considered day 0). Get tested at least 5 days after you last had close contact with someone with COVID-19. If you test positive or develop COVID-19 symptoms, isolate from other people and follow  recommendations in the Isolation section below. If you tested positive for COVID-19 with a viral test within the previous 90 days and subsequently recovered and remain without COVID-19 symptoms, you do not need to quarantine or get tested after close contact. You should wear a well-fitting mask around others for 10 days from the date of your last close contact with someone with COVID-19 (the date of last close contact is considered day 0). If you have COVID-19 symptoms, get tested and isolate from other people and follow recommendations in the Isolation section below. Who should quarantine? If you come into close contact with someone with COVID-19, you should quarantine if you are not up to date on COVID-19 vaccines. This includes people who are not vaccinated. What to do for quarantine Stay home and away from other people for at least 5 days (day 0 through day 5) after your last contact with a person who has COVID-19. The date of your exposure is considered day 0. Wear a well-fitting mask when around others at home, if possible. For 10 days after your last close contact with someone with COVID-19, watch for fever (100.34F or greater), cough, shortness of breath, or other COVID-19 symptoms. If you develop symptoms, get tested immediately and isolate until you receive your test results. If you test positive, follow isolation recommendations. If you do not develop symptoms, get tested at least 5 days after you last had close contact with someone with COVID-19. If you test negative, you can leave your home, but continue to wear a well-fitting mask when around others at home and in public until 10 days after your last close contact with someone with COVID-19. If you test positive, you should isolate for at least 5 days from the date of your positive test (if you do not have symptoms). If you do develop COVID-19 symptoms, isolate for at least 5 days from the date your symptoms began (the date the symptoms  started is day 0). Follow recommendations in the isolation section below. If you are unable to get a test 5 days after last close contact with someone with COVID-19, you can leave your home after day 5 if you have been without COVID-19 symptoms throughout the 5-day period. Wear a well-fitting mask for 10 days after your date of last close contact when around others at home and in public. Avoid people who are have weakened immune systems or are more likely to get very sick from COVID-19, and nursing homes and other high-risk settings, until after at least 10 days. If  possible, stay away from people you live with, especially people who are at higher risk for getting very sick from COVID-19, as well as others outside your home throughout the full 10 days after your last close contact with someone with COVID-19. If you are unable to quarantine, you should wear a well-fitting mask for 10 days when around others at home and in public. If you are unable to wear a mask when around others, you should continue to quarantine for 10 days. Avoid people who have weakened immune systems or are more likely to get very sick from COVID-19, and nursing homes and other high-risk settings, until after at least 10 days. See additional information about travel. Do not go to places where you are unable to wear a mask, such as restaurants and some gyms, and avoid eating around others at home and at work until after 10 days after your last close contact with someone with COVID-19. After quarantine Watch for symptoms until 10 days after your last close contact with someone with COVID-19. If you have symptoms, isolate immediately and get tested. Quarantine in high-risk congregate settings In certain congregate settings that have high risk of secondary transmission (such as Systems analyst and detention facilities, homeless shelters, or cruise ships), CDC recommends a 10-day quarantine for residents, regardless of vaccination and  booster status. During periods of critical staffing shortages, facilities may consider shortening the quarantine period for staff to ensure continuity of operations. Decisions to shorten quarantine in these settings should be made in consultation with state, local, tribal, or territorial health departments and should take into consideration the context and characteristics of the facility. CDC's setting-specific guidance provides additional recommendations for these settings. Isolation Isolation is used to separate people with confirmed or suspected COVID-19 from those without COVID-19. People who are in isolation should stay home until it's safe for them to be around others. At home, anyone sick or infected should separate from others, or wear a well-fitting mask when they need to be around others. People in isolation should stay in a specific "sick room" or area and use a separate bathroom if available. Everyone who has presumed or confirmed COVID-19 should stay home and isolate from other people for at least 5 full days (day 0 is the first day of symptoms or the date of the day of the positive viral test for asymptomatic persons). They should wear a mask when around others at home and in public for an additional 5 days. People who are confirmed to have COVID-19 or are showing symptoms of COVID-19 need to isolate regardless of their vaccination status. This includes: People who have a positive viral test for COVID-19, regardless of whether or not they have symptoms. People with symptoms of COVID-19, including people who are awaiting test results or have not been tested. People with symptoms should isolate even if they do not know if they have been in close contact with someone with COVID-19. What to do for isolation Monitor your symptoms. If you have an emergency warning sign (including trouble breathing), seek emergency medical care immediately. Stay in a separate room from other household members, if  possible. Use a separate bathroom, if possible. Take steps to improve ventilation at home, if possible. Avoid contact with other members of the household and pets. Don't share personal household items, like cups, towels, and utensils. Wear a well-fitting mask when you need to be around other people. Learn more about what to do if you are sick and how to notify your  contacts. Ending isolation for people who had COVID-19 and had symptoms If you had COVID-19 and had symptoms, isolate for at least 5 days. To calculate your 5-day isolation period, day 0 is your first day of symptoms. Day 1 is the first full day after your symptoms developed. You can leave isolation after 5 full days. You can end isolation after 5 full days if you are fever-free for 24 hours without the use of fever-reducing medication and your other symptoms have improved (Loss of taste and smell may persist for weeks or months after recovery and need not delay the end of isolation). You should continue to wear a well-fitting mask around others at home and in public for 5 additional days (day 6 through day 10) after the end of your 5-day isolation period. If you are unable to wear a mask when around others, you should continue to isolate for a full 10 days. Avoid people who have weakened immune systems or are more likely to get very sick from COVID-19, and nursing homes and other high-risk settings, until after at least 10 days. If you continue to have fever or your other symptoms have not improved after 5 days of isolation, you should wait to end your isolation until you are fever-free for 24 hours without the use of fever-reducing medication and your other symptoms have improved. Continue to wear a well-fitting mask through day 10. Contact your healthcare provider if you have questions. See additional information about travel. Do not go to places where you are unable to wear a mask, such as restaurants and some gyms, and avoid eating  around others at home and at work until a full 10 days after your first day of symptoms. If an individual has access to a test and wants to test, the best approach is to use an antigen test1 towards the end of the 5-day isolation period. Collect the test sample only if you are fever-free for 24 hours without the use of fever-reducing medication and your other symptoms have improved (loss of taste and smell may persist for weeks or months after recovery and need not delay the end of isolation). If your test result is positive, you should continue to isolate until day 10. If your test result is negative, you can end isolation, but continue to wear a well-fitting mask around others at home and in public until day 10. Follow additional recommendations for masking and avoiding travel as described above. 1As noted in the labeling for authorized over-the counter antigen tests: Negative results should be treated as presumptive. Negative results do not rule out SARS-CoV-2 infection and should not be used as the sole basis for treatment or patient management decisions, including infection control decisions. To improve results, antigen tests should be used twice over a three-day period with at least 24 hours and no more than 48 hours between tests. Note that these recommendations on ending isolation do not apply to people who are moderately ill or very sick from COVID-19 or have weakened immune systems. See section below for recommendations for when to end isolation for these groups. Ending isolation for people who tested positive for COVID-19 but had no symptoms If you test positive for COVID-19 and never develop symptoms, isolate for at least 5 days. Day 0 is the day of your positive viral test (based on the date you were tested) and day 1 is the first full day after the specimen was collected for your positive test. You can leave isolation after 5  full days. If you continue to have no symptoms, you can end isolation  after at least 5 days. You should continue to wear a well-fitting mask around others at home and in public until day 10 (day 6 through day 10). If you are unable to wear a mask when around others, you should continue to isolate for 10 days. Avoid people who have weakened immune systems or are more likely to get very sick from COVID-19, and nursing homes and other high-risk settings, until after at least 10 days. If you develop symptoms after testing positive, your 5-day isolation period should start over. Day 0 is your first day of symptoms. Follow the recommendations above for ending isolation for people who had COVID-19 and had symptoms. See additional information about travel. Do not go to places where you are unable to wear a mask, such as restaurants and some gyms, and avoid eating around others at home and at work until 10 days after the day of your positive test. If an individual has access to a test and wants to test, the best approach is to use an antigen test1 towards the end of the 5-day isolation period. If your test result is positive, you should continue to isolate until day 10. If your test result is positive, you can also choose to test daily and if your test result is negative, you can end isolation, but continue to wear a well-fitting mask around others at home and in public until day 10. Follow additional recommendations for masking and avoiding travel as described above. 1As noted in the labeling for authorized over-the counter antigen tests: Negative results should be treated as presumptive. Negative results do not rule out SARS-CoV-2 infection and should not be used as the sole basis for treatment or patient management decisions, including infection control decisions. To improve results, antigen tests should be used twice over a three-day period with at least 24 hours and no more than 48 hours between tests. Ending isolation for people who were moderately or very sick from COVID-19 or  have a weakened immune system People who are moderately ill from COVID-19 (experiencing symptoms that affect the lungs like shortness of breath or difficulty breathing) should isolate for 10 days and follow all other isolation precautions. To calculate your 10-day isolation period, day 0 is your first day of symptoms. Day 1 is the first full day after your symptoms developed. If you are unsure if your symptoms are moderate, talk to a healthcare provider for further guidance. People who are very sick from COVID-19 (this means people who were hospitalized or required intensive care or ventilation support) and people who have weakened immune systems might need to isolate at home longer. They may also require testing with a viral test to determine when they can be around others. CDC recommends an isolation period of at least 10 and up to 20 days for people who were very sick from COVID-19 and for people with weakened immune systems. Consult with your healthcare provider about when you can resume being around other people. If you are unsure if your symptoms are severe or if you have a weakened immune system, talk to a healthcare provider for further guidance. People who have a weakened immune system should talk to their healthcare provider about the potential for reduced immune responses to COVID-19 vaccines and the need to continue to follow current prevention measures (including wearing a well-fitting mask and avoiding crowds and poorly ventilated indoor spaces) to protect themselves against COVID-19  until advised otherwise by their healthcare provider. Close contacts of immunocompromised people--including household members--should also be encouraged to receive all recommended COVID-19 vaccine doses to help protect these people. Isolation in high-risk congregate settings In certain high-risk congregate settings that have high risk of secondary transmission and where it is not feasible to cohort people (such as  Systems analyst and detention facilities, homeless shelters, and cruise ships), CDC recommends a 10-day isolation period for residents. During periods of critical staffing shortages, facilities may consider shortening the isolation period for staff to ensure continuity of operations. Decisions to shorten isolation in these settings should be made in consultation with state, local, tribal, or territorial health departments and should take into consideration the context and characteristics of the facility. CDC's setting-specific guidance provides additional recommendations for these settings. This CDC guidance is meant to supplement--not replace--any federal, state, local, territorial, or tribal health and safety laws, rules, and regulations. Recommendations for specific settings These recommendations do not apply to healthcare professionals. For guidance specific to these settings, see Healthcare professionals: Interim Guidance for Optician, dispensing with SARS-CoV-2 Infection or Exposure to SARS-CoV-2 Patients, residents, and visitors to healthcare settings: Interim Infection Prevention and Control Recommendations for Healthcare Personnel During the [place] 2019 (COVID-19) Pandemic Additional setting-specific guidance and recommendations are available. These recommendations on quarantine and isolation do apply to [place] settings. Additional guidance is available here: Overview of COVID-19 Quarantine for K-12 Schools Travelers: Travel information and recommendations Congregate facilities and other settings: [employer] for community, work, and school settings Ongoing COVID-19 exposure FAQs I live with someone with COVID-19, but I cannot be separated from them. How do we manage quarantine in this situation? It is very important for people with COVID-19 to remain apart from other people, if possible, even if they are living together. If separation of the person with COVID-19 from  others that they live with is not possible, the other people that they live with will have ongoing exposure, meaning they will be repeatedly exposed until that person is no longer able to spread the virus to other people. In this situation, there are precautions you can take to limit the spread of COVID-19: The person with COVID-19 and everyone they live with should wear a well-fitting mask inside the home. If possible, one person should care for the person with COVID-19 to limit the number of people who are in close contact with the infected person. Take steps to protect yourself and others to reduce transmission in the home: Quarantine if you are not up to date with your COVID-19 vaccines. Isolate if you are sick or tested positive for COVID-19, even if you don't have symptoms. Learn more about the public health recommendations for testing, mask use and quarantine of close contacts, like yourself, who have ongoing exposure. These recommendations differ depending on your vaccination status. What should I do if I have ongoing exposure to COVID-19 from someone I live with? Recommendations for this situation depend on your vaccination status: If you are not up to date on COVID-19 vaccines and have ongoing exposure to COVID-19, you should: Begin quarantine immediately and continue to quarantine throughout the isolation period of the person with COVID-19. Continue to quarantine for an additional 5 days starting the day after the end of isolation for the person with COVID-19. Get tested at least 5 days after the end of isolation of the infected person that lives with them. If you test negative, you can leave the home but should continue to wear  a well-fitting mask when around others at home and in public until 10 days after the end of isolation for the person with COVID-19. Isolate immediately if you develop symptoms of COVID-19 or test positive. If you are up to date with COVID-19 vaccines and have  ongoing exposure to COVID-19, you should: Get tested at least 5 days after your first exposure. A person with COVID-19 is considered infectious starting 2 days before they develop symptoms, or 2 days before the date of their positive test if they do not have symptoms. Get tested again at least 5 days after the end of isolation for the person with COVID-19. Wear a well-fitting mask when you are around the person with COVID-19, and do this throughout their isolation period. Wear a well-fitting mask around others for 10 days after the infected person's isolation period ends. Isolate immediately if you develop symptoms of COVID-19 or test positive. What should I do if multiple people I live with test positive for COVID-19 at different times? Recommendations for this situation depend on your vaccination status: If you are not up to date with your COVID-19 vaccines, you should: Quarantine throughout the isolation period of any infected person that you live with. Continue to quarantine until 5 days after the end of isolation date for the most recently infected person that lives with you. For example, if the last day of isolation of the person most recently infected with COVID-19 was June 30, the new 5-day quarantine period starts on July 1. Get tested at least 5 days after the end of isolation for the most recently infected person that lives with you. Wear a well-fitting mask when you are around any person with COVID-19 while that person is in isolation. Wear a well-fitting mask when you are around other people until 10 days after your last close contact. Isolate immediately if you develop symptoms of COVID-19 or test positive. If you are up to date with your COVID-19 vaccines, you should: Get tested at least 5 days after your first exposure. A person with COVID-19 is considered infectious starting 2 days before they developed symptoms, or 2 days before the date of their positive test if they do not have  symptoms. Get tested again at least 5 days after the end of isolation for the most recently infected person that lives with you. Wear a well-fitting mask when you are around any person with COVID-19 while that person is in isolation. Wear a well-fitting mask around others for 10 days after the end of isolation for the most recently infected person that lives with you. For example, if the last day of isolation for the person most recently infected with COVID-19 was June 30, the new 10-day period to wear a well-fitting mask indoors in public starts on July 1. Isolate immediately if you develop symptoms of COVID-19 or test positive. I had COVID-19 and completed isolation. Do I have to quarantine or get tested if someone I live with gets COVID-19 shortly after I completed isolation? No. If you recently completed isolation and someone that lives with you tests positive for the virus that causes COVID-19 shortly after the end of your isolation period, you do not have to quarantine or get tested as long as you do not develop new symptoms. Once all of the people that live together have completed isolation or quarantine, refer to the guidance below for new exposures to COVID-19. If you had COVID-19 in the previous 90 days and then came into close contact with  someone with COVID-19, you do not have to quarantine or get tested if you do not have symptoms. But you should: Wear a well-fitting mask indoors in public for 10 days after your last close contact. Monitor for COVID-19 symptoms for 10 days from the date of your last close contact. Isolate immediately and get tested if symptoms develop. If more than 90 days have passed since your recovery from infection, follow CDC's recommendations for close contacts. These recommendations will differ depending on your vaccination status. 10/28/2020 Content source: [facility] ([facility]), Division of Viral Diseases This  information is not intended to replace advice given to you by your health care provider. Make sure you discuss any questions you have with your health care provider. Document Revised: 12/03/2020 Document Reviewed: 12/03/2020 Elsevier Patient Education  2022 [place] are being prescribed MOLNUPIRAVIR for COVID-19 infection.   Please call the pharmacy or go through the drive through vs going inside if you are picking up the mediation yourself to prevent further spread. If prescribed to a [facility] affiliated pharmacy, a pharmacist will bring the medication out to your car.   ADMINISTRATION INSTRUCTIONS: Take with or without food. Swallow the tablets whole. Don't chew, crush, or break the medications because it might not work as well  For each dose of the medication, you should be taking FOUR tablets at one time, TWICE a day   Finish your full five-day course of Molnupiravir even if you feel better before you're done. Stopping this medication too early can make it less effective to prevent severe illness related to [place].    Molnupiravir is prescribed for YOU ONLY. Don't share it with others, even if they have similar symptoms as you. This medication might not be right for everyone.   Make sure to take steps to protect yourself and others while you're taking this medication in order to get well soon and to prevent others from getting sick with COVID-19.   **If you are of childbearing potential (any gender) - it is advised to not get pregnant while taking this medication and recommended that condoms are used for female partners the next 3 months after taking the medication out of extreme caution    COMMON SIDE EFFECTS: Diarrhea Nausea  Dizziness    If your COVID-19 symptoms get worse, get medical help right away. Call 911 if you experience symptoms such as worsening cough, trouble breathing, chest pain that doesn't go away, confusion, a hard time staying awake, and pale or  blue-colored skin. This medication won't prevent all COVID-19 cases from getting worse.  Can take to lessen severity: Vit C 500mg  twice daily Quercertin 250-500mg  twice daily Zinc 75-100mg  daily Melatonin 3-6 mg at bedtime Vit D3 1000-2000 IU daily Aspirin 81 mg daily with food Optional: Famotidine 20mg  daily Also can add tylenol/ibuprofen as needed for fevers and body aches May add Mucinex or Mucinex DM as needed for cough/congestion  10 Things You Can Do to Manage Your COVID-19 Symptoms at Home If you have possible or confirmed COVID-19 Stay home except to get medical care. Monitor your symptoms carefully. If your symptoms get worse, call your healthcare provider immediately. Get rest and stay hydrated. If you have a medical appointment, call the healthcare provider ahead of time and tell them that you have or may have COVID-19. For medical emergencies, call 911 and notify the dispatch personnel that you have or may have COVID-19. Cover your cough and sneezes with a  tissue or use the inside of your elbow. Wash your hands often with soap and water for at least 20 seconds or clean your hands with an alcohol-based hand sanitizer that contains at least 60% alcohol. As much as possible, stay in a specific room and away from other people in your home. Also, you should use a separate bathroom, if available. If you need to be around other people in or outside of the home, wear a mask. Avoid sharing personal items with other people in your household, like dishes, towels, and bedding. Clean all surfaces that are touched often, like counters, tabletops, and doorknobs. Use household cleaning sprays or wipes according to the label instructions. [email or web address] 02/14/2020 This information is not intended to replace advice given to you by your health care provider. Make sure you discuss any questions you have with your health care provider. Document Revised: 12/03/2020 Document Reviewed:  12/03/2020 Elsevier Patient Education  [place].

## 2021-04-23 NOTE — Progress Notes (Signed)
Virtual Visit Consent   [name], you are scheduled for a virtual visit with a [facility] provider today.     Just as with appointments in the office, your consent must be obtained to participate.  Your consent will be active for this visit and any virtual visit you may have with one of our providers in the next 365 days.     If you have a MyChart account, a copy of this consent can be sent to you electronically.  All virtual visits are billed to your insurance company just like a traditional visit in the office.    As this is a virtual visit, video technology does not allow for your provider to perform a traditional examination.  This may limit your provider's ability to fully assess your condition.  If your provider identifies any concerns that need to be evaluated in person or the need to arrange testing (such as labs, EKG, etc.), we will make arrangements to do so.     Although advances in technology are sophisticated, we cannot ensure that it will always work on either your end or our end.  If the connection with a video visit is poor, the visit may have to be switched to a telephone visit.  With either a video or telephone visit, we are not always able to ensure that we have a secure connection.     I need to obtain your verbal consent now.   Are you willing to proceed with your visit today?    Tessi Eustache has provided verbal consent on 04/23/2021 for a virtual visit (video or telephone).   Mar Daring, PA-C   Date: 04/23/2021 3:09 PM   Virtual Visit via Video Note   IMar Daring, connected with  [name]  ([id number], [birth year]) on 04/23/21 at  3:45 PM EDT by a video-enabled telemedicine application and verified that I am speaking with the correct person using two identifiers.  Location: Patient: Virtual Visit Location Patient: Home Provider: Virtual Visit Location Provider: Home Office   I discussed the limitations of evaluation and management  by telemedicine and the availability of in person appointments. The patient expressed understanding and agreed to proceed.    History of Present Illness: [name] is a 60 y.o. who identifies as a female who was assigned female at birth, and is being seen today for Covid 11.  HPI: URI  This is a new problem. The current episode started in the past 7 days (symptoms monday evening; tested positive for covid 19 yesterday). There has been no fever (99). Associated symptoms include congestion, coughing, headaches, rhinorrhea, sinus pain and a sore throat. Pertinent negatives include no diarrhea, nausea or vomiting. Associated symptoms comments: Hoarse voice . She has tried acetaminophen, increased fluids and sleep (robitussin every 4 hours, tylenol) for the symptoms. The treatment provided no relief.     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  Able to  take aleve products    Medications:  Current Outpatient Medications:    albuterol (VENTOLIN HFA) 108 (90 Base) MCG/ACT inhaler, Inhale 2 puffs into the lungs every 4 (four) hours as needed for wheezing or shortness of breath., Disp: 18 g, Rfl: 0   amoxicillin-clavulanate (AUGMENTIN) 875-125 MG tablet, Take 1 tablet by mouth 2 (two) times daily., Disp: 20 tablet, Rfl: 0   B Complex Vitamins (B COMPLEX 1 PO), Take by mouth., Disp: , Rfl:    budesonide-formoterol (SYMBICORT)  160-4.5 MCG/ACT inhaler, Inhale 2 puffs into the lungs 2 (two) times daily., Disp: , Rfl:    Calcium Carbonate-Vit D-Min (CALTRATE PLUS PO), Take 1 tablet by mouth daily., Disp: , Rfl:    Cholecalciferol (VITAMIN D HIGH POTENCY PO), Take by mouth. 5000 units per day, Disp: , Rfl:    losartan (COZAAR) 50 MG tablet, TAKE ONE TABLET BY MOUTH ONE TIME DAILY, Disp: 90 tablet, Rfl: 0   omeprazole (PRILOSEC) 20 MG capsule, Take 1 capsule (20 mg total) by mouth daily. (Patient taking differently: Take 20 mg by mouth as needed.), Disp: 90 capsule, Rfl: 1  Current Facility-Administered Medications:    0.9 %  sodium chloride infusion, 500 mL, Intravenous, Once, Armbruster, Carlota Raspberry, MD  Observations/Objective: Patient is well-developed, well-nourished in no acute distress.  Resting comfortably at home.  Head is normocephalic, atraumatic.  No labored breathing.  Speech is clear and coherent with logical content.  Patient is alert and oriented at baseline.    Assessment and Plan: There are no diagnoses linked to this encounter. - Continue OTC symptomatic management of choice - Will send OTC vitamins and supplement information through AVS - Molnupiravir prescribed - Tessalon perles for cough - Patient enrolled in MyChart symptom monitoring - Push fluids - Rest as needed - Discussed return precautions and when to seek in-person evaluation, sent via AVS as well  Follow Up Instructions: I discussed the assessment and treatment plan with the patient. The patient was provided an opportunity to ask questions and all were answered. The patient agreed with the plan and demonstrated an understanding of the instructions.  A copy of instructions were sent to the patient via MyChart unless otherwise noted below.    The patient was advised to call back or seek an in-person evaluation if the symptoms worsen or if the condition fails to improve as anticipated.  Time:  I spent 15 minutes with the patient via  telehealth technology discussing the above problems/concerns.    Mar Daring, PA-C

## 2021-04-23 NOTE — Telephone Encounter (Signed)
PT called to state they tested positive for covid after having seen Dr.Burchette and want to find out if there is any anti-viral they can take as well as advise on what to do. Please advise as they like a phone call back asap today.

## 2021-04-23 NOTE — Addendum Note (Signed)
Addended by: Alysia Penna A on: 04/23/2021 04:12 PM   Modules accepted: Orders

## 2021-04-25 ENCOUNTER — Telehealth: Payer: BC Managed Care – PPO | Admitting: Emergency Medicine

## 2021-04-25 DIAGNOSIS — U071 COVID-19: Secondary | ICD-10-CM

## 2021-04-25 NOTE — Patient Instructions (Signed)
[name], thank you for joining Carvel Getting, NP for today's virtual visit.  While this provider is not your primary care provider (PCP), if your PCP is located in our provider database this encounter information will be shared with them immediately following your visit.  Consent: (Patient) [name] provided verbal consent for this virtual visit at the beginning of the encounter.  Current Medications:  Current Outpatient Medications:    albuterol (VENTOLIN HFA) 108 (90 Base) MCG/ACT inhaler, Inhale 2 puffs into the lungs every 4 (four) hours as needed for wheezing or shortness of breath., Disp: 18 g, Rfl: 0   amoxicillin-clavulanate (AUGMENTIN) 875-125 MG tablet, Take 1 tablet by mouth 2 (two) times daily., Disp: 20 tablet, Rfl: 0   B Complex Vitamins (B COMPLEX 1 PO), Take by mouth., Disp: , Rfl:    benzonatate (TESSALON) 100 MG capsule, Take 1 capsule (100 mg total) by mouth 3 (three) times daily as needed., Disp: 30 capsule, Rfl: 0   budesonide-formoterol (SYMBICORT) 160-4.5 MCG/ACT inhaler, Inhale 2 puffs into the lungs 2 (two) times daily., Disp: , Rfl:    Calcium Carbonate-Vit D-Min (CALTRATE PLUS PO), Take 1 tablet by mouth daily., Disp: , Rfl:    Cholecalciferol (VITAMIN D HIGH POTENCY PO), Take by mouth. 5000 units per day, Disp: , Rfl:    losartan (COZAAR) 50 MG tablet, TAKE ONE TABLET BY MOUTH ONE TIME DAILY, Disp: 90 tablet, Rfl: 0   molnupiravir EUA (LAGEVRIO) 200 mg CAPS capsule, Take 4 capsules (800 mg total) by mouth 2 (two) times daily for 5 days., Disp: 40 capsule, Rfl: 0   nirmatrelvir/ritonavir EUA (PAXLOVID) 20 x 150 MG & 10 x 100MG  TABS, Take 3 tablets by mouth 2 (two) times daily for 5 days. (Take nirmatrelvir 150 mg two tablets twice daily for 5 days and ritonavir 100 mg one tablet twice daily for 5 days) Patient GFR is 98, Disp: 30 tablet, Rfl: 0   omeprazole (PRILOSEC) 20 MG capsule, Take 1 capsule (20 mg total) by mouth daily. (Patient taking  differently: Take 20 mg by mouth as needed.), Disp: 90 capsule, Rfl: 1  Current Facility-Administered Medications:    0.9 %  sodium chloride infusion, 500 mL, Intravenous, Once, Armbruster, Julia Raspberry, MD   Medications ordered in this encounter:  No orders of the defined types were placed in this encounter.    *If you need refills on other medications prior to your next appointment, please contact your pharmacy*  Follow-Up: Call back or seek an in-person evaluation if the symptoms worsen or if the condition fails to improve as anticipated.  Other Instructions You may use your albuterol inhaler 2 puffs every 4 to 6 hours if needed for shortness of breath or wheezing. Wait a minute in between puffs.   Add saline nasal spray. Continue using Robitussin DM. Drink lots of fluids. Rest when you need to. Finish the prescription of Molnupiravir per package instructions.    If you have been instructed to have an in-person evaluation today at a local Urgent Care facility, please use the link below. It will take you to a list of all of our available [facility], including address, phone number and hours of operation. Please do not delay care.  [facility]  If you or a family member do not have a primary care provider, use the link below to schedule a visit and establish care. When you choose a [facility] primary care physician or advanced practice provider, you  gain a long-term partner in health. Find a Primary Care Provider  Learn more about [facility]'s in-office and virtual care options: [facility]

## 2021-04-25 NOTE — Progress Notes (Signed)
Virtual Visit Consent   [name], you are scheduled for a virtual visit with a [facility] provider today.     Just as with appointments in the office, your consent must be obtained to participate.  Your consent will be active for this visit and any virtual visit you may have with one of our providers in the next 365 days.     If you have a MyChart account, a copy of this consent can be sent to you electronically.  All virtual visits are billed to your insurance company just like a traditional visit in the office.    As this is a virtual visit, video technology does not allow for your provider to perform a traditional examination.  This may limit your provider's ability to fully assess your condition.  If your provider identifies any concerns that need to be evaluated in person or the need to arrange testing (such as labs, EKG, etc.), we will make arrangements to do so.     Although advances in technology are sophisticated, we cannot ensure that it will always work on either your end or our end.  If the connection with a video visit is poor, the visit may have to be switched to a telephone visit.  With either a video or telephone visit, we are not always able to ensure that we have a secure connection.     I need to obtain your verbal consent now.   Are you willing to proceed with your visit today?    [name] has provided verbal consent on 04/25/2021 for a virtual visit (video or telephone).   Carvel Getting, NP   Date: 04/25/2021 10:41 AM   Virtual Visit via Video Note   I, Carvel Getting, connected with  [name]  ([id number], 1960/08/12) on 04/25/21 at 10:30 AM EDT by a video-enabled telemedicine application and verified that I am speaking with the correct person using two identifiers.  Location: Patient: Virtual Visit Location Patient: Home Provider: Virtual Visit Location Provider: Home Office   I discussed the limitations of evaluation and management by  telemedicine and the availability of in person appointments. The patient expressed understanding and agreed to proceed.    History of Present Illness: [name] is a 60 y.o. who identifies as a female who was assigned female at birth, and is being seen today for Covid. She tested positive on 04/23/21, was seen by virtual visit and prescribed molnupiravir and benzonatate. Already had albuterol for prior problem and was told she could use it if needed with COVID. Has not been using albuterol because she is not sure how much/how often she should use it. Denies SOB. Feeling better: sore throat has resolved, now biggest concerns are cough and congestion. Is using Robitussin DM which does help a little.   HPI: HPI  Problems:  Patient Active Problem List   Diagnosis Date Noted   Cough variant asthma vs uacs  12/19/2017   Osteopenia 03/29/2016   Eczematous dermatitis 02/28/2014   Rhinitis, allergic 06/25/2012   Snoring 12/14/2011   Heart murmur previously undiagnosed 12/14/2011   Fatigue 12/14/2011   Menopausal state 07/11/2011   History of hyperprolactinemia 07/11/2011   BULLOUS MYRINGITIS 07/27/2010   MAGNETIC RESONANCE IMAGING, BRAIN, ABNORMAL 05/05/2008   ELEVATED BLOOD PRESSURE WITHOUT DIAGNOSIS OF HYPERTENSION 05/05/2008    Allergies:  Allergies  Allergen Reactions   Aspirin     Unknown childhood reaction.  Able to take aleve products    Medications:  Current  Outpatient Medications:    albuterol (VENTOLIN HFA) 108 (90 Base) MCG/ACT inhaler, Inhale 2 puffs into the lungs every 4 (four) hours as needed for wheezing or shortness of breath., Disp: 18 g, Rfl: 0   amoxicillin-clavulanate (AUGMENTIN) 875-125 MG tablet, Take 1 tablet by mouth 2 (two) times daily., Disp: 20 tablet, Rfl: 0   B Complex Vitamins (B COMPLEX 1 PO), Take by mouth., Disp: , Rfl:    benzonatate (TESSALON) 100 MG capsule, Take 1 capsule (100 mg total) by mouth 3 (three) times daily as needed., Disp: 30 capsule,  Rfl: 0   budesonide-formoterol (SYMBICORT) 160-4.5 MCG/ACT inhaler, Inhale 2 puffs into the lungs 2 (two) times daily., Disp: , Rfl:    Calcium Carbonate-Vit D-Min (CALTRATE PLUS PO), Take 1 tablet by mouth daily., Disp: , Rfl:    Cholecalciferol (VITAMIN D HIGH POTENCY PO), Take by mouth. 5000 units per day, Disp: , Rfl:    losartan (COZAAR) 50 MG tablet, TAKE ONE TABLET BY MOUTH ONE TIME DAILY, Disp: 90 tablet, Rfl: 0   molnupiravir EUA (LAGEVRIO) 200 mg CAPS capsule, Take 4 capsules (800 mg total) by mouth 2 (two) times daily for 5 days., Disp: 40 capsule, Rfl: 0   nirmatrelvir/ritonavir EUA (PAXLOVID) 20 x 150 MG & 10 x 100MG  TABS, Take 3 tablets by mouth 2 (two) times daily for 5 days. (Take nirmatrelvir 150 mg two tablets twice daily for 5 days and ritonavir 100 mg one tablet twice daily for 5 days) Patient GFR is 98, Disp: 30 tablet, Rfl: 0   omeprazole (PRILOSEC) 20 MG capsule, Take 1 capsule (20 mg total) by mouth daily. (Patient taking differently: Take 20 mg by mouth as needed.), Disp: 90 capsule, Rfl: 1  Current Facility-Administered Medications:    0.9 %  sodium chloride infusion, 500 mL, Intravenous, Once, Armbruster, Carlota Raspberry, MD  Observations/Objective: Patient is well-developed, well-nourished in no acute distress.  Resting comfortably  at home.  Head is normocephalic, atraumatic.  No labored breathing.  Speech is clear and coherent with logical content.  Patient is alert and oriented at baseline.    Assessment and Plan: 1. COVID-19  Reviewed use of albuterol: 2 puffs q4-6 hours prn wheezing, SOB. Suggested she add saline nasal spray prn, drink lots of fluids, res.   Follow Up Instructions: I discussed the assessment and treatment plan with the patient. The patient was provided an opportunity to ask questions and all were answered. The patient agreed with the plan and demonstrated an understanding of the instructions.  A copy of instructions were sent to the patient via  MyChart unless otherwise noted below.     The patient was advised to call back or seek an in-person evaluation if the symptoms worsen or if the condition fails to improve as anticipated.  Time:  I spent 10 minutes with the patient via telehealth technology discussing the above problems/concerns.    Carvel Getting, NP

## 2021-05-04 ENCOUNTER — Telehealth (INDEPENDENT_AMBULATORY_CARE_PROVIDER_SITE_OTHER): Payer: BC Managed Care – PPO | Admitting: Family Medicine

## 2021-05-04 ENCOUNTER — Encounter: Payer: Self-pay | Admitting: Family Medicine

## 2021-05-04 VITALS — Temp 96.9°F | Wt 154.0 lb

## 2021-05-04 DIAGNOSIS — U071 COVID-19: Secondary | ICD-10-CM | POA: Diagnosis not present

## 2021-05-04 MED ORDER — BENZONATATE 100 MG PO CAPS: 100.0000 mg | ORAL_CAPSULE | Freq: Three times a day (TID) | ORAL | 0 refills | Status: DC | PRN

## 2021-05-04 NOTE — Patient Instructions (Signed)
-  I sent the medication(s) we discussed to your pharmacy: Meds ordered this encounter  Medications   benzonatate (TESSALON PERLES) 100 MG capsule    Sig: Take 1 capsule (100 mg total) by mouth 3 (three) times daily as needed.    Dispense:  20 capsule    Refill:  0     I hope you are ALL feeling better soon!  Seek in person care promptly if your symptoms worsen, new concerns arise or you are not improving with treatment.  It was nice to meet you today. I help [facility] out with telemedicine visits on Tuesdays and Thursdays and am available for visits on those days. If you have any concerns or questions following this visit please schedule a follow up visit with your Primary Care doctor or seek care at a local urgent care clinic to avoid delays in care.
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
## 2021-05-11 DIAGNOSIS — M545 Low back pain, unspecified: Secondary | ICD-10-CM | POA: Diagnosis not present

## 2021-05-13 ENCOUNTER — Encounter: Payer: Self-pay | Admitting: Adult Health

## 2021-05-13 ENCOUNTER — Ambulatory Visit (INDEPENDENT_AMBULATORY_CARE_PROVIDER_SITE_OTHER): Payer: BC Managed Care – PPO

## 2021-05-13 ENCOUNTER — Other Ambulatory Visit: Payer: Self-pay

## 2021-05-13 ENCOUNTER — Ambulatory Visit: Payer: BC Managed Care – PPO | Admitting: Adult Health

## 2021-05-13 VITALS — BP 140/82 | HR 90 | Temp 98.6°F | Ht 59.0 in | Wt 156.0 lb

## 2021-05-13 DIAGNOSIS — R058 Other specified cough: Secondary | ICD-10-CM

## 2021-05-13 DIAGNOSIS — R059 Cough, unspecified: Secondary | ICD-10-CM | POA: Diagnosis not present

## 2021-05-13 MED ORDER — PREDNISONE 10 MG PO TABS: ORAL_TABLET | ORAL | 0 refills | Status: DC
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
## 2021-05-16 ENCOUNTER — Other Ambulatory Visit: Payer: Self-pay | Admitting: Gastroenterology

## 2021-05-24 ENCOUNTER — Ambulatory Visit: Payer: BC Managed Care – PPO | Admitting: Internal Medicine

## 2021-05-25 ENCOUNTER — Telehealth: Payer: Self-pay

## 2021-05-25 NOTE — Telephone Encounter (Signed)
Please call in Doxycycline 100 mg to take daily while travelling and then daily for 28 days after returning home, #[phone] with no rf

## 2021-05-25 NOTE — Telephone Encounter (Signed)
Caller states that on 04/19/21 she tested positive for covid. She has been taking medication for the continued wheezing and coughing, she was seen recently for this symptom and was put on prednisone which helped, she has finished the medication (Wednesday or thursday of last week) and the wheezing has come back. She is currently out of town at this time.  Disp. Time Eilene Ghazi Time) Disposition Final User 05/24/2021 2:23:27 PM Send to Urgent Ala Dach 05/24/2021 2:37:41 PM Call PCP Now Yes Susy Manor, RN, Megan Caller Disagree/Comply Disagree Caller Understands Yes PreDisposition Call Doctor  User: Sula Soda, RN Date/Time Eilene Ghazi Time): 05/24/2021 2:38:54 PM I called the office backline. I was told Tommi Rumps is not in the office today and no medications would be able to be prescribed. Since patient is out of town, she would need to get evaluated at an UC. Patient refusing to go to UC. She does have pulmonologist appt on Friday.

## 2021-05-26 MED ORDER — DOXYCYCLINE HYCLATE 100 MG PO TABS: ORAL_TABLET | ORAL | 0 refills | Status: DC

## 2021-05-28 ENCOUNTER — Ambulatory Visit: Payer: BC Managed Care – PPO | Admitting: Internal Medicine

## 2021-05-28 ENCOUNTER — Encounter: Payer: Self-pay | Admitting: Internal Medicine

## 2021-05-28 ENCOUNTER — Other Ambulatory Visit: Payer: Self-pay

## 2021-05-28 DIAGNOSIS — J45991 Cough variant asthma: Secondary | ICD-10-CM | POA: Diagnosis not present

## 2021-05-28 MED ORDER — PANTOPRAZOLE SODIUM 40 MG PO TBEC: 40.0000 mg | DELAYED_RELEASE_TABLET | Freq: Every day | ORAL | 2 refills | Status: DC

## 2021-05-28 MED ORDER — TRAMADOL HCL 50 MG PO TABS
50.0000 mg | ORAL_TABLET | ORAL | 0 refills | Status: AC | PRN
Start: 2021-05-28 — End: 2021-06-02

## 2021-05-28 MED ORDER — PREDNISONE 10 MG PO TABS: ORAL_TABLET | ORAL | 0 refills | Status: DC

## 2021-05-28 MED ORDER — FAMOTIDINE 20 MG PO TABS: ORAL_TABLET | ORAL | 11 refills | Status: DC

## 2021-05-28 MED ORDER — BUDESONIDE-FORMOTEROL FUMARATE 80-4.5 MCG/ACT IN AERO: INHALATION_SPRAY | RESPIRATORY_TRACT | 12 refills | Status: DC

## 2021-05-28 NOTE — Progress Notes (Signed)
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 Regis Bill. Pt c/o SOB for the past 2 months. She gets winded if she talks to much. She also gets SOB with walking but unable to say how far she walks before she notices a problem.   cough came on abruptly and of March 2018 with rhinitis/nasal discharge/ some sneezing with subjective wheezing   rx pred/delsym/ proair (not very helpful) changed to symb/singulair and gradually improved but still harsh coughing fits sporadic pattern        Kouffman Reflux v Neurogenic Cough Differentiator Reflux Comments  Do you awaken from a sound sleep coughing violently?                            With trouble breathing? Yes   Do you have choking episodes when you cannot  Get enough air, gasping for air ?              NO    Do you usually cough when you lie down into  The bed, or when you just lie down to rest ?                          Not really    Do you usually cough after meals or eating?         Worse    Do you cough when (or after) you bend over?    No    GERD SCORE     Kouffman Reflux v Neurogenic Cough Differentiator Neurogenic   Do you more-or-less cough all day long? Sporadic    Does change of temperature make you cough? no   Does laughing or chuckling cause you to cough? no   Do fumes (perfume, automobile fumes, burned  Toast, etc.,) cause you to cough ?       no   Does speaking, singing, or talking on the phone cause you to cough   ?               No    Neurogenic/Airway score         Unless coughing not sob  Rec symbicort change to 80 strength and take up to 2 every 12 hours if it's helping  Work on inhaler technique:   First take delsym two tsp every 12 hours and supplement if needed with  tramadol 50 mg up to 1 every 4 hours to suppress the urge to cough at all or even clear your throat.  Prednisone 10 mg take  4 each am x 2 days,   2 each am x 2 days,  1 each am x 2 days and stop (this is to eliminate allergies and inflammation from coughing) Protonix (pantoprazole) Take  30-60 min before first meal of the day and Pepcid 20 mg one bedtime plus chlorpheniramine 4 mg x 2 at bedtime (both available over the counter)  until cough is completely gone for at least a week without the need for cough suppression GERD diet reviewed, bed blocks       05/28/2021 Re-establish /[name] re: daily throat clearing 2014 never completely resolved then recurrent severe cough flare p covid Mid Sep 2022 maint on prilosec 20 mg one before supper   Chief Complaint  Patient presents with   Pulmonary Consult    04/19/21 dx with covid- sore throat and cough- tx with antiviral. Cough has lingered since then and now wheezing- pred helps but symptoms return one she completes med. She has been using her albuterol inhaler every 4 hours.   Dyspnea:  nl activities no trouble / not aerobically active  Cough: worse when lies down but present daytime also  / min clear mucus  Sleeping: bed is flat/ on side / does disturb sleep with subj wheezing supine   SABA use: not helping much  02: none  Covid status:  vax x 3    No obvious patterns in day to day or daytime variability or assoc excess/ purulent sputum or mucus plugs or hemoptysis or cp or chest tightness,  overt sinus or hb symptoms.   Also denies any obvious fluctuation of symptoms with weather or environmental changes or  other aggravating or alleviating factors except as outlined above   No unusual exposure hx or h/o childhood pna/ asthma or knowledge of premature birth.  Current Allergies, Complete Past Medical History, Past Surgical History, Family History, and Social History were reviewed in [employer] record.  ROS  The following are not active complaints unless bolded Hoarseness, sore throat, dysphagia, dental problems, itching, sneezing,  nasal congestion or discharge of excess mucus or purulent secretions, ear ache,   fever, chills, sweats, unintended wt loss or wt gain, classically pleuritic or exertional cp,  orthopnea pnd or arm/hand swelling  or leg swelling, presyncope, palpitations, abdominal pain, anorexia, nausea, vomiting, diarrhea  or change in bowel habits or change in bladder habits, change in stools or change in urine, dysuria, hematuria,  rash, arthralgias, visual complaints, headache, numbness, weakness or ataxia or problems with walking or coordination,  change in mood or  memory.        Current Meds  Medication Sig   albuterol (VENTOLIN HFA) 108 (90 Base) MCG/ACT inhaler Inhale 2 puffs into the lungs every 4 (four) hours as needed for wheezing or shortness of breath.   B Complex Vitamins (B COMPLEX 1 PO) Take by mouth.   Calcium Carbonate-Vit D-Min (CALTRATE PLUS PO) Take 1 tablet by mouth daily.   Cholecalciferol (VITAMIN D HIGH POTENCY PO) Take by mouth. 5000 units per day   dextromethorphan-guaiFENesin (MUCINEX DM) 30-600 MG 12hr tablet Take 1 tablet by mouth 2 (two) times daily as needed for cough.   losartan (COZAAR) 50 MG tablet TAKE ONE TABLET BY MOUTH ONE TIME DAILY   omeprazole (PRILOSEC) 20 MG capsule Take 1 capsule (20 mg total) by mouth daily as needed.             Objective:   Physical Exam      05/28/2021     157   12/19/17 153 lb (69.4 kg)  12/11/17 157 lb (71.2 kg)  11/23/17 158 lb 12.8 oz (72 kg)     Vital signs reviewed  05/28/2021  -  Note at rest 02 sats  96% on RA   General appearance:    pleasant amb [place] female  / harsh upper airway cough with prominent pseudowheeze    HEENT : pt wearing mask not removed for exam due to covid -19 concerns.    NECK :  without JVD/Nodes/TM/ nl carotid upstrokes bilaterally   LUNGS: no acc muscle use,  Nl contour chest which is clear to A and P bilaterally without cough on insp or exp maneuvers   CV:  RRR  no s3 or murmur or increase in P2, and no edema   ABD:  soft and nontender with nl inspiratory excursion in the supine position. No bruits or organomegaly appreciated, bowel sounds nl  MS:  Nl gait/ ext warm without deformities, calf tenderness, cyanosis or clubbing No obvious joint restrictions   SKIN: warm and dry without lesions    NEURO:  alert, approp, nl sensorium with  no motor or cerebellar deficits apparent.    I personally reviewed images and agree with radiology impression as follows:  CXR:   05/13/21  Negative for acute cardiopulmonary disease       Assessment:

## 2021-05-28 NOTE — Patient Instructions (Addendum)
Prednisone 10 mg take  4 each am x 2 days,   2 each am x 2 days,  1 each am x 2 days and stop     The key to effective treatment for your cough is eliminating the non-stop cycle of cough you're stuck in long enough to let your airway heal completely and then see if there is anything still making you cough once you stop the cough suppression, but this should take no more than 5 days to figure out  First take delsym two tsp every 12 hours and supplement if needed with  tramadol 50 mg up to 1 every 4 hours to suppress the urge to cough at all or even clear your throat. Swallowing water or using ice chips/non mint and menthol containing candies (such as lifesavers or sugarless jolly ranchers) are also effective.  You should rest your voice and avoid activities that you know make you cough.  Once you have eliminated the cough for 3 straight days try reducing the tramadol first,  then the delsym as tolerated.     Protonix (pantoprazole) Take 30-60 min before first meal of the day and Pepcid 20 mg one bedtime plus chlorpheniramine 4 mg x 2 at bedtime (both available over the counter)  until cough is completely gone for at least a week without the need for cough suppression  GERD (REFLUX)  is an extremely common cause of respiratory symptoms, many times with no significant heartburn at all.    It can be treated with medication, but also with lifestyle changes including avoidance of late meals, excessive alcohol, smoking cessation, and avoid fatty foods, chocolate, peppermint, colas, red wine, and acidic juices such as orange juice.  NO MINT OR MENTHOL PRODUCTS SO NO COUGH DROPS   USE HARD CANDY INSTEAD (jolley ranchers or Stover's or Lifesavers (all available in sugarless versions) NO OIL BASED VITAMINS - use powdered substitutes.  If the cough starts back up as you taper the prednisone or need more albuterol then start the symbicort 80 Take 2 puffs first thing in am and then another 2 puffs about 12 hours  later.    Work on inhaler technique:  relax and gently blow all the way out then take a nice smooth full deep breath back in, triggering the inhaler at same time you start breathing in.  Hold for up to 5 seconds if you can. Blow out thru nose. Rinse and gargle with water when done.  If mouth or throat bother you at all,  try brushing teeth/gums/tongue with arm and hammer toothpaste/ make a slurry and gargle and spit out.        If you are satisfied with your treatment plan,  let your doctor know and he/she can either refill your medications or you can return here when your prescription runs out.     If in any way you are not 100% satisfied,  please tell us.  If 100% better, tell your friends!  Pulmonary follow up is as needed

## 2021-05-29 ENCOUNTER — Encounter: Payer: Self-pay | Admitting: Internal Medicine

## 2021-05-29 NOTE — Assessment & Plan Note (Signed)
Onset around 2014 FENO 12/19/2017  = 12 on singulair  symb 160 2bid but none on day of ov and hfa baseline quite poor  - 12/19/2017  After extensive coaching inhaler device  effectiveness =    50% from a baseline of 0> try symb 80 2bid x 2 week sample   - Allergy profile 12/19/2017 >  Eos 0.1 /  IgE  44 RAST neg - 05/28/2021  After extensive coaching inhaler device,  effectiveness =    75% from a baseline of nearly 0   Of the three most common causes of  Sub-acute / recurrent or chronic cough, only one (GERD)  can actually contribute to/ trigger  the other two (asthma and post nasal drip syndrome)  and perpetuate the cylce of cough.  While not intuitively obvious, many patients with chronic low grade reflux do not cough until there is a primary insult that disturbs the protective epithelial barrier and exposes sensitive nerve endings.   This is typically viral but can due to PNDS and  either may apply here.   The point is that once this occurs, it is difficult to eliminate the cycle  using anything but a maximally effective acid suppression regimen at least in the short run, accompanied by an appropriate diet to address non acid GERD and control / eliminate the cough itself for at least 3 days with tramadol and eliminate pnds with 1st gen H1 blockers per guidelines  >>> also so added 6 day taper off  Prednisone starting at 40 mg per day in case of component of Th-2 driven upper or lower airways inflammation (if cough responds short term only to relapse before return while will on full rx for uacs (as above), then  that would point to allergic rhinitis/ asthma or eos bronchitis as alternative dx)   If cough recurs as pred tapered off rec start symbicort 80 2bid and if that fails to control cough a trial of gabapentin would be in order vs refer to ENT / [facility] Dr Addison Bailey.           Each maintenance medication was reviewed in detail including emphasizing most importantly the difference between  maintenance and prns and under what circumstances the prns are to be triggered using an action plan format where appropriate.  Total time for H and P, chart review, counseling, reviewing hfa device(s) and generating customized AVS unique to this office visit for pt > 3 y from prior eval / same day charting = 45 min

## 2021-05-31 ENCOUNTER — Telehealth: Payer: Self-pay | Admitting: Internal Medicine

## 2021-05-31 NOTE — Telephone Encounter (Signed)
I called the patient and she is aware that someone from the pharmacy team will call her once they get a response. Nothing further needed.

## 2021-06-01 ENCOUNTER — Telehealth: Payer: Self-pay | Admitting: Pharmacy Technician

## 2021-06-01 ENCOUNTER — Other Ambulatory Visit (HOSPITAL_COMMUNITY): Payer: Self-pay

## 2021-06-01 NOTE — Telephone Encounter (Signed)
Patient Advocate Encounter   Received notification from CoverMyMed/ office that prior authorization for Tramadol is required by his/her insurance [employer].  Per Test Claim: No PA needed. Only a 5 day supply.   Called Pharmacy: [facility], they were able to process.    Armanda Magic, CPhT Patient [facility] Phone: [phone] Fax:  [phone]

## 2021-06-01 NOTE — Telephone Encounter (Signed)
Noted by triage.  °Will close encounter.  °

## 2021-06-05 ENCOUNTER — Other Ambulatory Visit: Payer: Self-pay | Admitting: Internal Medicine

## 2021-06-11 ENCOUNTER — Encounter: Payer: Self-pay | Admitting: Internal Medicine

## 2021-06-11 ENCOUNTER — Ambulatory Visit: Payer: BC Managed Care – PPO | Admitting: Internal Medicine

## 2021-06-11 ENCOUNTER — Other Ambulatory Visit: Payer: Self-pay

## 2021-06-11 DIAGNOSIS — J45991 Cough variant asthma: Secondary | ICD-10-CM

## 2021-06-11 MED ORDER — PREDNISONE 10 MG PO TABS: ORAL_TABLET | ORAL | 0 refills | Status: DC

## 2021-06-11 MED ORDER — ALBUTEROL SULFATE HFA 108 (90 BASE) MCG/ACT IN AERS: 2.0000 | INHALATION_SPRAY | RESPIRATORY_TRACT | 10 refills | Status: DC | PRN

## 2021-06-11 NOTE — Patient Instructions (Signed)
Plan A = Automatic = Always=    Symbicort 80 Take 2 puffs first thing in am and then another 2 puffs about 12 hours later.    Work on inhaler technique:  relax and gently blow all the way out then take a nice smooth full deep breath back in, triggering the inhaler at same time you start breathing in.  Hold for up to 5 seconds if you can. Blow out thru nose. Rinse and gargle with water when done.  If mouth or throat bother you at all,  try brushing teeth/gums/tongue with arm and hammer toothpaste/ make a slurry and gargle and spit out.       Plan B = Backup (to supplement plan A, not to replace it) for breathing/ wheezing  Only use your albuterol inhaler as a rescue medication to be used if you can't catch your breath by resting or doing a relaxed purse lip breathing pattern.  - The less you use it, the better it will work when you need it. - Ok to use the inhaler up to 2 puffs  every 4 hours if you must but call for appointment if use goes up over your usual need - Don't leave home without it !!  (think of it like the spare tire for your car)    For drainage / throat tickle try take CHLORPHENIRAMINE  4 mg  (Chlortab 4mg   at [employer] should be easiest to find in the green box)  take one every 4 hours as needed - available over the counter- may cause drowsiness so start with just a dose or two an hour before bedtime and see how you tolerate it before trying in daytime     If A and B aren't working > Prednisone 10 mg take  4 each am x 2 days,   2 each am x 2 days,  1 each am x 2 days and stop     Keep your previous appointment

## 2021-06-11 NOTE — Progress Notes (Signed)
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 Regis Bill. Pt c/o SOB for the past 2 months. She gets winded if she talks to much. She also gets SOB with walking but unable to say how far she walks before she notices a problem.   cough came on abruptly and of March 2018 with rhinitis/nasal discharge/ some sneezing with subjective wheezing   rx pred/delsym/ proair (not very helpful) changed to symb/singulair and gradually improved but still harsh coughing fits sporadic pattern        Kouffman Reflux v Neurogenic Cough Differentiator Reflux Comments  Do you awaken from a sound sleep coughing violently?                            With trouble breathing? Yes   Do you have choking episodes when you cannot  Get enough air, gasping for air ?              NO    Do you usually cough when you lie down into  The bed, or when you just lie down to rest ?                          Not really    Do you usually cough after meals or eating?         Worse    Do you cough when (or after) you bend over?    No    GERD SCORE     Kouffman Reflux v Neurogenic Cough Differentiator Neurogenic   Do you more-or-less cough all day long? Sporadic    Does change of temperature make you cough? no   Does laughing or chuckling cause you to cough? no   Do fumes (perfume, automobile fumes, burned  Toast, etc.,)  cause you to cough ?      no   Does speaking, singing, or talking on the phone cause you to cough   ?               No    Neurogenic/Airway score         Unless coughing not sob  Rec symbicort change to 80 strength and take up to 2 every 12 hours if it's helping  Work on inhaler technique:   First take delsym two tsp every 12 hours and supplement if needed with  tramadol 50 mg up to 1 every 4 hours to suppress the urge to cough at all or even clear your throat.  Prednisone 10 mg take  4 each am x 2 days,   2 each am x 2 days,  1 each am x 2 days and stop (this is to eliminate allergies and inflammation from  coughing) Protonix (pantoprazole) Take 30-60 min before first meal of the day and Pepcid 20 mg one bedtime plus chlorpheniramine 4 mg x 2 at bedtime (both available over the counter)  until cough is completely gone for at least a week without the need for cough suppression GERD diet reviewed, bed blocks       05/28/2021 Re-establish /[name] re: daily throat clearing 2014 never completely resolved then recurrent severe cough flare p covid Mid Sep 2022 maint on prilosec 20 mg one before supper   Chief Complaint  Patient presents with   Pulmonary Consult    04/19/21 dx with covid- sore throat and cough- tx with antiviral. Cough has lingered since then and now wheezing- pred helps but symptoms return one she completes med. She has been using her albuterol inhaler every 4 hours.   Dyspnea:  nl activities no trouble / not aerobically active  Cough: worse when lies down but present daytime also  / min clear mucus  Sleeping: bed is flat/ on side / does disturb sleep with subj wheezing supine   SABA use: not helping much  02: none  Covid status:  vax x 3  Rec Prednisone 10 mg take  4 each am x 2 days,   2 each am x 2 days,  1 each am x 2 days and stop    The key to effective treatment for your cough is eliminating the non-stop cycle of cough you're stuck in First take delsym two tsp every 12  hours and supplement if needed with  tramadol 50 mg up to 1 every 4 hours    Protonix (pantoprazole) Take 30-60 min before first meal of the day and Pepcid 20 mg one bedtime plus chlorpheniramine 4 mg x 2 at bedtime (both available over the counter)  until cough is completely gone for at least a week without the need for cough suppression GERD rx If the cough starts back up as you taper the prednisone or need more albuterol then start the symbicort 80 Take 2 puffs first thing in am and then another 2 puffs about 12 hours later.  Work on inhaler technique:   06/11/2021  f/u ov/[name] re: uacs vs cough variant asthma  maint on symb 80 2bid (50% technique) not taking gerd rx correctly and has only used 9 tramadol total in last 14 days Chief Complaint  Patient presents with   Follow-up    Pt states cough with mucous   Dyspnea:  Not limited by breathing from desired activities   Cough:  more day than noct as takes tramadol at hs and really min mucoid sputum production  Sleeping: does fine once asleep,flat SABA use: not using but thinks it may have helped  02: none   Wheezing bothers her towards bedtime and persisted even on prednisone    No obvious day to day or daytime variability or assoc excess/ purulent sputum or mucus plugs or hemoptysis or cp or chest tightness,   overt sinus or hb symptoms.   Sleeping p tramadol  without nocturnal  or early am exacerbation  of respiratory  c/o's or need for noct saba. Also denies any obvious fluctuation of symptoms with weather or environmental changes or other aggravating or alleviating factors except as outlined above   No unusual exposure hx or h/o childhood pna/ asthma or knowledge of premature birth.  Current Allergies, Complete Past Medical History, Past Surgical History, Family History, and Social History were reviewed in [employer] record.  ROS  The following are not active complaints unless bolded Hoarseness, sore throat,  dysphagia, dental problems, itching, sneezing,  nasal congestion or discharge of excess mucus or purulent secretions, ear ache,   fever, chills, sweats, unintended wt loss or wt gain, classically pleuritic or exertional cp,  orthopnea pnd or arm/hand swelling  or leg swelling, presyncope, palpitations, abdominal pain, anorexia, nausea, vomiting, diarrhea  or change in bowel habits or change in bladder habits, change in stools or change in urine, dysuria, hematuria,  rash, arthralgias, visual complaints, headache, numbness, weakness or ataxia or problems with walking or coordination,  change in mood or  memory.        Current Meds  Medication Sig   albuterol (VENTOLIN HFA) 108 (90 Base) MCG/ACT inhaler Inhale 2 puffs into the lungs every 4 (four) hours as needed for wheezing or shortness of breath.   B Complex Vitamins (B COMPLEX 1 PO) Take by mouth.   budesonide-formoterol (SYMBICORT) 80-4.5 MCG/ACT inhaler Take 2 puffs first thing in am and then another 2 puffs about 12 hours later.   Calcium Carbonate-Vit D-Min (CALTRATE PLUS PO) Take 1 tablet by mouth daily.   Cholecalciferol (VITAMIN D HIGH POTENCY PO) Take by mouth. 5000 units per day   dextromethorphan-guaiFENesin (MUCINEX DM) 30-600 MG 12hr tablet Take 1 tablet by mouth 2 (two) times daily as needed for cough.   famotidine (PEPCID) 20 MG tablet One after supper   losartan (COZAAR) 50 MG tablet TAKE ONE TABLET BY MOUTH ONE TIME DAILY   pantoprazole (PROTONIX) 40 MG tablet Take 1 tablet (40 mg total) by mouth daily. Take 30-60 min before first meal of the day   predniSONE (DELTASONE) 10 MG tablet Take  4 each am x 2 days,   2 each am x 2 days,  1 each am x 2 days and stop   traMADol (ULTRAM) 50 MG tablet Take by mouth every 6 (six) hours as needed.                Objective:   Physical Exam     06/11/2021     155  05/28/2021     157   12/19/17 153 lb (69.4 kg)  12/11/17 157 lb (71.2 kg)  11/23/17 158 lb 12.8 oz (72 kg)     Vital  signs reviewed  06/11/2021  - Note at rest 02 sats  96% on RA   General appearance:    amb pleasant [place] female nad    HEENT : pt wearing mask not removed for exam due to covid -19 concerns.    NECK :  without JVD/Nodes/TM/ nl carotid upstrokes bilaterally   LUNGS: no acc muscle use,  Nl contour chest which is clear to A and P bilaterally without cough on insp or exp maneuvers   CV:  RRR  no s3 or murmur or increase in P2, and no edema   ABD:  soft and nontender with nl inspiratory excursion in the supine position. No bruits or organomegaly appreciated, bowel sounds nl  MS:  Nl gait/ ext warm without deformities, calf tenderness, cyanosis or clubbing No obvious joint restrictions   SKIN: warm and dry without lesions    NEURO:  alert, approp, nl sensorium with  no motor or cerebellar deficits apparent.         Assessment:

## 2021-06-12 ENCOUNTER — Encounter: Payer: Self-pay | Admitting: Internal Medicine

## 2021-06-12 NOTE — Assessment & Plan Note (Signed)
Onset around 2014 FENO 12/19/2017  = 12 on singulair  symb 160 2bid but none on day of ov and hfa baseline quite poor  - 12/19/2017  After extensive coaching inhaler device  effectiveness =    50% from a baseline of 0> try symb 80 2bid x 2 week sample   - Allergy profile 12/19/2017 >  Eos 0.1 /  IgE  44 RAST neg - 05/28/2021  After extensive coaching inhaler device,  effectiveness =    75% from a baseline of nearly 0  - 06/11/2021  After extensive coaching inhaler device,  effectiveness =    50% (short ti) > continue symbicort 80 2bid   Not clear at all she was able to complete the cyclical cough regimen reviewed with her today or to what extent she really responded to prednisone but if she did I would expect her to respond to symb 80 2bid even if only gets 50% in on any breath and if not repeat the prednisone x 6 days but this time also use max gerd Rx / diet /bed blocks and 1st gen H1 blockers per guidelines           Each maintenance medication was reviewed in detail including emphasizing most importantly the difference between maintenance and prns and under what circumstances the prns are to be triggered using an action plan format where appropriate.  Total time for H and P, chart review, counseling, reviewing hfa  device(s) and generating customized AVS unique to this office visit / same day charting = 37 min

## 2021-06-15 NOTE — Telephone Encounter (Signed)
Received the following message from patient:   "Hello Dr. Melvyn Novas,   Just wanted to check with you if it would be alright to take my 2nd Booster before I go , given my health condition , since I am travelling to [place] on December 2. Looking forward to hearing from you. Thank you.  [name]"  MW, can you please advise? Thanks!

## 2021-06-16 DIAGNOSIS — Z23 Encounter for immunization: Secondary | ICD-10-CM | POA: Diagnosis not present

## 2021-06-17 NOTE — Telephone Encounter (Signed)
So there is not enough data of known risk benefit of vaccinating early after an infection.  To advise you I usually advise to wait 4 to 6 months or at least 90 days from infection.  Unless someone is severely immunocompromised. That is to avoid ae hyperimmune response unknown side effects.  Potential.   The vaccine only protects against serious disease and not so much against infection .   since you have hybrid immunity from natural infection and the vaccine I am not sure getting a booster will be helpful at this time.    So basically we do not know how it will help or harm.  If you were my family member I would p advise you to wait on the booster since you do not have a serious underlying disease   That being said you are traveling across the world and I leave the decision up to you.
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
## 2021-07-01 ENCOUNTER — Encounter: Payer: Self-pay | Admitting: Internal Medicine

## 2021-07-01 ENCOUNTER — Ambulatory Visit (INDEPENDENT_AMBULATORY_CARE_PROVIDER_SITE_OTHER): Payer: BC Managed Care – PPO | Admitting: Internal Medicine

## 2021-07-01 VITALS — BP 144/86 | HR 95 | Temp 98.4°F | Ht 59.0 in | Wt 157.0 lb

## 2021-07-01 DIAGNOSIS — I10 Essential (primary) hypertension: Secondary | ICD-10-CM | POA: Diagnosis not present

## 2021-07-01 DIAGNOSIS — R739 Hyperglycemia, unspecified: Secondary | ICD-10-CM | POA: Diagnosis not present

## 2021-07-01 DIAGNOSIS — E7800 Pure hypercholesterolemia, unspecified: Secondary | ICD-10-CM

## 2021-07-01 DIAGNOSIS — J45991 Cough variant asthma: Secondary | ICD-10-CM

## 2021-07-01 DIAGNOSIS — Z0000 Encounter for general adult medical examination without abnormal findings: Secondary | ICD-10-CM

## 2021-07-01 DIAGNOSIS — Z79899 Other long term (current) drug therapy: Secondary | ICD-10-CM

## 2021-07-01 DIAGNOSIS — R058 Other specified cough: Secondary | ICD-10-CM

## 2021-07-01 LAB — BASIC METABOLIC PANEL
BUN: 13 mg/dL (ref 6–23)
CO2: 32 mEq/L (ref 19–32)
Calcium: 9.5 mg/dL (ref 8.4–10.5)
Chloride: 102 mEq/L (ref 96–112)
Creatinine, Ser: 0.71 mg/dL (ref 0.40–1.20)
GFR: 90.96 mL/min (ref >60.00)
Glucose, Bld: 102 mg/dL — ABNORMAL HIGH (ref 70–99)
Potassium: 4.4 mEq/L (ref 3.5–5.1)
Sodium: 140 mEq/L (ref 135–145)

## 2021-07-01 LAB — LIPID PANEL
Cholesterol: 215 mg/dL — ABNORMAL HIGH (ref 0–200)
HDL: 74.7 mg/dL (ref >39.00)
LDL Cholesterol: 112 mg/dL — ABNORMAL HIGH (ref 0–99)
NonHDL: 139.81
Total CHOL/HDL Ratio: 3
Triglycerides: 141 mg/dL (ref 0.0–149.0)
VLDL: 28.2 mg/dL (ref 0.0–40.0)

## 2021-07-01 LAB — CBC WITH DIFFERENTIAL/PLATELET
Basophils Absolute: 0.1 10*3/uL (ref 0.0–0.1)
Basophils Relative: 0.7 % (ref 0.0–3.0)
Eosinophils Absolute: 0.4 10*3/uL (ref 0.0–0.7)
Eosinophils Relative: 4.8 % (ref 0.0–5.0)
HCT: 43.1 % (ref 36.0–46.0)
Hemoglobin: 14.1 g/dL (ref 12.0–15.0)
Lymphocytes Relative: 28.3 % (ref 12.0–46.0)
Lymphs Abs: 2.4 10*3/uL (ref 0.7–4.0)
MCHC: 32.8 g/dL (ref 30.0–36.0)
MCV: 85.6 fl (ref 78.0–100.0)
Monocytes Absolute: 0.7 10*3/uL (ref 0.1–1.0)
Monocytes Relative: 8.1 % (ref 3.0–12.0)
Neutro Abs: 5 10*3/uL (ref 1.4–7.7)
Neutrophils Relative %: 58.1 % (ref 43.0–77.0)
Platelets: 318 10*3/uL (ref 150.0–400.0)
RBC: 5.03 Mil/uL (ref 3.87–5.11)
RDW: 14.3 % (ref 11.5–15.5)
WBC: 8.6 10*3/uL (ref 4.0–10.5)

## 2021-07-01 LAB — HEPATIC FUNCTION PANEL
ALT: 18 U/L (ref 0–35)
AST: 21 U/L (ref 0–37)
Albumin: 4.1 g/dL (ref 3.5–5.2)
Alkaline Phosphatase: 71 U/L (ref 39–117)
Bilirubin, Direct: 0 mg/dL (ref 0.0–0.3)
Total Bilirubin: 0.4 mg/dL (ref 0.2–1.2)
Total Protein: 7.3 g/dL (ref 6.0–8.3)

## 2021-07-01 LAB — TSH: TSH: 1.61 u[IU]/mL (ref 0.35–5.50)

## 2021-07-01 LAB — HEMOGLOBIN A1C: Hgb A1c MFr Bld: 6.6 % — ABNORMAL HIGH (ref 4.6–6.5)

## 2021-07-01 NOTE — Patient Instructions (Addendum)
Good to see you today .   Will notify you  of labs when available.   Goal   blood pressure 130/80 and below.   Safe travels

## 2021-07-01 NOTE — Progress Notes (Signed)
Cholesterol about the same thyroid liver and blood count normal blood sugar borderline elevated hemoglobin A1c is up in the borderline early diabetes range.  Please limit simple carbohydrates which would include white rice processed carbohydrates and sugars. Advise follow-up in 4 to 6 months and we can do another hemoglobin A1c in the office to make sure your sugar levels has not gone up, to check on your sugar level

## 2021-07-02 ENCOUNTER — Encounter: Payer: BC Managed Care – PPO | Admitting: Internal Medicine

## 2021-07-14 ENCOUNTER — Encounter: Payer: Self-pay | Admitting: Internal Medicine

## 2021-08-01 HISTORY — PX: OTHER SURGICAL HISTORY: SHX169

## 2021-08-06 ENCOUNTER — Other Ambulatory Visit: Payer: Self-pay

## 2021-08-06 ENCOUNTER — Encounter: Payer: Self-pay | Admitting: Internal Medicine

## 2021-08-06 ENCOUNTER — Ambulatory Visit: Payer: BC Managed Care – PPO | Admitting: Internal Medicine

## 2021-08-06 DIAGNOSIS — J45991 Cough variant asthma: Secondary | ICD-10-CM

## 2021-08-06 MED ORDER — GABAPENTIN 100 MG PO CAPS: 100.0000 mg | ORAL_CAPSULE | Freq: Three times a day (TID) | ORAL | 2 refills | Status: DC

## 2021-08-06 NOTE — Progress Notes (Signed)
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 Regis Bill. Pt c/o SOB for the past 2 months. She gets winded if she talks to much. She also gets SOB with walking but unable to say how far she walks before she notices a problem.   cough came on abruptly and of March 2018 with rhinitis/nasal discharge/ some sneezing with subjective wheezing   rx pred/delsym/ proair (not very helpful) changed to symb/singulair and gradually improved but still harsh coughing fits sporadic pattern        [name] v Neurogenic Cough Differentiator Ortiz Comments  Do you awaken from a sound sleep coughing violently?                            With trouble breathing? Yes   Do you have choking episodes when you cannot  Get enough air, gasping for air ?              NO    Do you usually cough when you lie down into  The bed, or when you just lie down to rest ?                          Not really    Do you usually cough after meals or eating?         Worse    Do you cough when (or after) you bend over?    No    GERD SCORE     [name] v Neurogenic Cough Differentiator Neurogenic   Do you more-or-less cough all day long? Sporadic    Does change of temperature make you cough? no   Does laughing or chuckling cause you to cough? no   Do fumes (perfume, automobile fumes, burned  Toast, etc.,)  cause you to cough ?      no   Does speaking, singing, or talking on the phone cause you to cough   ?               No    Neurogenic/Airway score         Unless coughing not sob  Rec symbicort change to 80 strength and take up to 2 every 12 hours if it's helping  Work on inhaler technique:   First take delsym two tsp every 12 hours and supplement if needed with  tramadol 50 mg up to 1 every 4 hours to suppress the urge to cough at all or even clear your throat.  Prednisone 10 mg take  4 each am x 2 days,   2 each am x 2 days,  1 each am x 2 days and stop (this is to eliminate allergies and inflammation from  coughing) Protonix (pantoprazole) Take 30-60 min before first meal of the day and Pepcid 20 mg one bedtime plus chlorpheniramine 4 mg x 2 at bedtime (both available over the counter)  until cough is completely gone for at least a week without the need for cough suppression GERD diet reviewed, bed blocks       05/28/2021 Re-establish /[name] re: daily throat clearing 2014 never completely resolved then recurrent severe cough flare p covid Mid Sep 2022 maint on prilosec 20 mg one before supper   Chief Complaint  Patient presents with   Pulmonary Consult    04/19/21 dx with covid- sore throat and cough- tx with antiviral. Cough has lingered since then and now wheezing- pred helps but symptoms return one she completes med. She has been using her albuterol inhaler every 4 hours.   Dyspnea:  nl activities no trouble / not aerobically active  Cough: worse when lies down but present daytime also  / min clear mucus  Sleeping: bed is flat/ on side / does disturb sleep with subj wheezing supine   SABA use: not helping much  02: none  Covid status:  vax x 3  Rec Prednisone 10 mg take  4 each am x 2 days,   2 each am x 2 days,  1 each am x 2 days and stop    The key to effective treatment for your cough is eliminating the non-stop cycle of cough you're stuck in First take delsym two tsp every 12  hours and supplement if needed with  tramadol 50 mg up to 1 every 4 hours    Protonix (pantoprazole) Take 30-60 min before first meal of the day and Pepcid 20 mg one bedtime plus chlorpheniramine 4 mg x 2 at bedtime (both available over the counter)  until cough is completely gone for at least a week without the need for cough suppression GERD rx If the cough starts back up as you taper the prednisone or need more albuterol then start the symbicort 80 Take 2 puffs first thing in am and then another 2 puffs about 12 hours later.  Work on inhaler technique:   06/11/2021  f/u ov/[name] re: uacs vs cough variant asthma  maint on symb 80 2bid (50% technique) not taking gerd rx correctly and has only used 9 tramadol total in last 14 days Chief Complaint  Patient presents with   Follow-up    Pt states cough with mucous   Dyspnea:  Not limited by breathing from desired activities   Cough:  more day than noct as takes tramadol at hs and really min mucoid sputum production  Sleeping: does fine once asleep,flat SABA use: not using but thinks it may have helped  02: none  Wheezing bothers her towards bedtime and persisted even on prednisone  Rec Plan A = Automatic = Always=    Symbicort 80 Take 2 puffs first thing in am and then another 2 puffs about 12 hours later.  Work on inhaler technique:   Plan B = Backup (to supplement plan A, not to replace it) for breathing/ wheezing  Only use your albuterol inhaler as a rescue medication For drainage / throat tickle try take CHLORPHENIRAMINE  4 mg   If A and B aren't working > Prednisone 10 mg take  4 each am x 2 days,   2 each am x 2 days,  1 each am x 2 days and stop  Keep your previous appointment  08/06/2021  f/u ov/[name] re: asthma/uacs/ flare while in [place] exp toPerfume   maint on symbort 80 2bid / 1st gen H1 blockers/ gerd rx/ never took prednisone as above Chief Complaint  Patient presents with   Follow-up    Just returned home from [place]- c/o  minimal cough and wheezing. Her cough is prod with clear sputum.    Dyspnea:  Not limited by breathing from desired activities   Cough: dry/better p saba/ never really stopped throat clearing p last ov per husband Sleeping: flat bed/ on side no resp cc SABA use:  tid ? If really helps  02: none  Covid status:vax x 4    No obvious day to day or daytime variability or assoc excess/ purulent sputum or mucus plugs or hemoptysis or cp or chest tightness, subjective wheeze or overt sinus or hb symptoms.   Sleeping  without nocturnal  or early am exacerbation  of respiratory  c/o's or need for noct saba. Also denies any obvious fluctuation of symptoms with weather or environmental changes or other aggravating or alleviating factors except as outlined above   No unusual exposure hx or h/o childhood pna/ asthma or knowledge of premature birth.  Current Allergies, Complete Past Medical History, Past Surgical History, Family History, and Social History were reviewed in [employer] record.  ROS  The following are not active complaints unless bolded Hoarseness, sore throat, dysphagia, dental problems, itching, sneezing,  nasal congestion or discharge of excess mucus or purulent secretions, ear ache,   fever, chills, sweats, unintended wt loss or wt gain, classically pleuritic or exertional cp,  orthopnea pnd or arm/hand swelling  or leg swelling, presyncope, palpitations, abdominal pain, anorexia, nausea, vomiting, diarrhea  or change in bowel habits or change in bladder habits, change in stools or change in urine, dysuria, hematuria,  rash, arthralgias, visual complaints, headache, numbness, weakness or ataxia or problems with walking or coordination,  change in mood or  memory.        Current Meds  Medication Sig   albuterol (VENTOLIN HFA) 108 (90 Base) MCG/ACT inhaler Inhale 2 puffs into the lungs every 4 (four) hours as needed for wheezing or shortness of breath.    budesonide-formoterol (SYMBICORT) 80-4.5 MCG/ACT inhaler Take 2 puffs first thing in am and then another 2 puffs about 12 hours later.   famotidine (PEPCID) 20 MG tablet One after supper   pantoprazole (PROTONIX) 40 MG tablet Take 1 tablet (40 mg total) by mouth daily. Take 30-60 min before first meal of the day   traMADol (ULTRAM) 50 MG tablet Take 50 mg by mouth every 6 (six) hours as needed.                Objective:   Physical Exam   wts   08/06/2021        161 06/11/2021     155  05/28/2021     157   12/19/17 153 lb (69.4 kg)  12/11/17 157 lb (71.2 kg)  11/23/17 158 lb 12.8 oz (72 kg)     Vital signs reviewed  08/06/2021  - Note at rest 02 sats  96% on RA   General appearance:    amb [place] female, freq throat clearing   HEENT : pt wearing mask not removed for exam due to covid -19 concerns.    NECK :  without JVD/Nodes/TM/ nl carotid upstrokes bilaterally   LUNGS: no acc muscle use,  Nl contour chest which is clear to A  and P bilaterally without cough on insp or exp maneuvers   CV:  RRR  no s3 or murmur or increase in P2, and no edema   ABD:  soft and nontender with nl inspiratory excursion in the supine position. No bruits or organomegaly appreciated, bowel sounds nl  MS:  Nl gait/ ext warm without deformities, calf tenderness, cyanosis or clubbing No obvious joint restrictions   SKIN: warm and dry without lesions    NEURO:  alert, approp, nl sensorium with  no motor or cerebellar deficits apparent.      Assessment:

## 2021-08-06 NOTE — Patient Instructions (Signed)
Prednisone 10 mg take  4 each am x 2 days,   2 each am x 2 days,  1 each am x 2 days and stop   Take delsym two tsp every 12 hours and supplement if needed with  tramadol 50 mg up to 2 every 4 hours to suppress the urge to cough. Swallowing water and/or using ice chips/non mint and menthol containing candies (such as lifesavers or sugarless jolly ranchers) are also effective.  You should rest your voice and avoid activities that you know make you cough.  Once you have eliminated the cough for 3 straight days try reducing the tramadol first,  then the delsym as tolerated.     Gabapentin 100 mg three times daily    .Please schedule a follow up office visit in 6 weeks, call sooner if needed

## 2021-08-08 ENCOUNTER — Encounter: Payer: Self-pay | Admitting: Internal Medicine

## 2021-08-08 NOTE — Assessment & Plan Note (Signed)
Onset around 2014 FENO 12/19/2017  = 12 on singulair  symb 160 2bid but none on day of ov and hfa baseline quite poor  - 12/19/2017  After extensive coaching inhaler device  effectiveness =    50% from a baseline of 0> try symb 80 2bid x 2 week sample   - Allergy profile 12/19/2017 >  Eos 0.1 /  IgE  44 RAST neg - 06/11/2021    continue symbicort 80 2bid  - 08/06/2021  After extensive coaching inhaler device,  effectiveness =    80%  -  08/06/2021  Gabapentin 100 mg three times daily >>>  Of the three most common causes of  Sub-acute / recurrent or chronic cough, only one (GERD)  can actually contribute to/ trigger  the other two (asthma and post nasal drip syndrome)  and perpetuate the cylce of cough.  While not intuitively obvious, many patients with chronic low grade reflux do not cough until there is a primary insult that disturbs the protective epithelial barrier and exposes sensitive nerve endings.   This is typically viral but can due to PNDS and  either may apply here.     >>>The point is that once this occurs, it is difficult to eliminate the cycle  using anything but a maximally effective acid suppression regimen at least in the short run, accompanied by an appropriate diet to address non acid GERD and control / eliminate the cough itself for at least 3 days with tramadol and longterm with gabapentin starting at 100 mg tid then regroup in 6 weeks >>> also so added 6 day taper off  Prednisone starting at 40 mg per day in case of component of Th-2 driven upper or lower airways inflammation (if cough responds short term only to relapse before return while will on full rx for uacs (as above), then  that would point to allergic rhinitis/ asthma or eos bronchitis as alternative dx)           Each maintenance medication was reviewed in detail including emphasizing most importantly the difference between maintenance and prns and under what circumstances the prns are to be triggered using an action plan  format where appropriate.  Total time for H and P, chart review, counseling, reviewing hfadevice(s) and generating customized AVS unique to this office visit / same day charting = 24 min

## 2021-08-10 DIAGNOSIS — H2513 Age-related nuclear cataract, bilateral: Secondary | ICD-10-CM | POA: Diagnosis not present

## 2021-08-10 DIAGNOSIS — H25013 Cortical age-related cataract, bilateral: Secondary | ICD-10-CM | POA: Diagnosis not present

## 2021-08-12 ENCOUNTER — Other Ambulatory Visit: Payer: Self-pay | Admitting: Internal Medicine

## 2021-08-12 DIAGNOSIS — Z1231 Encounter for screening mammogram for malignant neoplasm of breast: Secondary | ICD-10-CM
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
## 2021-08-23 ENCOUNTER — Other Ambulatory Visit: Payer: Self-pay | Admitting: Internal Medicine

## 2021-08-23 DIAGNOSIS — J45991 Cough variant asthma: Secondary | ICD-10-CM

## 2021-08-24 ENCOUNTER — Ambulatory Visit: Payer: BC Managed Care – PPO | Admitting: Internal Medicine

## 2021-08-24 ENCOUNTER — Encounter: Payer: Self-pay | Admitting: Internal Medicine

## 2021-08-24 VITALS — BP 130/82 | HR 100 | Temp 97.9°F | Ht 59.0 in | Wt 160.4 lb

## 2021-08-24 DIAGNOSIS — R000 Tachycardia, unspecified: Secondary | ICD-10-CM | POA: Diagnosis not present

## 2021-08-24 DIAGNOSIS — I10 Essential (primary) hypertension: Secondary | ICD-10-CM

## 2021-08-24 DIAGNOSIS — T887XXA Unspecified adverse effect of drug or medicament, initial encounter: Secondary | ICD-10-CM

## 2021-08-24 DIAGNOSIS — R739 Hyperglycemia, unspecified: Secondary | ICD-10-CM

## 2021-08-24 DIAGNOSIS — Z8616 Personal history of COVID-19: Secondary | ICD-10-CM

## 2021-08-24 DIAGNOSIS — J45991 Cough variant asthma: Secondary | ICD-10-CM

## 2021-08-24 DIAGNOSIS — R053 Chronic cough: Secondary | ICD-10-CM

## 2021-08-24 DIAGNOSIS — R058 Other specified cough: Secondary | ICD-10-CM | POA: Diagnosis not present

## 2021-08-24 NOTE — Patient Instructions (Addendum)
Will plan chest CT to check lungs  this still could be post covid   damage and may need to take a while   to resolve.  Will get cards eval for the fast heart rate .  Etc .    Will notify dr Melvyn Novas also what is going on.   Plan fu .

## 2021-08-25 ENCOUNTER — Ambulatory Visit: Payer: BC Managed Care – PPO | Admitting: Internal Medicine

## 2021-08-30 ENCOUNTER — Ambulatory Visit (HOSPITAL_BASED_OUTPATIENT_CLINIC_OR_DEPARTMENT_OTHER)
Admission: RE | Admit: 2021-08-30 | Discharge: 2021-08-30 | Disposition: A | Discharge status: Home / Self Care | Payer: BC Managed Care – PPO | Source: Ambulatory Visit | Attending: Internal Medicine | Admitting: Internal Medicine

## 2021-08-30 ENCOUNTER — Other Ambulatory Visit: Payer: Self-pay

## 2021-08-30 DIAGNOSIS — R053 Chronic cough: Secondary | ICD-10-CM | POA: Diagnosis not present

## 2021-08-30 DIAGNOSIS — J45991 Cough variant asthma: Secondary | ICD-10-CM | POA: Diagnosis not present

## 2021-08-30 DIAGNOSIS — R918 Other nonspecific abnormal finding of lung field: Secondary | ICD-10-CM | POA: Diagnosis not present

## 2021-08-30 DIAGNOSIS — R911 Solitary pulmonary nodule: Secondary | ICD-10-CM | POA: Diagnosis not present

## 2021-08-30 DIAGNOSIS — Z8616 Personal history of COVID-19: Secondary | ICD-10-CM | POA: Diagnosis not present

## 2021-08-30 DIAGNOSIS — I700 Atherosclerosis of aorta: Secondary | ICD-10-CM | POA: Diagnosis not present

## 2021-08-31 NOTE — Progress Notes (Signed)
So the good news is that CT scan of the chest shows no alarming findings in the lungs s(ome incidental pulmonary micronodules which are not often an incidental finding.) There are some calcium deposits in the arteries which shows that you might benefit from being on a cholesterol medicine. I do not think it is related to your symptoms however. I suggest discuss this with a cardiologist when you see them. Forwarding this report also to Dr. Melvyn Novas

## 2021-09-01 ENCOUNTER — Other Ambulatory Visit: Payer: Self-pay | Admitting: Internal Medicine

## 2021-09-20 ENCOUNTER — Ambulatory Visit
Admission: RE | Admit: 2021-09-20 | Discharge: 2021-09-20 | Disposition: A | Discharge status: Home / Self Care | Payer: BC Managed Care – PPO | Source: Ambulatory Visit | Attending: Internal Medicine | Admitting: Internal Medicine

## 2021-09-20 DIAGNOSIS — Z1231 Encounter for screening mammogram for malignant neoplasm of breast: Secondary | ICD-10-CM

## 2021-09-22 ENCOUNTER — Encounter: Payer: Self-pay | Admitting: Interventional Cardiology

## 2021-09-22 ENCOUNTER — Other Ambulatory Visit: Payer: Self-pay

## 2021-09-22 ENCOUNTER — Ambulatory Visit: Payer: BC Managed Care – PPO | Admitting: Interventional Cardiology

## 2021-09-22 VITALS — BP 138/78 | HR 101 | Ht 59.0 in | Wt 160.2 lb

## 2021-09-22 DIAGNOSIS — R000 Tachycardia, unspecified: Secondary | ICD-10-CM

## 2021-09-22 DIAGNOSIS — I2584 Coronary atherosclerosis due to calcified coronary lesion: Secondary | ICD-10-CM | POA: Diagnosis not present

## 2021-09-22 DIAGNOSIS — I2510 Atherosclerotic heart disease of native coronary artery without angina pectoris: Secondary | ICD-10-CM

## 2021-09-22 DIAGNOSIS — I700 Atherosclerosis of aorta: Secondary | ICD-10-CM

## 2021-09-22 MED ORDER — ROSUVASTATIN CALCIUM 10 MG PO TABS: 10.0000 mg | ORAL_TABLET | Freq: Every day | ORAL | 3 refills | Status: DC
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
## 2021-09-22 NOTE — Patient Instructions (Signed)
Medication Instructions:  Your physician has recommended you make the following change in your medication: Start Rosuvastatin 10 mg by mouth daily  *If you need a refill on your cardiac medications before your next appointment, please call your pharmacy*   Lab Work: Your physician recommends that you return for lab work in: 3 months.  Lipid and liver profiles.  This will be fasting  If you have labs (blood work) drawn today and your tests are completely normal, you will receive your results only by: [place] (if you have MyChart) OR A paper copy in the mail If you have any lab test that is abnormal or we need to change your treatment, we will call you to review the results.   Testing/Procedures: Your physician has requested that you have an exercise tolerance test. For further information please visit [email or web address]. Please also follow instruction sheet, as given.    Follow-Up: At [facility], you and your health needs are our priority.  As part of our continuing mission to provide you with exceptional heart care, we have created designated Provider Care Teams.  These Care Teams include your primary Cardiologist (physician) and Advanced Practice Providers (APPs -  Physician Assistants and Nurse Practitioners) who all work together to provide you with the care you need, when you need it.  We recommend signing up for the patient portal called "MyChart".  Sign up information is provided on this After Visit Summary.  MyChart is used to connect with patients for Virtual Visits (Telemedicine).  Patients are able to view lab/test results, encounter notes, upcoming appointments, etc.  Non-urgent messages can be sent to your provider as well.   To learn more about what you can do with MyChart, go to [email or web address].    Your next appointment:   12 month(s)  The format for your next appointment:   In Person  Provider:   Larae Grooms, MD     Other  Instructions

## 2021-09-27 ENCOUNTER — Ambulatory Visit (INDEPENDENT_AMBULATORY_CARE_PROVIDER_SITE_OTHER): Payer: BC Managed Care – PPO | Admitting: Obstetrics & Gynecology

## 2021-09-27 ENCOUNTER — Ambulatory Visit: Payer: BC Managed Care – PPO | Admitting: Internal Medicine

## 2021-09-27 ENCOUNTER — Ambulatory Visit: Payer: BC Managed Care – PPO | Admitting: Obstetrics & Gynecology

## 2021-09-27 ENCOUNTER — Other Ambulatory Visit: Payer: Self-pay

## 2021-09-27 ENCOUNTER — Encounter: Payer: Self-pay | Admitting: Obstetrics & Gynecology

## 2021-09-27 VITALS — BP 172/92 | HR 94 | Ht 58.75 in | Wt 161.0 lb

## 2021-09-27 DIAGNOSIS — E6609 Other obesity due to excess calories: Secondary | ICD-10-CM

## 2021-09-27 DIAGNOSIS — M85851 Other specified disorders of bone density and structure, right thigh: Secondary | ICD-10-CM | POA: Diagnosis not present

## 2021-09-27 DIAGNOSIS — Z780 Asymptomatic menopausal state: Secondary | ICD-10-CM | POA: Diagnosis not present

## 2021-09-27 DIAGNOSIS — Z01419 Encounter for gynecological examination (general) (routine) without abnormal findings: Secondary | ICD-10-CM

## 2021-09-27 DIAGNOSIS — Z6832 Body mass index (BMI) 32.0-32.9, adult: Secondary | ICD-10-CM

## 2021-09-27 NOTE — Progress Notes (Signed)
[name] 1961/06/19 [id number]   History:    61 y.o. G2P2L2  RP:  Established patient presenting for annual gyn exam   HPI: Postmenopausal, well on no HRT. No significant symptoms at this time.  No pelvic pain.  Abstinent. Pap smear/HPV 04/2018.  No significant history of abnormal Pap smears.  Breasts normal.  Mammogram Neg in 09/2021.  Colono 2016, q10 yrs.  Osteopenia on last BD 04/2018 with a T-Score at -1.4 at the Rt Fem Neck, other sites normal.  She is supplementing with vitamin D and Ca++. Will repeat a BD now.  BMI 32.8.  Health Labs with Fam MD.   Past medical history,surgical history, family history and social history were all reviewed and documented in the EPIC chart.  Gynecologic History No LMP recorded. Patient is postmenopausal.  Obstetric History OB History  Gravida Para Term Preterm AB Living  2 2 2     2   SAB IAB Ectopic Multiple Live Births          2    # Outcome Date GA Lbr Len/2nd Weight Sex Delivery Anes PTL Lv  2 Term     M CS-Unspec  N LIV  1 Term     F CS-Unspec  N LIV     ROS: A ROS was performed and pertinent positives and negatives are included in the history.  GENERAL: No fevers or chills. HEENT: No change in vision, no earache, sore throat or sinus congestion. NECK: No pain or stiffness. CARDIOVASCULAR: No chest pain or pressure. No palpitations. PULMONARY: No shortness of breath, cough or wheeze. GASTROINTESTINAL: No abdominal pain, nausea, vomiting or diarrhea, melena or bright red blood per rectum. GENITOURINARY: No urinary frequency, urgency, hesitancy or dysuria. MUSCULOSKELETAL: No joint or muscle pain, no back pain, no recent trauma. DERMATOLOGIC: No rash, no itching, no lesions. ENDOCRINE: No polyuria, polydipsia, no heat or cold intolerance. No recent change in weight. HEMATOLOGICAL: No anemia or easy bruising or bleeding. NEUROLOGIC: No headache, seizures, numbness, tingling or weakness. PSYCHIATRIC: No depression, no loss of interest in  normal activity or change in sleep pattern.     Exam:   BP (!) 172/92    Pulse 94    Ht 4' 10.75" (1.492 m)    Wt 161 lb (73 kg)    SpO2 98%    BMI 32.80 kg/m   Body mass index is 32.8 kg/m.  General appearance : Well developed well nourished female. No acute distress HEENT: Eyes: no retinal hemorrhage or exudates,  Neck supple, trachea midline, no carotid bruits, no thyroidmegaly Lungs: Clear to auscultation, no rhonchi or wheezes, or rib retractions  Heart: Regular rate and rhythm, no murmurs or gallops Breast:Examined in sitting and supine position were symmetrical in appearance, no palpable masses or tenderness,  no skin retraction, no nipple inversion, no nipple discharge, no skin discoloration, no axillary or supraclavicular lymphadenopathy Abdomen: no palpable masses or tenderness, no rebound or guarding Extremities: no edema or skin discoloration or tenderness  Pelvic: Vulva: Normal             Vagina: No gross lesions or discharge  Cervix: No gross lesions or discharge  Uterus  AV, normal size, shape and consistency, non-tender and mobile  Adnexa  Without masses or tenderness  Anus: Normal   Assessment/Plan:  61 y.o. female for annual exam   1. Well female exam with routine gynecological exam Postmenopausal, well on no HRT. No significant symptoms at this time.  No pelvic  pain.  Abstinent. Pap smear/HPV 04/2018.  No significant history of abnormal Pap smears.  Breasts normal.  Mammogram Neg in 09/2021.  Colono 2016, q10 yrs.  Osteopenia on last BD 04/2018 with a T-Score at -1.4 at the Rt Fem Neck, other sites normal.  She is supplementing with vitamin D and Ca++. Will repeat a BD now.  BMI 32.8.  Health Labs with Fam MD.  2. Postmenopause Postmenopausal, well on no HRT. No significant symptoms at this time.  No pelvic pain.  Abstinent.  3. Osteopenia of neck of right femur Osteopenia on last BD 04/2018 with a T-Score at -1.4 at the Rt Fem Neck, other sites normal.  She is  supplementing with vitamin D and Ca++. Will repeat a BD now. - DG Bone Density; Future  4. Class 1 obesity due to excess calories with serious comorbidity and body mass index (BMI) of 32.0 to 32.9 in adult Lower calorie/carb diet.  Increase fitness activities.  Other orders - CALCIUM PO; Take by mouth.   Princess Bruins MD, 3:50 PM 09/27/2021

## 2021-09-28 DIAGNOSIS — Z1159 Encounter for screening for other viral diseases: Secondary | ICD-10-CM | POA: Diagnosis not present

## 2021-09-28 DIAGNOSIS — D329 Benign neoplasm of meninges, unspecified: Secondary | ICD-10-CM | POA: Diagnosis not present

## 2021-09-29 ENCOUNTER — Telehealth: Payer: Self-pay | Admitting: *Deleted

## 2021-09-29 NOTE — Telephone Encounter (Signed)
? ?  Pre-operative Risk Assessment  ?  ?Patient Name: [name]  ?DOB: 26-Apr-1961 ?MRN: [id number]  ? ?  ? ?Request for Surgical Clearance   ? ?Procedure:   CRANIOTOMY WITH EXCISION SUPRATENTORIAL MENINGOMA ? ?Date of Surgery:  Clearance 12/22/21                              ?   ?Surgeon:  DR. Baruch Goldmann ?Surgeon's Group or Practice Name:  [facility] ?Phone number:  [phone] ?Fax number:  [phone] ?  ?Type of Clearance Requested:   ?- Medical  ?  ?Type of Anesthesia:  General  ?  ?Additional requests/questions:   ? ?Signed, ?Julaine Hua   ?09/29/2021, 8:55 AM  ? ?

## 2021-10-02 ENCOUNTER — Other Ambulatory Visit: Payer: Self-pay | Admitting: Internal Medicine

## 2021-10-04 ENCOUNTER — Ambulatory Visit: Payer: BC Managed Care – PPO | Admitting: Internal Medicine

## 2021-10-04 ENCOUNTER — Encounter: Payer: Self-pay | Admitting: Internal Medicine

## 2021-10-04 ENCOUNTER — Other Ambulatory Visit: Payer: Self-pay

## 2021-10-04 DIAGNOSIS — J45991 Cough variant asthma: Secondary | ICD-10-CM

## 2021-10-04 NOTE — Progress Notes (Signed)
Subjective:     Patient ID: [name], female   DOB: 1960-10-02   MRN: [id number]    History of Present Illness  61 yo [place] female Mudlogger of dining for [employer] never smoker with tendency to cough toward end of trips to [place] that  last 2-3 weeks worse after travel back to [place] x 3 trips starting around 2014 referred to pulmonary clinic 12/19/2017 by Dr   Regis Bill with new cough started end of March 2019 s antecedent travel    During the pollen season has noted cough/ wheeze in past but this is different sensation    12/19/2017 1st [facility] office visit/ [place]   Chief Complaint  Patient presents with   Pulmonary Consult    Referred by Dr. Regis Bill. Pt c/o SOB for the past 2 months. She gets winded if she talks to much. She also gets SOB with walking but unable to say how far she walks before she notices a problem.   cough came on abruptly and of March 2018 with rhinitis/nasal discharge/ some sneezing with subjective wheezing   rx pred/delsym/ proair (not very helpful) changed to symb/singulair and gradually improved but still harsh coughing fits sporadic pattern        Kouffman Reflux v Neurogenic Cough Differentiator Reflux Comments  Do you awaken from a sound sleep coughing violently?                            With trouble breathing? Yes   Do you have choking episodes when you cannot  Get enough air, gasping for air ?              NO    Do you usually cough when you lie down into  The bed, or when you just lie down to rest ?                          Not really    Do you usually cough after meals or eating?         Worse    Do you cough when (or after) you bend over?    No    GERD SCORE     Kouffman Reflux v Neurogenic Cough Differentiator Neurogenic   Do you more-or-less cough all day long? Sporadic    Does change of temperature make you cough? no   Does laughing or chuckling cause you to cough? no   Do fumes (perfume, automobile fumes, burned  Toast, etc.,)  cause you to cough ?      no   Does speaking, singing, or talking on the phone cause you to cough   ?               No    Neurogenic/Airway score         Unless coughing not sob  Rec symbicort change to 80 strength and take up to 2 every 12 hours if it's helping  Work on inhaler technique:   First take delsym two tsp every 12 hours and supplement if needed with  tramadol 50 mg up to 1 every 4 hours to suppress the urge to cough at all or even clear your throat.  Prednisone 10 mg take  4 each am x 2 days,   2 each am x 2 days,  1 each am x 2 days and stop (this is to eliminate allergies and inflammation from  coughing) Protonix (pantoprazole) Take 30-60 min before first meal of the day and Pepcid 20 mg one bedtime plus chlorpheniramine 4 mg x 2 at bedtime (both available over the counter)  until cough is completely gone for at least a week without the need for cough suppression GERD diet reviewed, bed blocks       05/28/2021 Re-establish /Pj Zehner re: daily throat clearing 2014 never completely resolved then recurrent severe cough flare p covid Mid Sep 2022 maint on prilosec 20 mg one before supper   Chief Complaint  Patient presents with   Pulmonary Consult    04/19/21 dx with covid- sore throat and cough- tx with antiviral. Cough has lingered since then and now wheezing- pred helps but symptoms return one she completes med. She has been using her albuterol inhaler every 4 hours.   Dyspnea:  nl activities no trouble / not aerobically active  Cough: worse when lies down but present daytime also  / min clear mucus  Sleeping: bed is flat/ on side / does disturb sleep with subj wheezing supine   SABA use: not helping much  02: none  Covid status:  vax x 3  Rec Prednisone 10 mg take  4 each am x 2 days,   2 each am x 2 days,  1 each am x 2 days and stop    The key to effective treatment for your cough is eliminating the non-stop cycle of cough you're stuck in First take delsym two tsp every 12  hours and supplement if needed with  tramadol 50 mg up to 1 every 4 hours    Protonix (pantoprazole) Take 30-60 min before first meal of the day and Pepcid 20 mg one bedtime plus chlorpheniramine 4 mg x 2 at bedtime (both available over the counter)  until cough is completely gone for at least a week without the need for cough suppression GERD rx If the cough starts back up as you taper the prednisone or need more albuterol then start the symbicort 80 Take 2 puffs first thing in am and then another 2 puffs about 12 hours later.  Work on inhaler technique:   06/11/2021  f/u ov/Jordon Kristiansen re: uacs vs cough variant asthma  maint on symb 80 2bid (50% technique) not taking gerd rx correctly and has only used 9 tramadol total in last 14 days Chief Complaint  Patient presents with   Follow-up    Pt states cough with mucous   Dyspnea:  Not limited by breathing from desired activities   Cough:  more day than noct as takes tramadol at hs and really min mucoid sputum production  Sleeping: does fine once asleep,flat SABA use: not using but thinks it may have helped  02: none  Wheezing bothers her towards bedtime and persisted even on prednisone  Rec Plan A = Automatic = Always=    Symbicort 80 Take 2 puffs first thing in am and then another 2 puffs about 12 hours later.  Work on inhaler technique:   Plan B = Backup (to supplement plan A, not to replace it) for breathing/ wheezing  Only use your albuterol inhaler as a rescue medication For drainage / throat tickle try take CHLORPHENIRAMINE  4 mg   If A and B aren't working > Prednisone 10 mg take  4 each am x 2 days,   2 each am x 2 days,  1 each am x 2 days and stop  Keep your previous appointment  08/06/2021  f/u ov/Viktor Philipp re: asthma/uacs/ flare while in [place] exp to Perfume   maint on symbort 80 2bid / 1st gen H1 blockers/ gerd rx/ never took prednisone as above Chief Complaint  Patient presents with   Follow-up    Just returned home from [place]- c/o  minimal cough and wheezing. Her cough is prod with clear sputum.    Dyspnea:  Not limited by breathing from desired activities   Cough: dry/better p saba/ never really stopped throat clearing p last ov per husband Sleeping: flat bed/ on side no resp cc SABA use:  tid ? If really helps  02: none  Covid status:vax x 4  Rec Prednisone 10 mg take  4 each am x 2 days,   2 each am x 2 days,  1 each am x 2 days and stop  Take delsym two tsp every 12 hours and supplement if needed with  tramadol 50 mg up to 2 every 4 hours to suppress the urge to cough. Once you have eliminated the cough for 3 straight days try reducing the tramadol first,  then the delsym as tolerated.   Gabapentin 100 mg three times daily     10/04/2021  f/u ov/Keirsten Matuska re: cough variant / uacs  maint on gerd rx/ h1 and symb 80   Chief Complaint  Patient presents with   Follow-up    Cough and wheezing have resolved. Breathing is doing well. No new co's.  Dyspnea:  cleaning neighborhood and walking daily / no aerobics  Cough: gone  Sleeping: fine flat SABA use: none  02: none  Covid status:   vax x 4    No obvious day to day or daytime variability or assoc excess/ purulent sputum or mucus plugs or hemoptysis or cp or chest tightness, subjective wheeze or overt sinus or hb symptoms.   Sleeping  without nocturnal  or early am exacerbation  of respiratory  c/o's or need for noct saba. Also denies any obvious fluctuation of symptoms with weather or environmental changes or other aggravating or alleviating factors except as outlined above   No unusual exposure hx or h/o childhood pna/ asthma or knowledge of premature birth.  Current Allergies, Complete Past Medical History, Past Surgical History, Family History, and Social History were reviewed in [employer] record.  ROS  The following are not active complaints unless bolded Hoarseness, sore throat, dysphagia, dental problems, itching, sneezing,  nasal  congestion or discharge of excess mucus or purulent secretions, ear ache,   fever, chills, sweats, unintended wt loss or wt gain, classically pleuritic or exertional cp,  orthopnea pnd or arm/hand swelling  or leg swelling, presyncope, palpitations, abdominal pain, anorexia, nausea, vomiting, diarrhea  or change in bowel habits or change in bladder habits, change in stools or change in urine, dysuria, hematuria,  rash, arthralgias, visual complaints, headache, numbness, weakness or ataxia or problems with walking or coordination,  change in mood or  memory.        Current Meds  Medication Sig   albuterol (VENTOLIN HFA) 108 (90 Base) MCG/ACT inhaler Inhale 2 puffs into the lungs every 4 (four) hours as needed for wheezing or shortness of breath.   budesonide-formoterol (SYMBICORT) 80-4.5 MCG/ACT inhaler Take 2 puffs first thing in am and then another 2 puffs about 12 hours later.   chlorpheniramine (CHLOR-TRIMETON) 4 MG tablet Take 4 mg by mouth every 4 (four) hours as needed for allergies.   famotidine (PEPCID) 20 MG tablet One after  supper   losartan (COZAAR) 25 MG tablet Take 50 mg by mouth daily. Pt takes 25 mg 1/2 tab in the AM and 25 1/2 tab in the PM.   pantoprazole (PROTONIX) 40 MG tablet TAKE 1 TABLET (40 MG TOTAL) BY MOUTH DAILY. TAKE 30-60 MIN BEFORE FIRST MEAL OF THE DAY   rosuvastatin (CRESTOR) 10 MG tablet Take 1 tablet (10 mg total) by mouth daily.                    Objective:   Physical Exam   wts  10/04/2021         159  08/06/2021         161 06/11/2021     155  05/28/2021     157   12/19/17 153 lb (69.4 kg)  12/11/17 157 lb (71.2 kg)  11/23/17 158 lb 12.8 oz (72 kg)      Vital signs reviewed  10/04/2021  - Note at rest 02 sats  99% on RA   General appearance:    amb pleasant [place] female nad    HEENT : pt wearing mask not removed for exam due to covid -19 concerns.    NECK :  without JVD/Nodes/TM/ nl carotid upstrokes bilaterally   LUNGS: no acc muscle use,   Nl contour chest which is clear to A and P bilaterally without cough on insp or exp maneuvers   CV:  RRR  no s3 or murmur or increase in P2, and no edema   ABD:  soft and nontender with nl inspiratory excursion in the supine position. No bruits or organomegaly appreciated, bowel sounds nl  MS:  Nl gait/ ext warm without deformities, calf tenderness, cyanosis or clubbing No obvious joint restrictions   SKIN: warm and dry without lesions    NEURO:  alert, approp, nl sensorium with  no motor or cerebellar deficits apparent.       Assessment:

## 2021-10-04 NOTE — Patient Instructions (Addendum)
Omeprazole can be resumed twice daily as you were before  ? ?Stop pantoprazole and famotidine  ? ?Stop symbicort 1st but resume full dose at rist sign resume immediately for a week  ? ? ?After a full week off symbicort >>> For drainage / throat tickle tAS NEEDED take CHLORPHENIRAMINE  4 mg  ("Allergy Relief" '4mg'$   at [facility] should be easiest to find in the blue box usually on bottom shelf)  take one every 4 hours as needed - extremely effective and inexpensive over the counter- may cause drowsiness so start with just a dose or two an hour before bedtime and see how you tolerate it before trying in daytime.   ? ? ? If you are satisfied with your treatment plan,  let your doctor know and he/she can either refill your medications or you can return here when your prescription runs out.   ? ? If in any way you are not 100% satisfied,  please tell us.  If 100% better, tell your friends! ? ?Pulmonary follow up is as needed   ?
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
## 2021-10-04 NOTE — Assessment & Plan Note (Signed)
Onset around 2014 ?FENO 12/19/2017  = 12 on singulair  symb 160 2bid but none on day of ov and hfa baseline quite poor  ?- 12/19/2017  After extensive coaching inhaler device  effectiveness =    50% from a baseline of 0> try symb 80 2bid x 2 week sample   ?- Allergy profile 12/19/2017 >  Eos 0.1 /  IgE  44 RAST neg ?- 06/11/2021    continue symbicort 80 2bid  ?- 08/06/2021  After extensive coaching inhaler device,  effectiveness =    80%  ?-  08/06/2021  Gabapentin 100 mg three times daily >>> could not tolerate  ?- 10/04/2021 rec try off symbicort then h1 blocker  ? ?No evidence of chronic asthma here so ok to use symbicort prn  Based on two studies from NEJM  378; 20 p 1865 (2018) and 380 : p2020-30 (2019) in pts with mild asthma it is reasonable to use low dose symbicort eg 80 2bid "prn" flare in this setting but I emphasized this was only shown with symbicort and takes advantage of the rapid onset of action but is not the same as "rescue therapy" but can be stopped once the acute symptoms have resolved and the need for rescue has been minimized (< 2 x weekly)   ? ?Pulmonary f/u is prn ? ?    ?  ? ?Each maintenance medication was reviewed in detail including emphasizing most importantly the difference between maintenance and prns and under what circumstances the prns are to be triggered using an action plan format where appropriate. ? ?Total time for H and P, chart review, counseling, reviewing hfa device(s) and generating customized AVS unique to this office visit / same day charting  22 min ?     ?

## 2021-10-05 ENCOUNTER — Encounter: Payer: Self-pay | Admitting: Internal Medicine

## 2021-10-05 ENCOUNTER — Ambulatory Visit: Payer: BC Managed Care – PPO | Admitting: Internal Medicine

## 2021-10-05 ENCOUNTER — Ambulatory Visit (INDEPENDENT_AMBULATORY_CARE_PROVIDER_SITE_OTHER): Payer: BC Managed Care – PPO

## 2021-10-05 VITALS — BP 124/68 | HR 100 | Temp 98.5°F | Ht 59.0 in | Wt 161.2 lb

## 2021-10-05 DIAGNOSIS — I700 Atherosclerosis of aorta: Secondary | ICD-10-CM | POA: Diagnosis not present

## 2021-10-05 DIAGNOSIS — I2510 Atherosclerotic heart disease of native coronary artery without angina pectoris: Secondary | ICD-10-CM

## 2021-10-05 DIAGNOSIS — I2584 Coronary atherosclerosis due to calcified coronary lesion: Secondary | ICD-10-CM | POA: Diagnosis not present

## 2021-10-05 DIAGNOSIS — Z79899 Other long term (current) drug therapy: Secondary | ICD-10-CM | POA: Diagnosis not present

## 2021-10-05 DIAGNOSIS — I10 Essential (primary) hypertension: Secondary | ICD-10-CM

## 2021-10-05 DIAGNOSIS — M542 Cervicalgia: Secondary | ICD-10-CM

## 2021-10-05 LAB — EXERCISE TOLERANCE TEST
Angina Index: 0
Duke Treadmill Score: 4
Estimated workload: 5.8
Exercise duration (min): 4 min
Exercise duration (sec): 0 s
MPHR: 158 {beats}/min
Peak HR: 146 {beats}/min
Percent HR: 92 %
RPE: 15
Rest HR: 93 {beats}/min
ST Depression (mm): 0 mm

## 2021-10-05 MED ORDER — LOSARTAN POTASSIUM 50 MG PO TABS: 25.0000 mg | ORAL_TABLET | Freq: Two times a day (BID) | ORAL | 2 refills | Status: DC

## 2021-10-05 NOTE — Patient Instructions (Addendum)
I think  this is musculoskeletal pain .  And aggravated by work positioning.  ? ?Consider frequent stretching  and positional changes   ?Consider   work place station  assessment if on going .  ? ?Consider PT for postural or other help but at t his point think  you can address yourself.  ? ?Refill losartan today . ? ? ?

## 2021-10-13 ENCOUNTER — Telehealth: Payer: Self-pay | Admitting: *Deleted

## 2021-10-13 MED ORDER — CARVEDILOL 6.25 MG PO TABS: 6.2500 mg | ORAL_TABLET | Freq: Two times a day (BID) | ORAL | 3 refills | Status: DC

## 2021-10-13 NOTE — Telephone Encounter (Signed)
I spoke with patient and reviewed stress test results and recommendations from Dr Irish Lack with her. ?Coreg prescription [facility] ?

## 2021-10-13 NOTE — Telephone Encounter (Signed)
Julia Booze, MD  Thompson Grayer, RN ?Actually, lets add Coreg 6.25 mg BID instead of metoprolol.  ? ?JV  ?

## 2021-10-18 ENCOUNTER — Encounter: Payer: Self-pay | Admitting: Interventional Cardiology

## 2021-10-30 IMAGING — MG DIGITAL SCREENING BILAT W/ TOMO W/ CAD
8 series · 8 of 24 positions shown · non-contrast
Comparison: Previous exam(s).

ACR Breast Density Category a: The breast tissue is almost entirely
fatty.

CLINICAL DATA: Screening.

EXAM:
DIGITAL SCREENING BILATERAL MAMMOGRAM WITH TOMO AND CAD

[L MLO synth-2D]
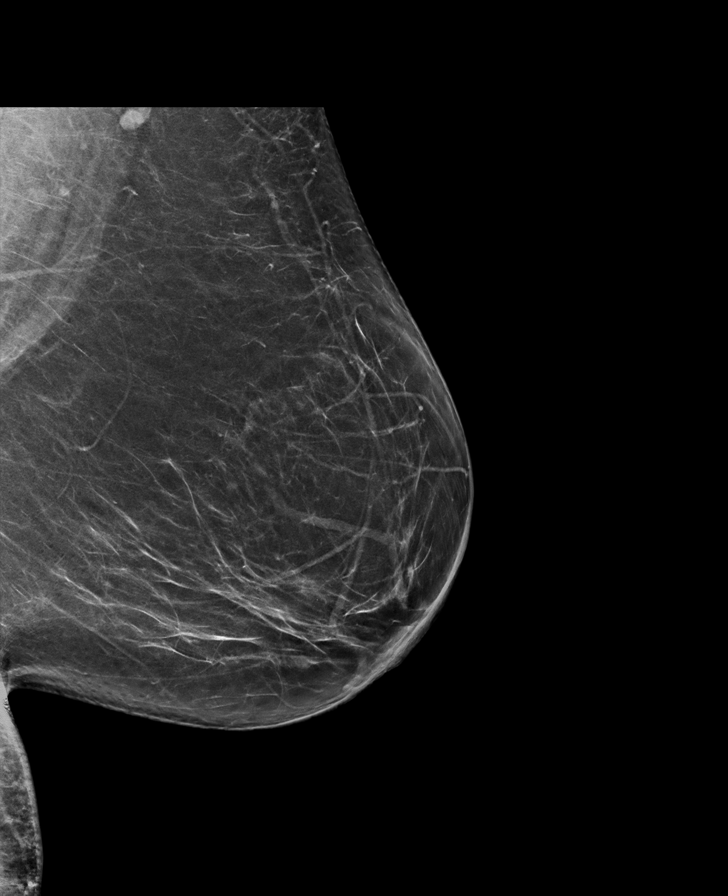

[L CC synth-2D]
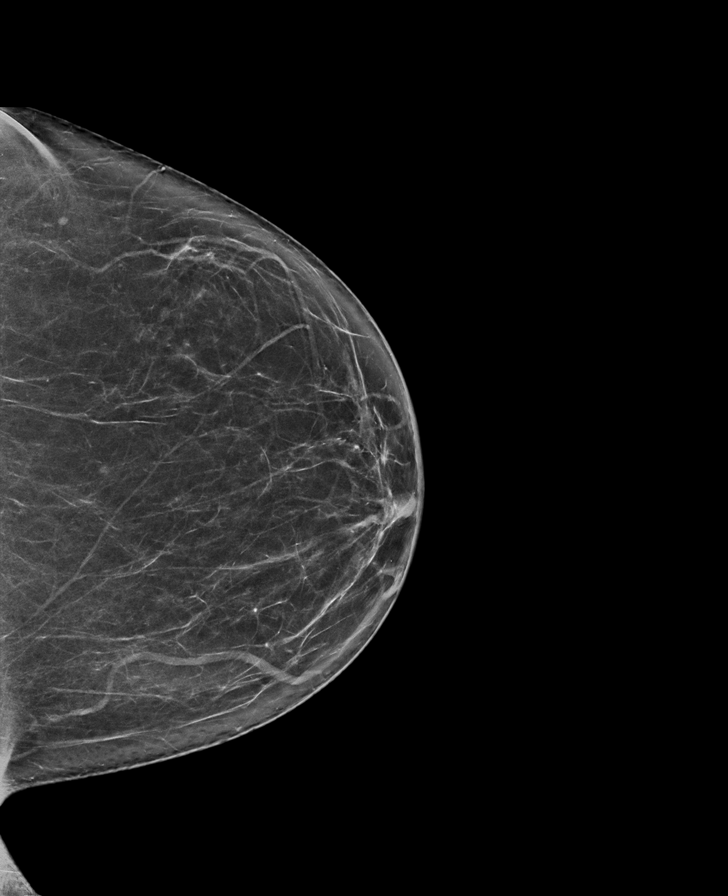

[R CC synth-2D]
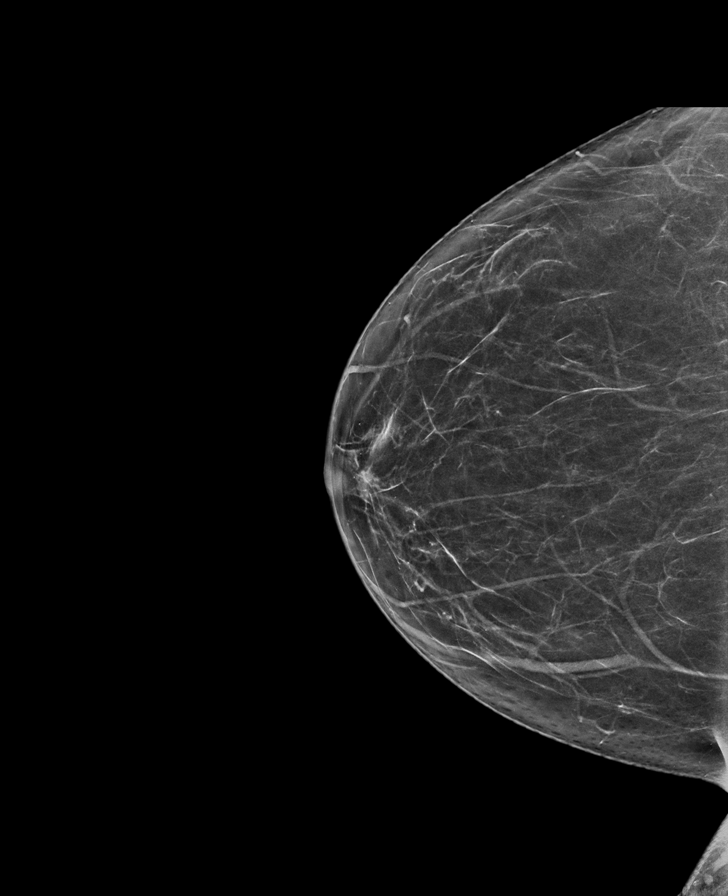

[R MLO synth-2D]
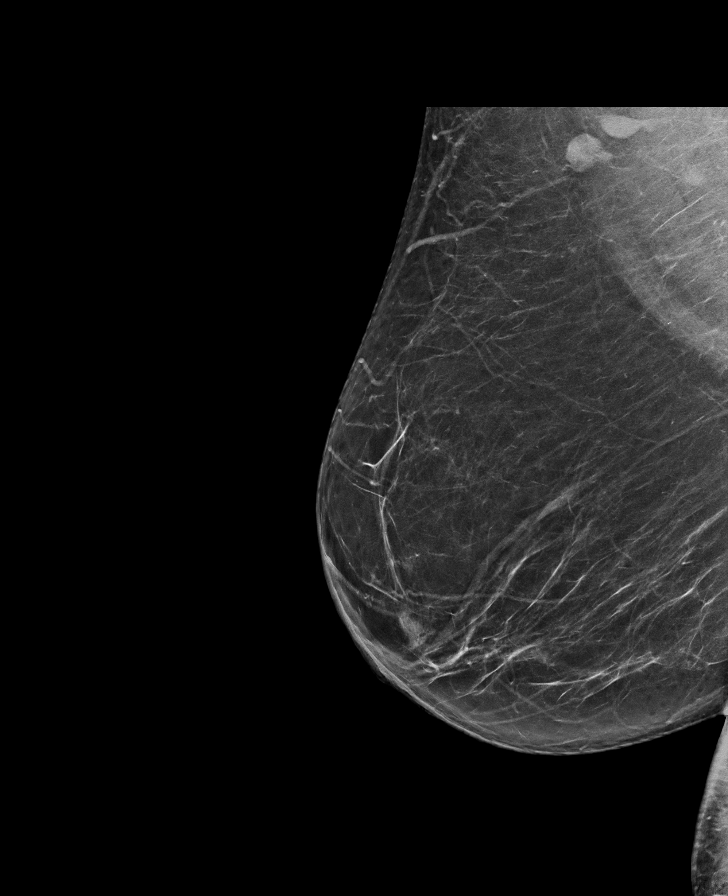

[R MLO tomo · tomo slice 39/78.0]
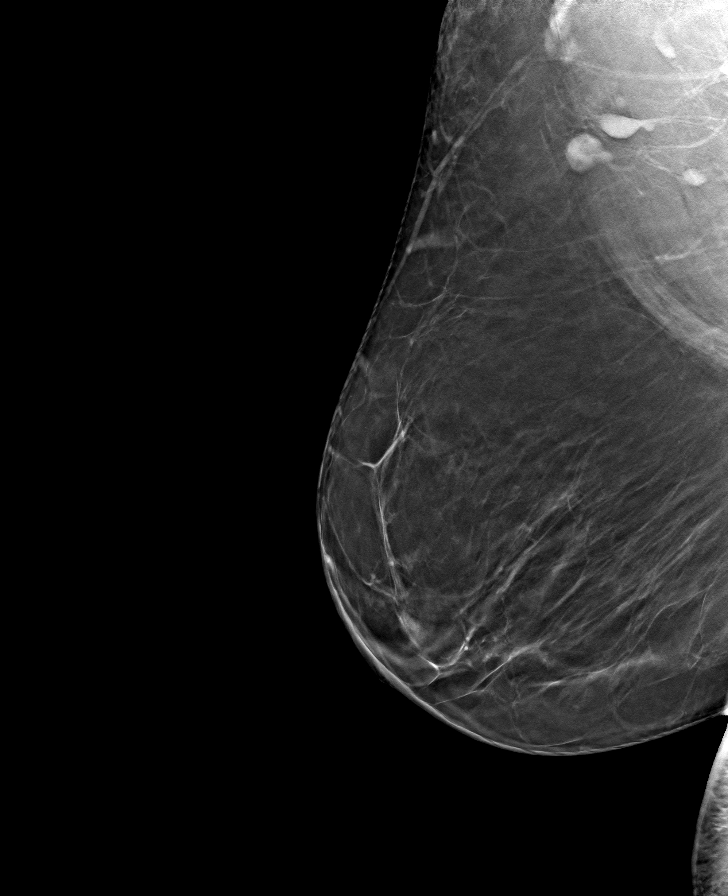

[L MLO tomo · tomo slice 41/81.0]
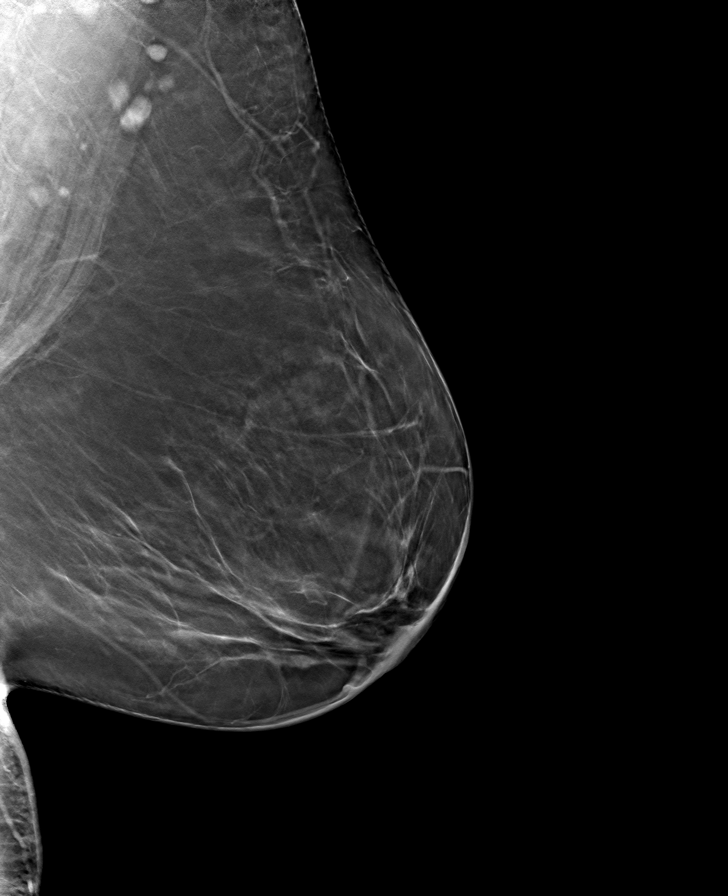

[R CC tomo · tomo slice 35/70.0]
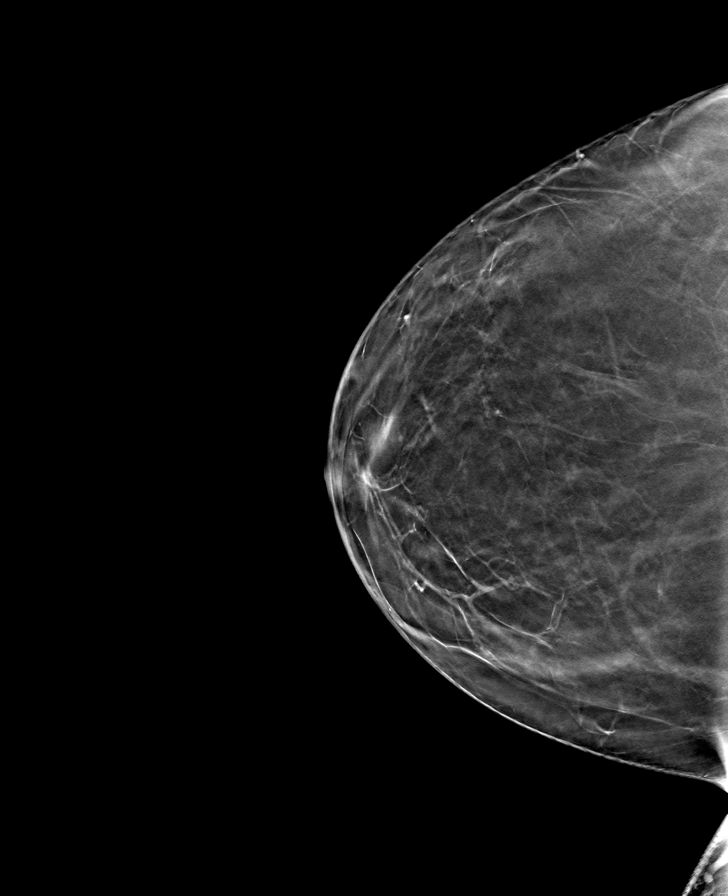

[L CC tomo · tomo slice 37/74.0]
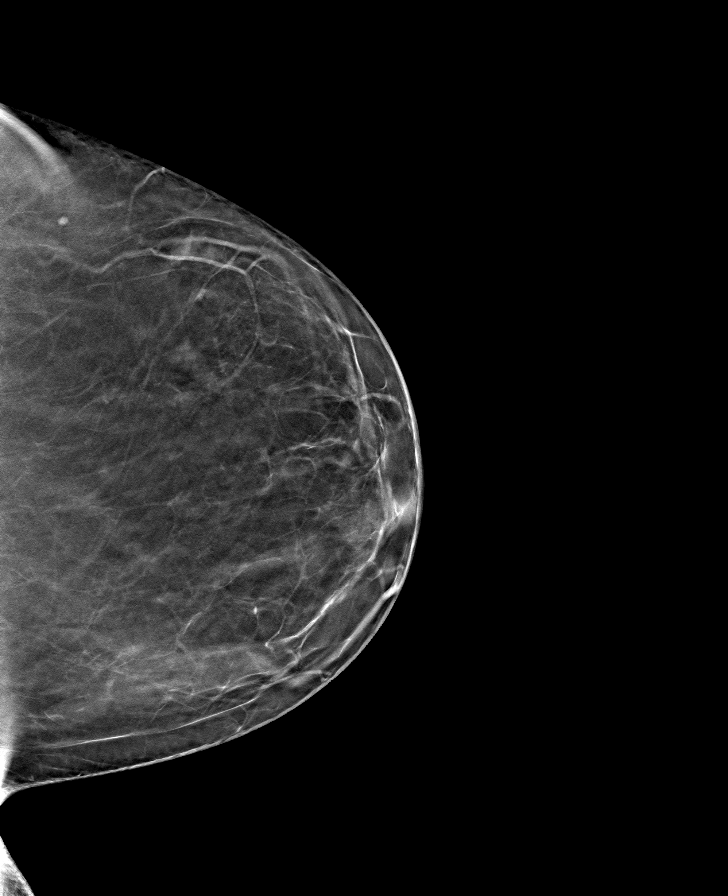

[8 of 24 positions shown; findings below may reference images not displayed]

FINDINGS: There are no findings suspicious for malignancy. Images were
processed with CAD.
IMPRESSION: No mammographic evidence of malignancy. A result letter of this
screening mammogram will be mailed directly to the patient.

RECOMMENDATION:
Screening mammogram in one year. (Code:8Y-[id number])

BI-RADS CATEGORY  1: Negative.

## 2021-11-02 ENCOUNTER — Ambulatory Visit (INDEPENDENT_AMBULATORY_CARE_PROVIDER_SITE_OTHER): Payer: BC Managed Care – PPO

## 2021-11-02 ENCOUNTER — Other Ambulatory Visit: Payer: Self-pay | Admitting: Obstetrics & Gynecology

## 2021-11-02 DIAGNOSIS — Z780 Asymptomatic menopausal state: Secondary | ICD-10-CM

## 2021-11-02 DIAGNOSIS — M85852 Other specified disorders of bone density and structure, left thigh: Secondary | ICD-10-CM

## 2021-11-02 DIAGNOSIS — M85851 Other specified disorders of bone density and structure, right thigh: Secondary | ICD-10-CM

## 2021-11-10 ENCOUNTER — Encounter: Payer: Self-pay | Admitting: Interventional Cardiology

## 2021-11-10 ENCOUNTER — Telehealth: Payer: Self-pay | Admitting: *Deleted

## 2021-11-10 ENCOUNTER — Encounter: Payer: Self-pay | Admitting: Internal Medicine

## 2021-11-10 NOTE — Telephone Encounter (Signed)
Pt agreeable to plan of care of tele pre op appt 11/15/21 @ 3 pm. Med rec and consent are done.  ?

## 2021-11-10 NOTE — Telephone Encounter (Signed)
[place], MD ? ?Chart reviewed as part of pre-operative protocol coverage. Because of [name]'s past medical history and time since last visit, he/she will require a virtual visit/telephone call in order to better assess preoperative cardiovascular risk. ? ?Pre-op covering staff: ?- Please contact patient, obtain consent, and schedule appointment  ? ?Emmaline Life, NP-C ? ?  ?11/10/2021, 11:58 AM ?[facility] ?[address] ?Office [phone] Fax [phone] ? ?

## 2021-11-10 NOTE — Telephone Encounter (Signed)
Pt agreeable to plan of care of tele pre op appt 11/15/21 @ 3 pm. Med rec and consent are done.  ? ?  ?Patient Consent for Virtual Visit  ? ? ?   ? ?[name] has provided verbal consent on 11/10/2021 for a virtual visit (video or telephone). ? ? ?CONSENT FOR VIRTUAL VISIT FOR:  [name]  ?By participating in this virtual visit I agree to the following: ? ?I hereby voluntarily request, consent and authorize [facility] and its employed or contracted physicians, physician assistants, nurse practitioners or other licensed health care professionals (the Practitioner), to provide me with telemedicine health care services (the ?Services") as deemed necessary by the treating Practitioner. I acknowledge and consent to receive the Services by the Practitioner via telemedicine. I understand that the telemedicine visit will involve communicating with the Practitioner through live audiovisual communication technology and the disclosure of certain medical information by electronic transmission. I acknowledge that I have been given the opportunity to request an in-person assessment or other available alternative prior to the telemedicine visit and am voluntarily participating in the telemedicine visit. ? ?I understand that I have the right to withhold or withdraw my consent to the use of telemedicine in the course of my care at any time, without affecting my right to future care or treatment, and that the Practitioner or I may terminate the telemedicine visit at any time. I understand that I have the right to inspect all information obtained and/or recorded in the course of the telemedicine visit and may receive copies of available information for a reasonable fee.  I understand that some of the potential risks of receiving the Services via telemedicine include:  ?Delay or interruption in medical evaluation due to technological equipment failure or disruption; ?Information transmitted may not be sufficient  (e.g. poor resolution of images) to allow for appropriate medical decision making by the Practitioner; and/or  ?In rare instances, security protocols could fail, causing a breach of personal health information. ? ?Furthermore, I acknowledge that it is my responsibility to provide information about my medical history, conditions and care that is complete and accurate to the best of my ability. I acknowledge that Practitioner's advice, recommendations, and/or decision may be based on factors not within their control, such as incomplete or inaccurate data provided by me or distortions of diagnostic images or specimens that may result from electronic transmissions. I understand that the practice of medicine is not an exact science and that Practitioner makes no warranties or guarantees regarding treatment outcomes. I acknowledge that a copy of this consent can be made available to me via my patient portal ([facility]), or I can request a printed copy by calling the office of [facility].   ? ?I understand that my insurance will be billed for this visit.  ? ?I have read or had this consent read to me. ?I understand the contents of this consent, which adequately explains the benefits and risks of the Services being provided via telemedicine.  ?I have been provided ample opportunity to ask questions regarding this consent and the Services and have had my questions answered to my satisfaction. ?I give my informed consent for the services to be provided through the use of telemedicine in my medical care ? ? ? ?

## 2021-11-15 ENCOUNTER — Ambulatory Visit (INDEPENDENT_AMBULATORY_CARE_PROVIDER_SITE_OTHER): Payer: BC Managed Care – PPO | Admitting: Physician Assistant

## 2021-11-15 ENCOUNTER — Encounter: Payer: Self-pay | Admitting: Physician Assistant

## 2021-11-15 DIAGNOSIS — Z01810 Encounter for preprocedural cardiovascular examination: Secondary | ICD-10-CM
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
## 2021-11-17 ENCOUNTER — Ambulatory Visit (INDEPENDENT_AMBULATORY_CARE_PROVIDER_SITE_OTHER): Payer: BC Managed Care – PPO

## 2021-11-17 ENCOUNTER — Encounter: Payer: Self-pay | Admitting: Family Medicine

## 2021-11-17 ENCOUNTER — Ambulatory Visit: Payer: BC Managed Care – PPO | Admitting: Family Medicine

## 2021-11-17 VITALS — BP 128/80 | HR 97 | Resp 16 | Ht 59.0 in | Wt 160.4 lb

## 2021-11-17 DIAGNOSIS — M25571 Pain in right ankle and joints of right foot: Secondary | ICD-10-CM | POA: Diagnosis not present

## 2021-11-17 DIAGNOSIS — R600 Localized edema: Secondary | ICD-10-CM | POA: Diagnosis not present

## 2021-11-17 DIAGNOSIS — S93491A Sprain of other ligament of right ankle, initial encounter: Secondary | ICD-10-CM | POA: Diagnosis not present

## 2021-11-17 MED ORDER — CELECOXIB 100 MG PO CAPS: 100.0000 mg | ORAL_CAPSULE | Freq: Two times a day (BID) | ORAL | 0 refills | Status: AC
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
## 2021-11-17 NOTE — Patient Instructions (Signed)
A few things to remember from today's visit: ? ? ?Acute right ankle pain - Plan: DG Ankle Complete Right ? ?Sprain of other ligament of right ankle, initial encounter - Plan: celecoxib (CELEBREX) 100 MG capsule ? ?If you need refills please call your pharmacy. ?Do not use My Chart to request refills or for acute issues that need immediate attention. ?  ?Celebrex is similar to Ibuprofen but more gentile with your stomach. Take med 2 times daily for 7-10 days. ?ABC exercises a few times during the day. ? ?Please be sure medication list is accurate. ?If a new problem present, please set up appointment sooner than planned today. ? ? ? ? ? ? ? ?

## 2021-11-18 ENCOUNTER — Telehealth: Payer: Self-pay | Admitting: Internal Medicine

## 2021-11-18 ENCOUNTER — Encounter: Payer: Self-pay | Admitting: Family Medicine

## 2021-11-18 NOTE — Telephone Encounter (Signed)
Pt states pharmacy told her that she needed to have the doctor explain why she is being prescribed celecoxib. Advised pt that PA is needed. Pt states she would like to hold on the PA & try OTC meds as she has not done so for the reason she saw Dr Martinique yesterday. Pt aware that the Celecoxib is typically stronger than OTC meds, but prefers to try OTC now & update Dr Martinique if she would like to proceeds with PA or she will contact her insurance company for covered alternatives. Pt also advised that she will be contacted by Dr Martinique with an interpretation of xray results even though she might see them before Dr Martinique. ?Pt verb understanding. ?

## 2021-11-18 NOTE — Telephone Encounter (Signed)
Patient called in due not being able to pick up RX... Pharmacy is requesting the Provider call them to explain why this particular medication  ?

## 2021-11-22 ENCOUNTER — Ambulatory Visit: Payer: BC Managed Care – PPO | Admitting: Internal Medicine

## 2021-11-29 DIAGNOSIS — D329 Benign neoplasm of meninges, unspecified: Secondary | ICD-10-CM | POA: Insufficient documentation

## 2021-12-13 ENCOUNTER — Other Ambulatory Visit: Payer: BC Managed Care – PPO | Admitting: *Deleted

## 2021-12-13 DIAGNOSIS — I700 Atherosclerosis of aorta: Secondary | ICD-10-CM | POA: Diagnosis not present

## 2021-12-13 DIAGNOSIS — I2510 Atherosclerotic heart disease of native coronary artery without angina pectoris: Secondary | ICD-10-CM

## 2021-12-13 DIAGNOSIS — I2584 Coronary atherosclerosis due to calcified coronary lesion: Secondary | ICD-10-CM | POA: Diagnosis not present

## 2021-12-13 LAB — HEPATIC FUNCTION PANEL
ALT: 18 IU/L (ref 0–32)
AST: 23 IU/L (ref 0–40)
Albumin: 4.5 g/dL (ref 3.8–4.8)
Alkaline Phosphatase: 83 IU/L (ref 44–121)
Bilirubin Total: 0.4 mg/dL (ref 0.0–1.2)
Bilirubin, Direct: 0.12 mg/dL (ref 0.00–0.40)
Total Protein: 7 g/dL (ref 6.0–8.5)

## 2021-12-13 LAB — LIPID PANEL
Chol/HDL Ratio: 2 ratio (ref 0.0–4.4)
Cholesterol, Total: 132 mg/dL (ref 100–199)
HDL: 65 mg/dL (ref >39)
LDL Chol Calc (NIH): 51 mg/dL (ref 0–99)
Triglycerides: 84 mg/dL (ref 0–149)
VLDL Cholesterol Cal: 16 mg/dL (ref 5–40)

## 2021-12-20 ENCOUNTER — Encounter: Payer: Self-pay | Admitting: Internal Medicine

## 2021-12-21 DIAGNOSIS — Z87891 Personal history of nicotine dependence: Secondary | ICD-10-CM | POA: Diagnosis not present

## 2021-12-21 DIAGNOSIS — Z01818 Encounter for other preprocedural examination: Secondary | ICD-10-CM | POA: Diagnosis not present

## 2021-12-21 DIAGNOSIS — D72829 Elevated white blood cell count, unspecified: Secondary | ICD-10-CM | POA: Diagnosis not present

## 2021-12-21 DIAGNOSIS — Z886 Allergy status to analgesic agent status: Secondary | ICD-10-CM | POA: Diagnosis not present

## 2021-12-21 DIAGNOSIS — J3489 Other specified disorders of nose and nasal sinuses: Secondary | ICD-10-CM | POA: Diagnosis not present

## 2021-12-21 DIAGNOSIS — K219 Gastro-esophageal reflux disease without esophagitis: Secondary | ICD-10-CM | POA: Diagnosis not present

## 2021-12-21 DIAGNOSIS — I10 Essential (primary) hypertension: Secondary | ICD-10-CM | POA: Diagnosis not present

## 2021-12-21 DIAGNOSIS — J45909 Unspecified asthma, uncomplicated: Secondary | ICD-10-CM | POA: Diagnosis not present

## 2021-12-21 DIAGNOSIS — I2510 Atherosclerotic heart disease of native coronary artery without angina pectoris: Secondary | ICD-10-CM | POA: Diagnosis not present

## 2021-12-21 DIAGNOSIS — D320 Benign neoplasm of cerebral meninges: Secondary | ICD-10-CM | POA: Diagnosis not present

## 2021-12-21 DIAGNOSIS — D329 Benign neoplasm of meninges, unspecified: Secondary | ICD-10-CM | POA: Diagnosis not present

## 2021-12-21 DIAGNOSIS — Z79899 Other long term (current) drug therapy: Secondary | ICD-10-CM | POA: Diagnosis not present

## 2021-12-21 DIAGNOSIS — I700 Atherosclerosis of aorta: Secondary | ICD-10-CM | POA: Diagnosis not present

## 2021-12-21 DIAGNOSIS — E785 Hyperlipidemia, unspecified: Secondary | ICD-10-CM | POA: Diagnosis not present

## 2021-12-21 DIAGNOSIS — M8580 Other specified disorders of bone density and structure, unspecified site: Secondary | ICD-10-CM | POA: Diagnosis not present

## 2021-12-21 DIAGNOSIS — M852 Hyperostosis of skull: Secondary | ICD-10-CM | POA: Diagnosis not present

## 2021-12-22 DIAGNOSIS — D329 Benign neoplasm of meninges, unspecified: Secondary | ICD-10-CM

## 2021-12-22 HISTORY — DX: Benign neoplasm of meninges, unspecified: D32.9

## 2021-12-26 DIAGNOSIS — E785 Hyperlipidemia, unspecified: Secondary | ICD-10-CM | POA: Diagnosis not present

## 2021-12-26 DIAGNOSIS — I700 Atherosclerosis of aorta: Secondary | ICD-10-CM | POA: Diagnosis not present

## 2021-12-26 DIAGNOSIS — R220 Localized swelling, mass and lump, head: Secondary | ICD-10-CM | POA: Diagnosis not present

## 2021-12-26 DIAGNOSIS — J45909 Unspecified asthma, uncomplicated: Secondary | ICD-10-CM | POA: Diagnosis not present

## 2021-12-26 DIAGNOSIS — K219 Gastro-esophageal reflux disease without esophagitis: Secondary | ICD-10-CM | POA: Diagnosis not present

## 2021-12-26 DIAGNOSIS — Z9889 Other specified postprocedural states: Secondary | ICD-10-CM | POA: Diagnosis not present

## 2021-12-26 DIAGNOSIS — Z87891 Personal history of nicotine dependence: Secondary | ICD-10-CM | POA: Diagnosis not present

## 2021-12-26 DIAGNOSIS — F429 Obsessive-compulsive disorder, unspecified: Secondary | ICD-10-CM | POA: Diagnosis not present

## 2021-12-26 DIAGNOSIS — I10 Essential (primary) hypertension: Secondary | ICD-10-CM | POA: Diagnosis not present

## 2021-12-26 DIAGNOSIS — Z86011 Personal history of benign neoplasm of the brain: Secondary | ICD-10-CM | POA: Diagnosis not present

## 2021-12-28 ENCOUNTER — Encounter: Payer: BC Managed Care – PPO | Admitting: Internal Medicine
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
## 2021-12-29 ENCOUNTER — Encounter: Payer: Self-pay | Admitting: Internal Medicine

## 2021-12-29 ENCOUNTER — Ambulatory Visit: Payer: BC Managed Care – PPO | Admitting: Internal Medicine

## 2021-12-29 VITALS — BP 124/80 | HR 78 | Temp 98.3°F | Ht 59.0 in | Wt 154.6 lb

## 2021-12-29 DIAGNOSIS — G4720 Circadian rhythm sleep disorder, unspecified type: Secondary | ICD-10-CM | POA: Diagnosis not present

## 2021-12-29 DIAGNOSIS — Z79899 Other long term (current) drug therapy: Secondary | ICD-10-CM

## 2021-12-29 DIAGNOSIS — Z9889 Other specified postprocedural states: Secondary | ICD-10-CM | POA: Diagnosis not present

## 2021-12-29 DIAGNOSIS — F518 Other sleep disorders not due to a substance or known physiological condition: Secondary | ICD-10-CM

## 2021-12-30 ENCOUNTER — Encounter: Payer: Self-pay | Admitting: Internal Medicine

## 2022-01-05 DIAGNOSIS — Z4802 Encounter for removal of sutures: Secondary | ICD-10-CM | POA: Diagnosis not present

## 2022-01-26 DIAGNOSIS — D320 Benign neoplasm of cerebral meninges: Secondary | ICD-10-CM | POA: Diagnosis not present

## 2022-01-26 DIAGNOSIS — D329 Benign neoplasm of meninges, unspecified: Secondary | ICD-10-CM | POA: Diagnosis not present

## 2022-02-08 DIAGNOSIS — D329 Benign neoplasm of meninges, unspecified: Secondary | ICD-10-CM | POA: Diagnosis not present

## 2022-02-21 ENCOUNTER — Other Ambulatory Visit: Payer: Self-pay | Admitting: Internal Medicine

## 2022-02-21 DIAGNOSIS — J45991 Cough variant asthma: Secondary | ICD-10-CM

## 2022-03-23 ENCOUNTER — Ambulatory Visit (INDEPENDENT_AMBULATORY_CARE_PROVIDER_SITE_OTHER): Payer: BC Managed Care – PPO | Admitting: Internal Medicine

## 2022-03-23 ENCOUNTER — Encounter: Payer: Self-pay | Admitting: Internal Medicine

## 2022-03-23 VITALS — BP 136/78 | HR 82 | Temp 97.9°F | Wt 159.2 lb

## 2022-03-23 DIAGNOSIS — R5383 Other fatigue: Secondary | ICD-10-CM

## 2022-03-23 DIAGNOSIS — M25511 Pain in right shoulder: Secondary | ICD-10-CM

## 2022-03-23 DIAGNOSIS — R635 Abnormal weight gain: Secondary | ICD-10-CM

## 2022-03-23 DIAGNOSIS — G479 Sleep disorder, unspecified: Secondary | ICD-10-CM

## 2022-03-23 DIAGNOSIS — G2581 Restless legs syndrome: Secondary | ICD-10-CM | POA: Diagnosis not present
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
## 2022-03-23 NOTE — Patient Instructions (Addendum)
  I think inadequate sleep is related to the fatigue.  Lab today .  Shoulder pain,  RLS  ? Need sleep reset.   You  could  take tylenol if shoulder waking you up .   Can consdier adding short term sleeping aid to see if helps .

## 2022-03-24 LAB — BASIC METABOLIC PANEL
BUN: 13 mg/dL (ref 6–23)
CO2: 32 mEq/L (ref 19–32)
Calcium: 9.3 mg/dL (ref 8.4–10.5)
Chloride: 99 mEq/L (ref 96–112)
Creatinine, Ser: 0.8 mg/dL (ref 0.40–1.20)
GFR: 78.42 mL/min (ref >60.00)
Glucose, Bld: 151 mg/dL — ABNORMAL HIGH (ref 70–99)
Potassium: 4.2 mEq/L (ref 3.5–5.1)
Sodium: 141 mEq/L (ref 135–145)

## 2022-03-24 LAB — CBC WITH DIFFERENTIAL/PLATELET
Basophils Absolute: 0.1 10*3/uL (ref 0.0–0.1)
Basophils Relative: 0.6 % (ref 0.0–3.0)
Eosinophils Absolute: 0.2 10*3/uL (ref 0.0–0.7)
Eosinophils Relative: 2.4 % (ref 0.0–5.0)
HCT: 40.6 % (ref 36.0–46.0)
Hemoglobin: 13 g/dL (ref 12.0–15.0)
Lymphocytes Relative: 30.4 % (ref 12.0–46.0)
Lymphs Abs: 3 10*3/uL (ref 0.7–4.0)
MCHC: 32.1 g/dL (ref 30.0–36.0)
MCV: 86.3 fl (ref 78.0–100.0)
Monocytes Absolute: 0.6 10*3/uL (ref 0.1–1.0)
Monocytes Relative: 6.1 % (ref 3.0–12.0)
Neutro Abs: 6 10*3/uL (ref 1.4–7.7)
Neutrophils Relative %: 60.5 % (ref 43.0–77.0)
Platelets: 284 10*3/uL (ref 150.0–400.0)
RBC: 4.7 Mil/uL (ref 3.87–5.11)
RDW: 14.2 % (ref 11.5–15.5)
WBC: 9.9 10*3/uL (ref 4.0–10.5)

## 2022-03-24 LAB — T4, FREE: Free T4: 0.95 ng/dL (ref 0.60–1.60)

## 2022-03-24 LAB — HEPATIC FUNCTION PANEL
ALT: 15 U/L (ref 0–35)
AST: 25 U/L (ref 0–37)
Albumin: 4.1 g/dL (ref 3.5–5.2)
Alkaline Phosphatase: 62 U/L (ref 39–117)
Bilirubin, Direct: 0.1 mg/dL (ref 0.0–0.3)
Total Bilirubin: 0.4 mg/dL (ref 0.2–1.2)
Total Protein: 7.2 g/dL (ref 6.0–8.3)

## 2022-03-24 LAB — IRON,TIBC AND FERRITIN PANEL
%SAT: 13 % (calc) — ABNORMAL LOW (ref 16–45)
Ferritin: 36 ng/mL (ref 16–288)
Iron: 62 ug/dL (ref 45–160)
TIBC: 487 mcg/dL (calc) — ABNORMAL HIGH (ref 250–450)

## 2022-03-24 LAB — TSH: TSH: 1.32 u[IU]/mL (ref 0.35–5.50)

## 2022-03-24 LAB — HEMOGLOBIN A1C: Hgb A1c MFr Bld: 6.6 % — ABNORMAL HIGH (ref 4.6–6.5)

## 2022-03-31 ENCOUNTER — Encounter: Payer: Self-pay | Admitting: Internal Medicine

## 2022-04-05 NOTE — Progress Notes (Signed)
A1c is still in early diabetic range and we may want to add medication in addition to diet and activity changes .  NO anemia but some decrease in iron saturation.  Which could be adding to evening leg symptoms .  I advise add one iron supplement per day ( otc)  for the next 2 -3 months  and then repeat ibc saturation and ferritin levels 9 goal is ferritin above 50 )  Can make a virtual visit fu to discuss possible medication for blood sugar  and answer questions about findings

## 2022-04-11 NOTE — Telephone Encounter (Signed)
Patient seen lab result notes via mychart.

## 2022-05-06 ENCOUNTER — Ambulatory Visit (INDEPENDENT_AMBULATORY_CARE_PROVIDER_SITE_OTHER): Payer: BC Managed Care – PPO

## 2022-05-06 DIAGNOSIS — Z23 Encounter for immunization: Secondary | ICD-10-CM

## 2022-06-07 ENCOUNTER — Other Ambulatory Visit: Payer: Self-pay | Admitting: Internal Medicine

## 2022-06-07 DIAGNOSIS — J45991 Cough variant asthma: Secondary | ICD-10-CM

## 2022-06-26 ENCOUNTER — Other Ambulatory Visit: Payer: Self-pay | Admitting: Internal Medicine

## 2022-07-04 ENCOUNTER — Telehealth: Payer: Self-pay | Admitting: Internal Medicine

## 2022-07-04 NOTE — Telephone Encounter (Signed)
Pt requesting Symbicort and famtidine- patient was last seen on 09/2021 with Dr. Melvyn Novas and was instructed to follow up as needed- patient was told by pharmacy she needs an appointment for refills. Pt is willing to make an appt, but would like to know since she is doing well, does she need one?

## 2022-07-05 MED ORDER — BUDESONIDE-FORMOTEROL FUMARATE 80-4.5 MCG/ACT IN AERO: INHALATION_SPRAY | RESPIRATORY_TRACT | 2 refills | Status: DC

## 2022-07-05 NOTE — Telephone Encounter (Signed)
Spoke with pt  and sent in Symbicort refill and reviewed administration instructions with pt. Pt stated understanding and nothing further needed at this time.

## 2022-07-06 ENCOUNTER — Encounter: Payer: BC Managed Care – PPO | Admitting: Internal Medicine

## 2022-07-20 ENCOUNTER — Encounter: Payer: Self-pay | Admitting: Family

## 2022-07-20 ENCOUNTER — Ambulatory Visit (INDEPENDENT_AMBULATORY_CARE_PROVIDER_SITE_OTHER): Payer: BC Managed Care – PPO | Admitting: Family

## 2022-07-20 VITALS — BP 160/80 | HR 75 | Temp 97.6°F | Ht 59.0 in | Wt 161.6 lb

## 2022-07-20 DIAGNOSIS — E559 Vitamin D deficiency, unspecified: Secondary | ICD-10-CM | POA: Diagnosis not present

## 2022-07-20 DIAGNOSIS — E7800 Pure hypercholesterolemia, unspecified: Secondary | ICD-10-CM

## 2022-07-20 DIAGNOSIS — E663 Overweight: Secondary | ICD-10-CM | POA: Diagnosis not present

## 2022-07-20 DIAGNOSIS — Z0000 Encounter for general adult medical examination without abnormal findings: Secondary | ICD-10-CM

## 2022-07-20 LAB — CBC WITH DIFFERENTIAL/PLATELET
Basophils Absolute: 0.1 10*3/uL (ref 0.0–0.1)
Basophils Relative: 0.7 % (ref 0.0–3.0)
Eosinophils Absolute: 0.2 10*3/uL (ref 0.0–0.7)
Eosinophils Relative: 2.7 % (ref 0.0–5.0)
HCT: 43.3 % (ref 36.0–46.0)
Hemoglobin: 14.6 g/dL (ref 12.0–15.0)
Lymphocytes Relative: 36.3 % (ref 12.0–46.0)
Lymphs Abs: 3.2 10*3/uL (ref 0.7–4.0)
MCHC: 33.8 g/dL (ref 30.0–36.0)
MCV: 86 fl (ref 78.0–100.0)
Monocytes Absolute: 0.7 10*3/uL (ref 0.1–1.0)
Monocytes Relative: 7.8 % (ref 3.0–12.0)
Neutro Abs: 4.7 10*3/uL (ref 1.4–7.7)
Neutrophils Relative %: 52.5 % (ref 43.0–77.0)
Platelets: 300 10*3/uL (ref 150.0–400.0)
RBC: 5.03 Mil/uL (ref 3.87–5.11)
RDW: 14.9 % (ref 11.5–15.5)
WBC: 8.9 10*3/uL (ref 4.0–10.5)

## 2022-07-20 LAB — LIPID PANEL
Cholesterol: 136 mg/dL (ref 0–200)
HDL: 68.2 mg/dL (ref >39.00)
LDL Cholesterol: 49 mg/dL (ref 0–99)
NonHDL: 67.95
Total CHOL/HDL Ratio: 2
Triglycerides: 93 mg/dL (ref 0.0–149.0)
VLDL: 18.6 mg/dL (ref 0.0–40.0)

## 2022-07-20 LAB — VITAMIN D 25 HYDROXY (VIT D DEFICIENCY, FRACTURES): VITD: 74.17 ng/mL (ref 30.00–100.00)

## 2022-07-20 LAB — HEMOGLOBIN A1C: Hgb A1c MFr Bld: 6.7 % — ABNORMAL HIGH (ref 4.6–6.5)

## 2022-07-20 LAB — TSH: TSH: 1.73 u[IU]/mL (ref 0.35–5.50)
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
## 2022-07-20 NOTE — Patient Instructions (Signed)
Health Maintenance, Female Adopting a healthy lifestyle and getting preventive care are important in promoting health and wellness. Ask your health care provider about: The right schedule for you to have regular tests and exams. Things you can do on your own to prevent diseases and keep yourself healthy. What should I know about diet, weight, and exercise? Eat a healthy diet  Eat a diet that includes plenty of vegetables, fruits, low-fat dairy products, and lean protein. Do not eat a lot of foods that are high in solid fats, added sugars, or sodium. Maintain a healthy weight Body mass index (BMI) is used to identify weight problems. It estimates body fat based on height and weight. Your health care provider can help determine your BMI and help you achieve or maintain a healthy weight. Get regular exercise Get regular exercise. This is one of the most important things you can do for your health. Most adults should: Exercise for at least 150 minutes each week. The exercise should increase your heart rate and make you sweat (moderate-intensity exercise). Do strengthening exercises at least twice a week. This is in addition to the moderate-intensity exercise. Spend less time sitting. Even light physical activity can be beneficial. Watch cholesterol and blood lipids Have your blood tested for lipids and cholesterol at 63 years of age, then have this test every 5 years. Have your cholesterol levels checked more often if: Your lipid or cholesterol levels are high. You are older than 63 years of age. You are at high risk for heart disease. What should I know about cancer screening? Depending on your health history and family history, you may need to have cancer screening at various ages. This may include screening for: Breast cancer. Cervical cancer. Colorectal cancer. Skin cancer. Lung cancer. What should I know about heart disease, diabetes, and high blood pressure? Blood pressure and heart  disease High blood pressure causes heart disease and increases the risk of stroke. This is more likely to develop in people who have high blood pressure readings or are overweight. Have your blood pressure checked: Every 3-5 years if you are 18-39 years of age. Every year if you are 40 years old or older. Diabetes Have regular diabetes screenings. This checks your fasting blood sugar level. Have the screening done: Once every three years after age 40 if you are at a normal weight and have a low risk for diabetes. More often and at a younger age if you are overweight or have a high risk for diabetes. What should I know about preventing infection? Hepatitis B If you have a higher risk for hepatitis B, you should be screened for this virus. Talk with your health care provider to find out if you are at risk for hepatitis B infection. Hepatitis C Testing is recommended for: Everyone born from 1945 through 1965. Anyone with known risk factors for hepatitis C. Sexually transmitted infections (STIs) Get screened for STIs, including gonorrhea and chlamydia, if: You are sexually active and are younger than 63 years of age. You are older than 63 years of age and your health care provider tells you that you are at risk for this type of infection. Your sexual activity has changed since you were last screened, and you are at increased risk for chlamydia or gonorrhea. Ask your health care provider if you are at risk. Ask your health care provider about whether you are at high risk for HIV. Your health care provider may recommend a prescription medicine to help prevent HIV   infection. If you choose to take medicine to prevent HIV, you should first get tested for HIV. You should then be tested every 3 months for as long as you are taking the medicine. Pregnancy If you are about to stop having your period (premenopausal) and you may become pregnant, seek counseling before you get pregnant. Take 400 to 800  micrograms (mcg) of folic acid every day if you become pregnant. Ask for birth control (contraception) if you want to prevent pregnancy. Osteoporosis and menopause Osteoporosis is a disease in which the bones lose minerals and strength with aging. This can result in bone fractures. If you are 59 years old or older, or if you are at risk for osteoporosis and fractures, ask your health care provider if you should: Be screened for bone loss. Take a calcium or vitamin D supplement to lower your risk of fractures. Be given hormone replacement therapy (HRT) to treat symptoms of menopause. Follow these instructions at home: Alcohol use Do not drink alcohol if: Your health care provider tells you not to drink. You are pregnant, may be pregnant, or are planning to become pregnant. If you drink alcohol: Limit how much you have to: 0-1 drink a day. Know how much alcohol is in your drink. In the [place] one drink equals one 12 oz bottle of beer (355 mL), one 5 oz glass of wine (148 mL), or one 1 oz glass of hard liquor (44 mL). Lifestyle Do not use any products that contain nicotine or tobacco. These products include cigarettes, chewing tobacco, and vaping devices, such as e-cigarettes. If you need help quitting, ask your health care provider. Do not use street drugs. Do not share needles. Ask your health care provider for help if you need support or information about quitting drugs. General instructions Schedule regular health, dental, and eye exams. Stay current with your vaccines. Tell your health care provider if: You often feel depressed. You have ever been abused or do not feel safe at home. Summary Adopting a healthy lifestyle and getting preventive care are important in promoting health and wellness. Follow your health care provider's instructions about healthy diet, exercising, and getting tested or screened for diseases. Follow your health care provider's instructions on monitoring your  cholesterol and blood pressure. This information is not intended to replace advice given to you by your health care provider. Make sure you discuss any questions you have with your health care provider. Document Revised: 12/07/2020 Document Reviewed: 12/07/2020 Elsevier Patient Education  [place] and Cholesterol Restricted Eating Plan Eating a diet that limits fat and cholesterol may help lower your risk for heart disease and other conditions. Your body needs fat and cholesterol for basic functions, but eating too much of these things can be harmful to your health. Your health care provider may order lab tests to check your blood fat (lipid) and cholesterol levels. This helps your health care provider understand your risk for certain conditions and whether you need to make diet changes. Work with your health care provider or dietitian to make an eating plan that is right for you. Your plan includes: Limit your fat intake to ______% or less of your total calories a day. This is ______g of fat per day. Limit your saturated fat intake to ______% or less of your total calories a day. This is ______g of saturated fat per day. Limit the amount of cholesterol in your diet to less than _________mg a day. Eat ___________ g of fiber  a day. What are tips for following this plan? General guidelines If you are overweight, work with your health care provider to lose weight safely. Losing just 5-10% of your body weight can improve your overall health and help prevent diseases such as diabetes and heart disease. Avoid: Foods with added sugar. Fried foods. Foods that contain partially hydrogenated oils, including stick margarine, some tub margarines, cookies, crackers, and other baked goods. If you drink alcohol: Limit how much you have to: 0-1 drink a day for women who are not pregnant. 0-2 drinks a day for men. Know how much alcohol is in a drink. In the [place] one drink equals one 12 oz  bottle of beer (355 mL), one 5 oz glass of wine (148 mL), or one 1 oz glass of hard liquor (44 mL). Reading food labels Check food labels for: Trans fats or partially hydrogenated oils. Avoid foods that contain these. High amounts of saturated fat. Choose foods that are low in saturated fat (less than 2 g). The amount of cholesterol in each serving. The amount of fiber in each serving. Choose foods with healthy fats, such as: Monounsaturated and polyunsaturated fats. These include olive and canola oil, flaxseeds, walnuts, almonds, and seeds. Omega-3 fats. These are found in foods such as salmon, mackerel, sardines, tuna, flaxseed oil, and ground flaxseeds. Choose grain products that have whole grains. Look for the word "whole" as the first word in the ingredient list. Cooking Cook foods using methods other than frying. Baking, boiling, grilling, and broiling are some healthy options. Eat more home-cooked food and less restaurant, buffet, and fast food. Avoid cooking using saturated fats. Animal sources of saturated fats include meats, butter, and cream. Plant sources of saturated fats include palm oil, palm kernel oil, and coconut oil. Meal planning  At meals, imagine dividing your plate into fourths: Fill one-half of your plate with vegetables, green salads, and fruit. Fill one-fourth of your plate with whole grains. Fill one-fourth of your plate with lean protein foods. Eat fish that is high in omega-3 fats at least two times a week. Eat more foods that contain fiber, such as whole grains, beans, apples, pears, berries, broccoli, carrots, peas, and barley. These foods help promote healthy cholesterol levels in the blood. What foods should I eat? Fruits All fresh, canned (in natural juice), or frozen fruits. Vegetables Fresh or frozen vegetables (raw, steamed, roasted, or grilled). Green salads. Grains Whole grains, such as whole wheat or whole grain breads, crackers, cereals, and  pasta. Unsweetened oatmeal, bulgur, barley, quinoa, or brown rice. Corn or whole wheat flour tortillas. Meats and other proteins Ground beef (85% or leaner), grass-fed beef, or beef trimmed of fat. Skinless chicken or [place]. Ground chicken or [place]. Pork trimmed of fat. All fish and seafood. Egg whites. Dried beans, peas, or lentils. Unsalted nuts or seeds. Unsalted canned beans. Natural nut butters without added sugar and oil. Dairy Low-fat or nonfat dairy products, such as skim or 1% milk, 2% or reduced-fat cheeses, low-fat and fat-free ricotta or cottage cheese, or plain low-fat and nonfat yogurt. Fats and oils Tub margarine without trans fats. Light or reduced-fat mayonnaise and salad dressings. Avocado. Olive, canola, sesame, or safflower oils. The items listed above may not be a complete list of foods and beverages you can eat. Contact a dietitian for more information. What foods should I avoid? Fruits Canned fruit in heavy syrup. Fruit in cream or butter sauce. Fried fruit. Vegetables Vegetables cooked in cheese, cream, or butter sauce.  Fried vegetables. Grains White bread. White pasta. White rice. Cornbread. Bagels, pastries, and croissants. Crackers and snack foods that contain trans fat and hydrogenated oils. Meats and other proteins Fatty cuts of meat. Ribs, chicken wings, bacon, sausage, bologna, salami, chitterlings, fatback, hot dogs, bratwurst, and packaged lunch meats. Liver and organ meats. Whole eggs and egg yolks. Chicken and [place] with skin. Fried meat. Dairy Whole or 2% milk, cream, half-and-half, and cream cheese. Whole milk cheeses. Whole-fat or sweetened yogurt. Full-fat cheeses. Nondairy creamers and whipped toppings. Processed cheese, cheese spreads, and cheese curds. Fats and oils Butter, stick margarine, lard, shortening, ghee, or bacon fat. Coconut, palm kernel, and palm oils. Beverages Alcohol. Sugar-sweetened drinks such as sodas, lemonade, and fruit  drinks. Sweets and desserts Corn syrup, sugars, honey, and molasses. Candy. Jam and jelly. Syrup. Sweetened cereals. Cookies, pies, cakes, donuts, muffins, and ice cream. The items listed above may not be a complete list of foods and beverages you should avoid. Contact a dietitian for more information. Summary Your body needs fat and cholesterol for basic functions. However, eating too much of these things can be harmful to your health. Work with your health care provider and dietitian to follow a diet that limits fat and cholesterol. Doing this may help lower your risk for heart disease and other conditions. Choose healthy fats, such as monounsaturated and polyunsaturated fats, and foods high in omega-3 fatty acids. Eat fiber-rich foods, such as whole grains, beans, peas, fruits, and vegetables. Limit or avoid alcohol, fried foods, and foods high in saturated fats, partially hydrogenated oils, and sugar. This information is not intended to replace advice given to you by your health care provider. Make sure you discuss any questions you have with your health care provider. Document Revised: 11/27/2020 Document Reviewed: 11/27/2020 Elsevier Patient Education  [place].

## 2022-07-21 ENCOUNTER — Other Ambulatory Visit: Payer: Self-pay

## 2022-07-27 ENCOUNTER — Encounter: Payer: BC Managed Care – PPO | Admitting: Internal Medicine

## 2022-07-27 ENCOUNTER — Other Ambulatory Visit: Payer: Self-pay | Admitting: Internal Medicine

## 2022-07-27 ENCOUNTER — Other Ambulatory Visit: Payer: Self-pay

## 2022-07-27 DIAGNOSIS — R5383 Other fatigue: Secondary | ICD-10-CM

## 2022-07-27 DIAGNOSIS — Z1231 Encounter for screening mammogram for malignant neoplasm of breast: Secondary | ICD-10-CM

## 2022-08-03 ENCOUNTER — Other Ambulatory Visit: Payer: BC Managed Care – PPO

## 2022-08-03 DIAGNOSIS — R5383 Other fatigue: Secondary | ICD-10-CM

## 2022-08-04 LAB — IRON,TIBC AND FERRITIN PANEL
%SAT: 17 % (calc) (ref 16–45)
Ferritin: 74 ng/mL (ref 16–288)
Iron: 79 ug/dL (ref 45–160)
TIBC: 452 mcg/dL (calc) — ABNORMAL HIGH (ref 250–450)

## 2022-08-05 ENCOUNTER — Other Ambulatory Visit: Payer: Self-pay | Admitting: Family

## 2022-08-09 DIAGNOSIS — D320 Benign neoplasm of cerebral meninges: Secondary | ICD-10-CM | POA: Diagnosis not present

## 2022-08-09 DIAGNOSIS — D329 Benign neoplasm of meninges, unspecified: Secondary | ICD-10-CM | POA: Diagnosis not present

## 2022-08-16 ENCOUNTER — Encounter: Payer: Self-pay | Admitting: Internal Medicine

## 2022-08-19 ENCOUNTER — Other Ambulatory Visit: Payer: Self-pay | Admitting: Family

## 2022-08-19 MED ORDER — ATOVAQUONE-PROGUANIL HCL 250-100 MG PO TABS: 1.0000 | ORAL_TABLET | Freq: Every day | ORAL | 0 refills | Status: DC

## 2022-08-23 ENCOUNTER — Encounter: Payer: Self-pay | Admitting: Family Medicine

## 2022-08-23 ENCOUNTER — Ambulatory Visit: Payer: BC Managed Care – PPO | Admitting: Family Medicine

## 2022-08-23 VITALS — BP 128/76 | HR 78 | Temp 97.9°F | Wt 165.0 lb

## 2022-08-23 DIAGNOSIS — M5412 Radiculopathy, cervical region: Secondary | ICD-10-CM

## 2022-08-23 MED ORDER — CYCLOBENZAPRINE HCL 10 MG PO TABS: 10.0000 mg | ORAL_TABLET | Freq: Three times a day (TID) | ORAL | 0 refills | Status: DC | PRN

## 2022-08-23 MED ORDER — METHYLPREDNISOLONE 4 MG PO TBPK: ORAL_TABLET | ORAL | 0 refills | Status: DC

## 2022-08-23 NOTE — Progress Notes (Signed)
   Subjective:    Patient ID: [name], female    DOB: [birth year], 62 y.o.   MRN: [id number]  HPI Here for 4 weeks of intermittent sharp pains in the right posterior neck that also shoot down the right arm into the right thumb. Sometimes she feels tingling in these areas. No recent trauma. Heat and Ibuprofen have helped a little.    Review of Systems  Constitutional: Negative.   Respiratory: Negative.    Cardiovascular: Negative.   Musculoskeletal:  Positive for neck pain.       Objective:   Physical Exam Constitutional:      General: She is not in acute distress.    Appearance: Normal appearance.  Cardiovascular:     Rate and Rhythm: Normal rate and regular rhythm.     Pulses: Normal pulses.     Heart sounds: Normal heart sounds.  Pulmonary:     Effort: Pulmonary effort is normal.     Breath sounds: Normal breath sounds.  Musculoskeletal:     Comments: She is tender on the right posterior neck around the C5-6 area, especially on the right side. She is also tender on the superior right trapezius. The neck has full ROM. The right shoulder is normal on exam   Neurological:     Mental Status: She is alert.           Assessment & Plan:  She has cervical radiculopathy. We will treat this with Flexeril and a Medrol dose pack. Recheck as needed.  Alysia Penna, MD

## 2022-08-30 ENCOUNTER — Encounter: Payer: Self-pay | Admitting: Family Medicine

## 2022-08-30 DIAGNOSIS — M5412 Radiculopathy, cervical region: Secondary | ICD-10-CM

## 2022-08-30 NOTE — Telephone Encounter (Signed)
We need to get Xrays of her cervical spine. I put in the orders for her to go to [place] this afternoon if possible

## 2022-08-31 ENCOUNTER — Ambulatory Visit (INDEPENDENT_AMBULATORY_CARE_PROVIDER_SITE_OTHER)
Admission: RE | Admit: 2022-08-31 | Discharge: 2022-08-31 | Disposition: A | Discharge status: Home / Self Care | Payer: BC Managed Care – PPO | Source: Ambulatory Visit | Attending: Family Medicine | Admitting: Family Medicine

## 2022-08-31 ENCOUNTER — Other Ambulatory Visit: Payer: Self-pay | Admitting: Internal Medicine

## 2022-08-31 ENCOUNTER — Other Ambulatory Visit: Payer: Self-pay | Admitting: Interventional Cardiology

## 2022-08-31 DIAGNOSIS — M5412 Radiculopathy, cervical region: Secondary | ICD-10-CM | POA: Diagnosis not present

## 2022-08-31 DIAGNOSIS — R200 Anesthesia of skin: Secondary | ICD-10-CM | POA: Diagnosis not present

## 2022-09-01 MED ORDER — METHYLPREDNISOLONE 4 MG PO TBPK: ORAL_TABLET | ORAL | 0 refills | Status: DC

## 2022-09-01 NOTE — Addendum Note (Signed)
Addended by: Alysia Penna A on: 09/01/2022 12:40 PM   Modules accepted: Orders

## 2022-09-05 MED ORDER — CYCLOBENZAPRINE HCL 10 MG PO TABS: 10.0000 mg | ORAL_TABLET | Freq: Three times a day (TID) | ORAL | 0 refills | Status: DC | PRN

## 2022-09-05 NOTE — Telephone Encounter (Signed)
I sent in the refill for Cyclobenzaprine. I did order the MRI on 09-01-22. Have her contact [facility] to speed things up as far as scheduling the scan

## 2022-09-05 NOTE — Addendum Note (Signed)
Addended by: Alysia Penna A on: 09/05/2022 05:11 PM   Modules accepted: Orders

## 2022-09-06 DIAGNOSIS — H2513 Age-related nuclear cataract, bilateral: Secondary | ICD-10-CM | POA: Diagnosis not present

## 2022-09-06 DIAGNOSIS — H25013 Cortical age-related cataract, bilateral: Secondary | ICD-10-CM | POA: Diagnosis not present

## 2022-09-09 ENCOUNTER — Telehealth: Payer: Self-pay | Admitting: Internal Medicine

## 2022-09-09 ENCOUNTER — Encounter: Payer: Self-pay | Admitting: Family Medicine

## 2022-09-09 NOTE — Telephone Encounter (Signed)
Patient calling back, still needs auth for MRI. Patient states she is leaving the country later this week.

## 2022-09-09 NOTE — Telephone Encounter (Signed)
Pt is sch for MRI on 09-11-2022 and [name] is calling and need MRI authorization by 2 pm today  if not pt   MR CERVICAL SPINE [place] will be cancel

## 2022-09-11 ENCOUNTER — Other Ambulatory Visit: Payer: BC Managed Care – PPO

## 2022-09-12 NOTE — Telephone Encounter (Signed)
Done

## 2022-09-15 ENCOUNTER — Ambulatory Visit
Admission: RE | Admit: 2022-09-15 | Discharge: 2022-09-15 | Disposition: A | Discharge status: Home / Self Care | Payer: BC Managed Care – PPO | Source: Ambulatory Visit | Attending: Family Medicine | Admitting: Family Medicine

## 2022-09-15 DIAGNOSIS — M5412 Radiculopathy, cervical region: Secondary | ICD-10-CM | POA: Diagnosis not present

## 2022-09-15 DIAGNOSIS — M4802 Spinal stenosis, cervical region: Secondary | ICD-10-CM | POA: Diagnosis not present

## 2022-09-19 NOTE — Addendum Note (Signed)
Addended by: Alysia Penna A on: 09/19/2022 07:57 AM   Modules accepted: Orders

## 2022-09-20 NOTE — Telephone Encounter (Signed)
Noted  

## 2022-09-22 ENCOUNTER — Ambulatory Visit: Payer: BC Managed Care – PPO

## 2022-10-03 ENCOUNTER — Ambulatory Visit
Admission: RE | Admit: 2022-10-03 | Discharge: 2022-10-03 | Disposition: A | Discharge status: Home / Self Care | Payer: BC Managed Care – PPO | Source: Ambulatory Visit | Attending: Internal Medicine | Admitting: Internal Medicine

## 2022-10-03 ENCOUNTER — Ambulatory Visit: Payer: BC Managed Care – PPO | Admitting: Family Medicine

## 2022-10-03 ENCOUNTER — Encounter: Payer: Self-pay | Admitting: Family Medicine

## 2022-10-03 VITALS — BP 110/80 | HR 88 | Temp 98.2°F | Wt 160.8 lb

## 2022-10-03 DIAGNOSIS — M5412 Radiculopathy, cervical region: Secondary | ICD-10-CM

## 2022-10-03 DIAGNOSIS — Z1231 Encounter for screening mammogram for malignant neoplasm of breast: Secondary | ICD-10-CM | POA: Diagnosis not present

## 2022-10-03 NOTE — Progress Notes (Signed)
   Subjective:    Patient ID: [name], female    DOB: Dec 18, 1960, 62 y.o.   MRN: [id number]  HPI Here to follow up on sharp pains running from her neck down the right arm. We saw her on 08-23-22 for this and she was given a muscle relaxer and a Medrol dose pack. These did not help, so she had a cervical MRI on 09-15-22. This revealed severe spinal stenosis with biforaminal narrowing at C5-6. We then put in a referral to [facility] about this. She is here today to go over the MRI results in more detail. Her symptoms have not changed.    Review of Systems  Constitutional: Negative.   Respiratory: Negative.    Cardiovascular: Negative.   Musculoskeletal:  Positive for neck pain.       Objective:   Physical Exam Constitutional:      Appearance: Normal appearance.  Cardiovascular:     Rate and Rhythm: Normal rate and regular rhythm.     Pulses: Normal pulses.     Heart sounds: Normal heart sounds.  Pulmonary:     Effort: Pulmonary effort is normal.     Breath sounds: Normal breath sounds.  Musculoskeletal:     Cervical back: Neck supple. No tenderness.  Neurological:     Mental Status: She is alert.           Assessment & Plan:  Cervical spinal stenosis with radiculopathy. She will see Neurosurgery as above to evaluate this.  Alysia Penna, MD

## 2022-10-17 ENCOUNTER — Encounter: Payer: Self-pay | Admitting: Internal Medicine

## 2022-10-20 ENCOUNTER — Other Ambulatory Visit: Payer: BC Managed Care – PPO

## 2022-10-20 ENCOUNTER — Other Ambulatory Visit (INDEPENDENT_AMBULATORY_CARE_PROVIDER_SITE_OTHER): Payer: BC Managed Care – PPO

## 2022-10-20 DIAGNOSIS — R5383 Other fatigue: Secondary | ICD-10-CM | POA: Diagnosis not present

## 2022-10-20 DIAGNOSIS — R7303 Prediabetes: Secondary | ICD-10-CM | POA: Diagnosis not present

## 2022-10-20 LAB — BASIC METABOLIC PANEL
BUN: 13 mg/dL (ref 6–23)
CO2: 31 mEq/L (ref 19–32)
Calcium: 9.4 mg/dL (ref 8.4–10.5)
Chloride: 100 mEq/L (ref 96–112)
Creatinine, Ser: 0.64 mg/dL (ref 0.40–1.20)
GFR: 93.68 mL/min (ref >60.00)
Glucose, Bld: 95 mg/dL (ref 70–99)
Potassium: 4.2 mEq/L (ref 3.5–5.1)
Sodium: 138 mEq/L (ref 135–145)

## 2022-10-20 LAB — HEMOGLOBIN A1C: Hgb A1c MFr Bld: 6.9 % — ABNORMAL HIGH (ref 4.6–6.5)

## 2022-10-21 LAB — IRON,TIBC AND FERRITIN PANEL
%SAT: 16 % (calc) (ref 16–45)
Ferritin: 96 ng/mL (ref 16–288)
Iron: 74 ug/dL (ref 45–160)
TIBC: 451 mcg/dL (calc) — ABNORMAL HIGH (ref 250–450)
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
## 2022-10-26 ENCOUNTER — Encounter: Payer: Self-pay | Admitting: Interventional Cardiology

## 2022-10-26 ENCOUNTER — Ambulatory Visit: Payer: BC Managed Care – PPO | Attending: Interventional Cardiology | Admitting: Interventional Cardiology

## 2022-10-26 VITALS — BP 130/84 | HR 83 | Ht 59.0 in | Wt 162.0 lb

## 2022-10-26 DIAGNOSIS — I2584 Coronary atherosclerosis due to calcified coronary lesion: Secondary | ICD-10-CM

## 2022-10-26 DIAGNOSIS — Z6832 Body mass index (BMI) 32.0-32.9, adult: Secondary | ICD-10-CM | POA: Diagnosis not present

## 2022-10-26 DIAGNOSIS — I700 Atherosclerosis of aorta: Secondary | ICD-10-CM | POA: Diagnosis not present

## 2022-10-26 DIAGNOSIS — M5412 Radiculopathy, cervical region: Secondary | ICD-10-CM | POA: Diagnosis not present

## 2022-10-26 DIAGNOSIS — I2510 Atherosclerotic heart disease of native coronary artery without angina pectoris: Secondary | ICD-10-CM | POA: Diagnosis not present

## 2022-10-26 DIAGNOSIS — R000 Tachycardia, unspecified: Secondary | ICD-10-CM | POA: Diagnosis not present

## 2022-10-26 NOTE — Patient Instructions (Signed)
Medication Instructions:  Your physician recommends that you continue on your current medications as directed. Please refer to the Current Medication list given to you today.  *If you need a refill on your cardiac medications before your next appointment, please call your pharmacy*   Lab Work: none If you have labs (blood work) drawn today and your tests are completely normal, you will receive your results only by: MyChart Message (if you have MyChart) OR A paper copy in the mail If you have any lab test that is abnormal or we need to change your treatment, we will call you to review the results.   Testing/Procedures: none   Follow-Up: At [facility], you and your health needs are our priority.  As part of our continuing mission to provide you with exceptional heart care, we have created designated Provider Care Teams.  These Care Teams include your primary Cardiologist (physician) and Advanced Practice Providers (APPs -  Physician Assistants and Nurse Practitioners) who all work together to provide you with the care you need, when you need it.  We recommend signing up for the patient portal called "MyChart".  Sign up information is provided on this After Visit Summary.  MyChart is used to connect with patients for Virtual Visits (Telemedicine).  Patients are able to view lab/test results, encounter notes, upcoming appointments, etc.  Non-urgent messages can be sent to your provider as well.   To learn more about what you can do with MyChart, go to [email or web address].    Your next appointment:   12 month(s)  Provider:   Jayadeep Varanasi, MD     Other Instructions    

## 2022-11-07 ENCOUNTER — Ambulatory Visit (INDEPENDENT_AMBULATORY_CARE_PROVIDER_SITE_OTHER): Payer: BC Managed Care – PPO | Admitting: Obstetrics & Gynecology

## 2022-11-07 ENCOUNTER — Encounter: Payer: Self-pay | Admitting: Obstetrics & Gynecology

## 2022-11-07 ENCOUNTER — Other Ambulatory Visit (HOSPITAL_COMMUNITY)
Admission: RE | Admit: 2022-11-07 | Discharge: 2022-11-07 | Disposition: A | Discharge status: Home / Self Care | Payer: BC Managed Care – PPO | Source: Ambulatory Visit | Attending: Obstetrics & Gynecology | Admitting: Obstetrics & Gynecology

## 2022-11-07 VITALS — BP 138/86 | HR 91 | Resp 18 | Ht 58.47 in | Wt 163.8 lb

## 2022-11-07 DIAGNOSIS — D329 Benign neoplasm of meninges, unspecified: Secondary | ICD-10-CM | POA: Diagnosis not present

## 2022-11-07 DIAGNOSIS — Z780 Asymptomatic menopausal state: Secondary | ICD-10-CM | POA: Diagnosis not present

## 2022-11-07 DIAGNOSIS — Z01419 Encounter for gynecological examination (general) (routine) without abnormal findings: Secondary | ICD-10-CM | POA: Diagnosis not present

## 2022-11-07 DIAGNOSIS — M85851 Other specified disorders of bone density and structure, right thigh: Secondary | ICD-10-CM

## 2022-11-07 NOTE — Progress Notes (Signed)
[name] 10-22-1960 [id number]   History:    62 y.o. G2P2L2   RP:  Established patient presenting for annual gyn exam    HPI: Postmenopausal, well on no HRT. No significant symptoms at this time.  No pelvic pain.  Abstinent. Pap smear/HPV 04/2018.  No significant history of abnormal Pap smears.  Pap reflex today. Breasts normal.  Mammogram Neg in 09/2022.  Colono 06/2015, q10 yrs.  Osteopenia on last BD 11/02/2021. She is supplementing with vitamin D and Ca++.  BMI 33.69.  Health Labs with Fam MD.    Past medical history,surgical history, family history and social history were all reviewed and documented in the EPIC chart.  Gynecologic History No LMP recorded. Patient is postmenopausal.  Obstetric History OB History  Gravida Para Term Preterm AB Living  2 2 2     2   SAB IAB Ectopic Multiple Live Births          2    # Outcome Date GA Lbr Len/2nd Weight Sex Delivery Anes PTL Lv  2 Term     M CS-Unspec  N LIV  1 Term     F CS-Unspec  N LIV     ROS: A ROS was performed and pertinent positives and negatives are included in the history. GENERAL: No fevers or chills. HEENT: No change in vision, no earache, sore throat or sinus congestion. NECK: No pain or stiffness. CARDIOVASCULAR: No chest pain or pressure. No palpitations. PULMONARY: No shortness of breath, cough or wheeze. GASTROINTESTINAL: No abdominal pain, nausea, vomiting or diarrhea, melena or bright red blood per rectum. GENITOURINARY: No urinary frequency, urgency, hesitancy or dysuria. MUSCULOSKELETAL: No joint or muscle pain, no back pain, no recent trauma. DERMATOLOGIC: No rash, no itching, no lesions. ENDOCRINE: No polyuria, polydipsia, no heat or cold intolerance. No recent change in weight. HEMATOLOGICAL: No anemia or easy bruising or bleeding. NEUROLOGIC: No headache, seizures, numbness, tingling or weakness. PSYCHIATRIC: No depression, no loss of interest in normal activity or change in sleep pattern.      Exam:  BP 138/86 (BP Location: Left Arm, Patient Position: Sitting)   Pulse 91   Resp 18   Ht 4' 10.47" (1.485 m) Comment: with shoes, declined to remove  Wt 163 lb 12.8 oz (74.3 kg)   SpO2 95%   BMI 33.69 kg/m  General appearance : Well developed well nourished female. No acute distress HEENT: Eyes: no retinal hemorrhage or exudates,  Neck supple, trachea midline, no carotid bruits, no thyroidmegaly Lungs: Clear to auscultation, no rhonchi or wheezes, or rib retractions  Heart: Regular rate and rhythm, no murmurs or gallops Breast:Examined in sitting and supine position were symmetrical in appearance, no palpable masses or tenderness,  no skin retraction, no nipple inversion, no nipple discharge, no skin discoloration, no axillary or supraclavicular lymphadenopathy Abdomen: no palpable masses or tenderness, no rebound or guarding Extremities: no edema or skin discoloration or tenderness  Pelvic: Vulva: Normal             Vagina: No gross lesions or discharge  Cervix: No gross lesions or discharge.  Pap reflex done.  Uterus  AV, normal size, shape and consistency, non-tender and mobile  Adnexa  Without masses or tenderness  Anus: Normal   Assessment/Plan:  62 y.o. female for annual exam   1. Encounter for routine gynecological examination with Papanicolaou smear of cervix Postmenopausal, well on no HRT. No significant symptoms at this time.  No pelvic pain.  Abstinent. Pap smear/HPV  04/2018.  No significant history of abnormal Pap smears.  Pap reflex today. Breasts normal.  Mammogram Neg in 09/2022.  Colono 06/2015, q10 yrs.  Osteopenia on last BD 11/02/2021. She is supplementing with vitamin D and Ca++.  BMI 33.69.  Health Labs with Fam MD. - Cytology - PAP( [facility])  2. Postmenopause Postmenopausal, well on no HRT. No significant symptoms at this time.  No pelvic pain.  Abstinent.   3. Osteopenia of neck of right femur Osteopenia on last BD 11/02/2021. She is supplementing  with vitamin D and Ca++.   4. Benign meningioma Removed in 11/2021.  Followed by Neuro at [facility].  Other orders - albuterol (VENTOLIN HFA) 108 (90 Base) MCG/ACT inhaler; Inhale into the lungs.   Genia Del MD, 4:03 PM

## 2022-11-09 LAB — CYTOLOGY - PAP: Diagnosis: NEGATIVE

## 2022-11-17 ENCOUNTER — Ambulatory Visit: Payer: BC Managed Care – PPO | Admitting: Family Medicine

## 2022-11-17 ENCOUNTER — Encounter: Payer: Self-pay | Admitting: Family Medicine

## 2022-11-17 VITALS — BP 128/80 | HR 98 | Temp 98.6°F | Wt 161.0 lb

## 2022-11-17 DIAGNOSIS — J029 Acute pharyngitis, unspecified: Secondary | ICD-10-CM

## 2022-11-17 DIAGNOSIS — R0989 Other specified symptoms and signs involving the circulatory and respiratory systems: Secondary | ICD-10-CM | POA: Diagnosis not present

## 2022-11-17 DIAGNOSIS — J45901 Unspecified asthma with (acute) exacerbation: Secondary | ICD-10-CM | POA: Diagnosis not present

## 2022-11-17 DIAGNOSIS — J40 Bronchitis, not specified as acute or chronic: Secondary | ICD-10-CM

## 2022-11-17 LAB — POC COVID19 BINAXNOW: SARS Coronavirus 2 Ag: NEGATIVE

## 2022-11-17 LAB — POCT RAPID STREP A (OFFICE): Rapid Strep A Screen: NEGATIVE

## 2022-11-17 MED ORDER — AZITHROMYCIN 250 MG PO TABS: ORAL_TABLET | ORAL | 0 refills | Status: DC

## 2022-11-17 MED ORDER — ALBUTEROL SULFATE HFA 108 (90 BASE) MCG/ACT IN AERS: 2.0000 | INHALATION_SPRAY | RESPIRATORY_TRACT | 0 refills | Status: DC | PRN

## 2022-11-17 MED ORDER — IPRATROPIUM-ALBUTEROL 0.5-2.5 (3) MG/3ML IN SOLN
3.0000 mL | Freq: Once | RESPIRATORY_TRACT | Status: AC
Start: 2022-11-17 — End: 2022-11-17
  Administered 2022-11-17: 3 mL via RESPIRATORY_TRACT

## 2022-11-17 MED ORDER — HYDROCODONE BIT-HOMATROP MBR 5-1.5 MG/5ML PO SOLN: 5.0000 mL | ORAL | 0 refills | Status: DC | PRN

## 2022-11-17 MED ORDER — BUDESONIDE-FORMOTEROL FUMARATE 80-4.5 MCG/ACT IN AERO: 2.0000 | INHALATION_SPRAY | Freq: Two times a day (BID) | RESPIRATORY_TRACT | 0 refills | Status: DC

## 2022-11-17 MED ORDER — METHYLPREDNISOLONE 4 MG PO TBPK: ORAL_TABLET | ORAL | 0 refills | Status: DC

## 2022-11-17 NOTE — Progress Notes (Signed)
   Subjective:    Patient ID: [name], female    DOB: [birth year], 62 y.o.   MRN: [id number]  HPI Here for 3 days of head congestion, PND, ST, chest tightness, wheezing, and coughing up yellow sputum. No fever. Using her inhalers.    Review of Systems  Constitutional: Negative.   HENT:  Positive for congestion, postnasal drip and sore throat. Negative for ear pain and sinus pain.   Eyes: Negative.   Respiratory:  Positive for cough, chest tightness, shortness of breath and wheezing.   Cardiovascular: Negative.        Objective:   Physical Exam Constitutional:      General: She is not in acute distress.    Appearance: Normal appearance.  HENT:     Right Ear: Tympanic membrane, ear canal and external ear normal.     Left Ear: Tympanic membrane, ear canal and external ear normal.     Nose: Nose normal.     Mouth/Throat:     Pharynx: Oropharynx is clear.  Eyes:     Conjunctiva/sclera: Conjunctivae normal.  Pulmonary:     Effort: Pulmonary effort is normal.     Breath sounds: Wheezing and rhonchi present. No rales.  Lymphadenopathy:     Cervical: No cervical adenopathy.  Neurological:     Mental Status: She is alert.           Assessment & Plan:  She has a bronchitis and this has caused an acute exacerbation of her asthma. She is given a nebulizer treatment with Duoneb today, and this helped her a bit. She felt less SOB and her lungs had better airflow after that. We will treat this with a Zpack and a Medrol dose pack. She can use her albuterol inhaler as needed. Recheck as needed. We spent a total of ( 32  ) minutes reviewing records and discussing these issues.  Gershon Crane, MD

## 2022-11-18 ENCOUNTER — Ambulatory Visit: Payer: BC Managed Care – PPO | Admitting: Internal Medicine

## 2022-11-18 ENCOUNTER — Ambulatory Visit
Admission: RE | Admit: 2022-11-18 | Discharge: 2022-11-18 | Disposition: A | Discharge status: Home / Self Care | Payer: BC Managed Care – PPO | Source: Ambulatory Visit | Attending: Internal Medicine | Admitting: Internal Medicine

## 2022-11-18 ENCOUNTER — Encounter: Payer: Self-pay | Admitting: Internal Medicine

## 2022-11-18 VITALS — BP 124/80 | HR 88 | Temp 98.1°F | Ht 58.47 in | Wt 161.8 lb

## 2022-11-18 DIAGNOSIS — J45991 Cough variant asthma: Secondary | ICD-10-CM

## 2022-11-18 DIAGNOSIS — R062 Wheezing: Secondary | ICD-10-CM | POA: Diagnosis not present

## 2022-11-18 DIAGNOSIS — J40 Bronchitis, not specified as acute or chronic: Secondary | ICD-10-CM

## 2022-11-18 DIAGNOSIS — R0602 Shortness of breath: Secondary | ICD-10-CM | POA: Diagnosis not present

## 2022-11-18 DIAGNOSIS — R059 Cough, unspecified: Secondary | ICD-10-CM | POA: Diagnosis not present

## 2022-11-18 MED ORDER — BUDESONIDE-FORMOTEROL FUMARATE 160-4.5 MCG/ACT IN AERO: INHALATION_SPRAY | RESPIRATORY_TRACT | 12 refills | Status: DC

## 2022-11-18 MED ORDER — METHYLPREDNISOLONE ACETATE 80 MG/ML IJ SUSP
120.0000 mg | Freq: Once | INTRAMUSCULAR | Status: AC
Start: 2022-11-18 — End: 2022-11-18
  Administered 2022-11-18: 120 mg via INTRAMUSCULAR

## 2022-11-18 NOTE — Assessment & Plan Note (Signed)
Onset around 2014 FENO 12/19/2017  = 12 on singulair  symb 160 2bid but none on day of ov and hfa baseline quite poor  - 12/19/2017  After extensive coaching inhaler device  effectiveness =    50% from a baseline of 0> try symb 80 2bid x 2 week sample   - Allergy profile 12/19/2017 >  Eos 0.1 /  IgE  44 RAST neg - 06/11/2021    continue symbicort 80 2bid  - 08/06/2021  After extensive coaching inhaler device,  effectiveness =    80%  -  08/06/2021  Gabapentin 100 mg three times daily >>> could not tolerate  - 10/04/2021 rec try off symbicort then h1 blocker  - 11/18/2022  After extensive coaching inhaler device,  effectiveness =    75% from a baseline of 50%   Acute flare either uri or allergic hard to tell but needs to remember in event of cough for any reason:  Of the three most common causes of  Sub-acute / recurrent or chronic cough, only one (GERD)  can actually contribute to/ trigger  the other two (asthma and post nasal drip syndrome)  and perpetuate the cylce of cough.  While not intuitively obvious, many patients with chronic low grade reflux do not cough until there is a primary insult that disturbs the protective epithelial barrier and exposes sensitive nerve endings.   This is typically viral but can due to PNDS and  either may apply here.    >>>  The point is that once this occurs, it is difficult to eliminate the cycle  using anything but a maximally effective acid suppression regimen at least in the short run, accompanied by an appropriate diet to address non acid GERD and control / eliminate the cough itself with mucinex D since she also has nasal congesiton/ flutter valve and complete pred rx plus depomedrol 120 mg IM today   F/u one week if not better, to ER if worse.          Each maintenance medication was reviewed in detail including emphasizing most importantly the difference between maintenance and prns and under what circumstances the prns are to be triggered using an action plan  format where appropriate.  Total time for H and P, chart review, counseling, reviewing hfa/flutter device(s) and generating customized AVS unique to this office visit / same day charting = 41 min acute eval

## 2022-11-18 NOTE — Patient Instructions (Addendum)
For nasal and chest congestion >  Mucinex D as needed as per bottle  >>>   use the flutter valve as much as possible to help bring up mucus   Finish your zpak and call if mucus is not turning clear by 1st of week  For cough for any reason omeprazole   Take 30-60 min before first and last meal of the day until cough is gone   For breathing take albuterol 2 puffs every 4 hours as needed  Practice with empty symbicort device : Work on inhaler technique:  relax and gently blow all the way out then take a nice smooth full deep breath back in, triggering the inhaler at same time you start breathing in.  Hold breath in for at least  5 seconds if you can. Blow out symbicort thru nose. Rinse and gargle with water when done.  If mouth or throat bother you at all,  try brushing teeth/gums/tongue with arm and hammer toothpaste/ make a slurry and gargle and spit out.   Depomedrol 120 mg IM    Please remember to go to the  x-ray department  for your tests - we will call you with the results when they are available

## 2022-11-18 NOTE — Progress Notes (Unsigned)
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 Regis Bill. Pt c/o SOB for the past 2 months. She gets winded if she talks to much. She also gets SOB with walking but unable to say how far she walks before she notices a problem.   cough came on abruptly and of March 2018 with rhinitis/nasal discharge/ some sneezing with subjective wheezing   rx pred/delsym/ proair (not very helpful) changed to symb/singulair and gradually improved but still harsh coughing fits sporadic pattern        Kouffman Reflux v Neurogenic Cough Differentiator Reflux Comments  Do you awaken from a sound sleep coughing violently?                            With trouble breathing? Yes   Do you have choking episodes when you cannot  Get enough air, gasping for air ?              NO    Do you usually cough when you lie down into  The bed, or when you just lie down to rest ?                          Not really    Do you usually cough after meals or eating?         Worse    Do you cough when (or after) you bend over?    No    GERD SCORE     Kouffman Reflux v Neurogenic Cough Differentiator Neurogenic   Do you more-or-less cough all day long? Sporadic    Does change of temperature make you cough? no   Does laughing or chuckling cause you to cough? no   Do fumes (perfume, automobile fumes, burned  Toast, etc.,)  cause you to cough ?      no   Does speaking, singing, or talking on the phone cause you to cough   ?               No    Neurogenic/Airway score         Unless coughing not sob  Rec symbicort change to 80 strength and take up to 2 every 12 hours if it's helping  Work on inhaler technique:   First take delsym two tsp every 12 hours and supplement if needed with  tramadol 50 mg up to 1 every 4 hours to suppress the urge to cough at all or even clear your throat.  Prednisone 10 mg take  4 each am x 2 days,   2 each am x 2 days,  1 each am x 2 days and stop (this is to eliminate allergies and inflammation from  coughing) Protonix (pantoprazole) Take 30-60 min before first meal of the day and Pepcid 20 mg one bedtime plus chlorpheniramine 4 mg x 2 at bedtime (both available over the counter)  until cough is completely gone for at least a week without the need for cough suppression GERD diet reviewed, bed blocks       05/28/2021 Re-establish /Pryor Guettler re: daily throat clearing 2014 never completely resolved then recurrent severe cough flare p covid Mid Sep 2022 maint on prilosec 20 mg one before supper   Chief Complaint  Patient presents with   Pulmonary Consult    04/19/21 dx with covid- sore throat and cough- tx with antiviral. Cough has lingered since then and now wheezing- pred helps but symptoms return one she completes med. She has been using her albuterol inhaler every 4 hours.   Dyspnea:  nl activities no trouble / not aerobically active  Cough: worse when lies down but present daytime also  / min clear mucus  Sleeping: bed is flat/ on side / does disturb sleep with subj wheezing supine   SABA use: not helping much  02: none  Covid status:  vax x 3  Rec Prednisone 10 mg take  4 each am x 2 days,   2 each am x 2 days,  1 each am x 2 days and stop    The key to effective treatment for your cough is eliminating the non-stop cycle of cough you're stuck in First take delsym two tsp every 12  hours and supplement if needed with  tramadol 50 mg up to 1 every 4 hours    Protonix (pantoprazole) Take 30-60 min before first meal of the day and Pepcid 20 mg one bedtime plus chlorpheniramine 4 mg x 2 at bedtime (both available over the counter)  until cough is completely gone for at least a week without the need for cough suppression GERD rx If the cough starts back up as you taper the prednisone or need more albuterol then start the symbicort 80 Take 2 puffs first thing in am and then another 2 puffs about 12 hours later.  Work on inhaler technique:   06/11/2021  f/u ov/Erabella Kuipers re: uacs vs cough variant asthma  maint on symb 80 2bid (50% technique) not taking gerd rx correctly and has only used 9 tramadol total in last 14 days Chief Complaint  Patient presents with   Follow-up    Pt states cough with mucous   Dyspnea:  Not limited by breathing from desired activities   Cough:  more day than noct as takes tramadol at hs and really min mucoid sputum production  Sleeping: does fine once asleep,flat SABA use: not using but thinks it may have helped  02: none  Wheezing bothers her towards bedtime and persisted even on prednisone  Rec Plan A = Automatic = Always=    Symbicort 80 Take 2 puffs first thing in am and then another 2 puffs about 12 hours later.  Work on inhaler technique:   Plan B = Backup (to supplement plan A, not to replace it) for breathing/ wheezing  Only use your albuterol inhaler as a rescue medication For drainage / throat tickle try take CHLORPHENIRAMINE  4 mg   If A and B aren't working > Prednisone 10 mg take  4 each am x 2 days,   2 each am x 2 days,  1 each am x 2 days and stop  Keep your previous appointment  08/06/2021  f/u ov/Zyona Pettaway re: asthma/uacs/ flare while in [place] exp to Perfume   maint on symbort 80 2bid / 1st gen H1 blockers/ gerd rx/ never took prednisone as above Chief Complaint  Patient presents with   Follow-up    Just returned home from [place]- c/o  minimal cough and wheezing. Her cough is prod with clear sputum.    Dyspnea:  Not limited by breathing from desired activities   Cough: dry/better p saba/ never really stopped throat clearing p last ov per husband Sleeping: flat bed/ on side no resp cc SABA use:  tid ? If really helps  02: none  Covid status:vax x 4  Rec Prednisone 10 mg take  4 each am x 2 days,   2 each am x 2 days,  1 each am x 2 days and stop  Take delsym two tsp every 12 hours and supplement if needed with  tramadol 50 mg up to 2 every 4 hours to suppress the urge to cough. Once you have eliminated the cough for 3 straight days try reducing the tramadol first,  then the delsym as tolerated.   Gabapentin 100 mg three times daily     10/04/2021  f/u ov/Kalena Mander re: cough variant / uacs  maint on gerd rx/ h1 and symb 80   Chief Complaint  Patient presents with   Follow-up    Cough and wheezing have resolved. Breathing is doing well. No new co's.  Dyspnea:  cleaning neighborhood and walking daily / no aerobics  Cough: gone  Sleeping: fine flat SABA use: none  02: none  Covid status:   vax x 4  Rec Omeprazole can be resumed twice daily as you were before  Stop pantoprazole and famotidine  Stop symbicort 1st but resume full dose at rist sign resume immediately for a week  After a full week off symbicort >>> For drainage / throat tickle tAS NEEDED take CHLORPHENIRAMINE  4 mg   Pulmonary follow up is as needed      11/18/2022  Acute  ov/[place] office/Laronn Devonshire re: recurrent cough / had maint on h1 hs/ no h2 or ppi or symbicort and did fine since last ov  then acutely worse since 11/16/22   Chief Complaint  Patient presents with   Acute Visit    Cough, wheezing and SOB since Wednesday.  Dyspnea:  since onset of recurrent cough before that was fine off symbicort Cough: very congested sounding turing darker yellow/green rx with zpak by pcp 4/18  [facility]   with nasal congestion  started with st/  tickle and self rx with h1  as rec up to qid chlorpheniramine  Sleep flat bed / 2 pillows ok  SABA use: started symbicort 80 @ 80 2bid at onset  plus  albuterol tid  02: none - sats as low as 86% reported     No obvious day to day or daytime variability or assoc mucus plugs or hemoptysis or cp or chest tightness or overt sinus or hb symptoms.   Sleeping  without nocturnal  or early am exacerbation  of respiratory  c/o's or need for noct saba. Also denies any obvious fluctuation of symptoms with weather or environmental changes or other aggravating or alleviating factors except as outlined above   No unusual exposure hx or h/o childhood pna/ asthma or knowledge of premature birth.  Current Allergies, Complete Past Medical History, Past Surgical History, Family History, and Social History were reviewed in [employer] record.  ROS  The following are not active complaints unless bolded Hoarseness, sore throat, dysphagia, dental problems, itching, sneezing,  nasal congestion or discharge of excess mucus or purulent secretions, ear ache,   fever, chills, sweats, unintended wt loss or wt gain, classically pleuritic or exertional cp,  orthopnea pnd or arm/hand swelling  or leg swelling, presyncope, palpitations, abdominal pain, anorexia, nausea, vomiting, diarrhea  or change in bowel habits or change in bladder habits, change in stools or change in urine, dysuria, hematuria,  rash, arthralgias, visual complaints, headache, numbness, weakness or ataxia or problems with walking or coordination,  change in mood or  memory.        Current Meds  Medication Sig   acetaminophen (TYLENOL) 325 MG tablet Take 325 mg by mouth every 4 (four) hours as needed.   albuterol (VENTOLIN HFA) 108 (90 Base) MCG/ACT inhaler Inhale 2 puffs into the lungs every 4 (four) hours as needed for wheezing or shortness of breath.   azithromycin (ZITHROMAX Z-PAK) 250 MG tablet As directed   b complex vitamins capsule Take 1 capsule by  mouth daily.   budesonide-formoterol (SYMBICORT) 80-4.5 MCG/ACT inhaler Inhale 2 puffs into the lungs in the morning and at bedtime. SMARTSIG:By Mouth   Calcium Carbonate-Vitamin D (CALCIUM-VITAMIN D3 PO) Take 2 tablets by mouth daily.   carvedilol (COREG) 6.25 MG tablet TAKE ONE TABLET BY MOUTH TWICE DAILY   chlorpheniramine (CHLOR-TRIMETON) 4 MG tablet Take 4 mg by mouth every 4 (four) hours as needed for allergies.   Cholecalciferol (DIALYVITE VITAMIN D 5000 PO) Take 5,000 Units by mouth daily.   Coenzyme Q10 300 MG CAPS Take by mouth.   cyclobenzaprine (FLEXERIL) 10 MG tablet Take 1 tablet (10 mg total) by mouth 3 (three) times daily as needed for muscle spasms.   ferrous sulfate 325 (65 FE) MG EC tablet Take 325 mg by mouth daily with breakfast.   HYDROcodone bit-homatropine (HYCODAN) 5-1.5 MG/5ML syrup Take 5 mLs by mouth every 4 (four) hours as needed.   losartan (COZAAR) 50 MG tablet TAKE ONE-HALF TABLET BY MOUTH TWICE A DAY(TOTAL  PER DAY)   methylPREDNISolone (MEDROL DOSEPAK) 4 MG TBPK tablet As directed   Misc Natural Products (TURMERIC CURCUMIN) CAPS Take 1 tablet by mouth 2 (two) times daily.   rosuvastatin (CRESTOR) 10 MG tablet TAKE ONE TABLET BY MOUTH ONE TIME DAILY   [DISCONTINUED] budesonide-formoterol (SYMBICORT) 160-4.5 MCG/ACT inhaler Take 2 puffs first thing in am and then another 2 puffs about 12 hours later.                     Objective:   Physical Exam   wts  11/18/2022       161  10/04/2021         159  08/06/2021         161 06/11/2021     155  05/28/2021     157   12/19/17 153 lb (69.4 kg)  12/11/17 157 lb (71.2 kg)  11/23/17 158 lb 12.8 oz (72 kg)      Vital signs reviewed  11/18/2022  - Note at rest 02 sats  91% on RA   General appearance:    amb jovial [place] female nad   HEENT : Oropharynx  clear     Nasal turbinates mild non-specific edema    NECK :  without  apparent JVD/ palpable Nodes/TM    LUNGS: no acc muscle use,  Nl contour chest  with insp/ exp rhonchi  bilaterally with adequate air movement  without cough on insp or exp maneuvers   CV:  RRR  no s3 or murmur or increase in P2, and no edema   ABD:  soft and nontender with nl inspiratory excursion in the supine position. No bruits or organomegaly appreciated   MS:  Nl gait/ ext warm without deformities Or obvious joint restrictions  calf tenderness, cyanosis or clubbing    SKIN: warm and dry without lesions    NEURO:  alert, approp, nl sensorium with  no motor or cerebellar deficits apparent.      CXR PA and Lateral:   11/18/2022 :    I personally reviewed images and impression is as follows:     No air space dz    Assessment:

## 2022-11-19 ENCOUNTER — Encounter: Payer: Self-pay | Admitting: Internal Medicine

## 2022-11-22 ENCOUNTER — Encounter: Payer: Self-pay | Admitting: Internal Medicine

## 2022-11-22 ENCOUNTER — Telehealth: Payer: Self-pay | Admitting: Internal Medicine

## 2022-11-22 NOTE — Telephone Encounter (Signed)
Pt provided results through pt advice message. NFN

## 2022-11-22 NOTE — Telephone Encounter (Signed)
Pt. Calling to go over the X-ray results from Friday

## 2022-11-27 ENCOUNTER — Other Ambulatory Visit: Payer: Self-pay | Admitting: Interventional Cardiology

## 2022-11-28 ENCOUNTER — Encounter: Payer: Self-pay | Admitting: Internal Medicine

## 2022-11-28 NOTE — Telephone Encounter (Signed)
Nothing else to consider without ov with all meds in hand to regroup  - first available, not as add on

## 2022-11-28 NOTE — Telephone Encounter (Signed)
Set appt for PT asking ALL meds be brought in. Thanks.

## 2022-11-30 NOTE — Progress Notes (Signed)
Subjective:     Patient ID: [name], female   DOB: Jul 09, 1961   MRN: [id number]    History of Present Illness  62 yo [place] female Interior and spatial designer of dining for [employer] never smoker with tendency to cough toward end of trips to [place] that  last 2-3 weeks worse after travel back to [place] x 3 trips starting around 2014 referred to pulmonary clinic 12/19/2017 by Dr Fabian Sharp with new cough started end of March 2019 s antecedent travel    During the pollen season has noted cough/ wheeze in past but this is different sensation   12/19/2017 1st [facility] [place] [facility] office visit/ Kolbe Delmonaco   Chief Complaint  Patient presents with   Pulmonary Consult    Referred by Dr. Fabian Sharp. Pt c/o SOB for the past 2 months. She gets winded if she talks to much. She also gets SOB with walking but unable to say how far she walks before she notices a problem.   cough came on abruptly and of March 2018 with rhinitis/nasal discharge/ some sneezing with subjective wheezing   rx pred/delsym/ proair (not very helpful) changed to symb/singulair and gradually improved but still harsh coughing fits sporadic pattern        Kouffman Reflux v Neurogenic Cough Differentiator Reflux Comments  Do you awaken from a sound sleep coughing violently?                            With trouble breathing? Yes   Do you have choking episodes when you cannot  Get enough air, gasping for air ?              NO    Do you usually cough when you lie down into  The bed, or when you just lie down to rest ?                          Not really    Do you usually cough after meals or eating?         Worse    Do you cough when (or after) you bend over?    No    GERD SCORE     Kouffman Reflux v Neurogenic Cough Differentiator Neurogenic   Do you more-or-less cough all day long? Sporadic    Does change of temperature make you cough? no   Does laughing or chuckling cause you to cough? no   Do fumes (perfume, automobile fumes, burned  Toast, etc.,)  cause you to cough ?      no   Does speaking, singing, or talking on the phone cause you to cough   ?               No    Neurogenic/Airway score         Unless coughing not sob  Rec symbicort change to 80 strength and take up to 2 every 12 hours if it's helping  Work on inhaler technique:   First take delsym two tsp every 12 hours and supplement if needed with  tramadol 50 mg up to 1 every 4 hours to suppress the urge to cough at all or even clear your throat.  Prednisone 10 mg take  4 each am x 2 days,   2 each am x 2 days,  1 each am x 2 days and stop (this is to eliminate allergies and inflammation from coughing) Protonix (pantoprazole)  Take 30-60 min before first meal of the day and Pepcid 20 mg one bedtime plus chlorpheniramine 4 mg x 2 at bedtime (both available over the counter)  until cough is completely gone for at least a week without the need for cough suppression GERD diet reviewed, bed blocks       05/28/2021 Re-establish /[name] re: daily throat clearing 2014 never completely resolved then recurrent severe cough flare p covid Mid Sep 2022 maint on prilosec 20 mg one before supper   Chief Complaint  Patient presents with   Pulmonary Consult    04/19/21 dx with covid- sore throat and cough- tx with antiviral. Cough has lingered since then and now wheezing- pred helps but symptoms return one she completes med. She has been using her albuterol inhaler every 4 hours.   Dyspnea:  nl activities no trouble / not aerobically active  Cough: worse when lies down but present daytime also  / min clear mucus  Sleeping: bed is flat/ on side / does disturb sleep with subj wheezing supine   SABA use: not helping much  02: none  Covid status:  vax x 3  Rec Prednisone 10 mg take  4 each am x 2 days,   2 each am x 2 days,  1 each am x 2 days and stop    The key to effective treatment for your cough is eliminating the non-stop cycle of cough you're stuck in First take delsym two tsp every 12  hours and supplement if needed with  tramadol 50 mg up to 1 every 4 hours    Protonix (pantoprazole) Take 30-60 min before first meal of the day and Pepcid 20 mg one bedtime plus chlorpheniramine 4 mg x 2 at bedtime (both available over the counter)  until cough is completely gone for at least a week without the need for cough suppression GERD rx If the cough starts back up as you taper the prednisone or need more albuterol then start the symbicort 80 Take 2 puffs first thing in am and then another 2 puffs about 12 hours later.  Work on inhaler technique:   06/11/2021  f/u ov/[name] re: uacs vs cough variant asthma  maint on symb 80 2bid (50% technique) not taking gerd rx correctly and has only used 9 tramadol total in last 14 days Chief Complaint  Patient presents with   Follow-up    Pt states cough with mucous   Dyspnea:  Not limited by breathing from desired activities   Cough:  more day than noct as takes tramadol at hs and really min mucoid sputum production  Sleeping: does fine once asleep,flat SABA use: not using but thinks it may have helped  02: none  Wheezing bothers her towards bedtime and persisted even on prednisone  Rec Plan A = Automatic = Always=    Symbicort 80 Take 2 puffs first thing in am and then another 2 puffs about 12 hours later.  Work on inhaler technique:   Plan B = Backup (to supplement plan A, not to replace it) for breathing/ wheezing  Only use your albuterol inhaler as a rescue medication For drainage / throat tickle try take CHLORPHENIRAMINE  4 mg   If A and B aren't working > Prednisone 10 mg take  4 each am x 2 days,   2 each am x 2 days,  1 each am x 2 days and stop  Keep your previous appointment      08/06/2021  f/u  ov/[name] re: asthma/uacs/ flare while in [place] exp to Perfume   maint on symbort 80 2bid / 1st gen H1 blockers/ gerd rx/ never took prednisone as above Chief Complaint  Patient presents with   Follow-up    Just returned home from [place]- c/o  minimal cough and wheezing. Her cough is prod with clear sputum.    Dyspnea:  Not limited by breathing from desired activities   Cough: dry/better p saba/ never really stopped throat clearing p last ov per husband Sleeping: flat bed/ on side no resp cc SABA use:  tid ? If really helps  02: none  Covid status:vax x 4  Rec Prednisone 10 mg take  4 each am x 2 days,   2 each am x 2 days,  1 each am x 2 days and stop  Take delsym two tsp every 12 hours and supplement if needed with  tramadol 50 mg up to 2 every 4 hours to suppress the urge to cough. Once you have eliminated the cough for 3 straight days try reducing the tramadol first,  then the delsym as tolerated.   Gabapentin 100 mg three times daily     10/04/2021  f/u ov/[name] re: cough variant / uacs  maint on gerd rx/ h1 and symb 80   Chief Complaint  Patient presents with   Follow-up    Cough and wheezing have resolved. Breathing is doing well. No new co's.  Dyspnea:  cleaning neighborhood and walking daily / no aerobics  Cough: gone  Sleeping: fine flat SABA use: none  02: none  Covid status:   vax x 4  Rec Omeprazole can be resumed twice daily as you were before  Stop pantoprazole and famotidine  Stop symbicort 1st but resume full dose at rist sign resume immediately for a week  After a full week off symbicort >>> For drainage / throat tickle tAS NEEDED take CHLORPHENIRAMINE  4 mg   Pulmonary follow up is as needed     11/18/2022  Acute  ov/[name] office/[name] re: recurrent cough / had maint on h1 hs/ no h2 or ppi or symbicort and did fine since last ov  then acutely worse since 11/16/22   Chief Complaint  Patient presents with   Acute Visit    Cough, wheezing and SOB since Wednesday.  Dyspnea:  since onset of recurrent cough before that was fine off symbicort Cough: very congested sounding turing darker yellow/green rx with zpak by pcp 4/18  Assoc   with nasal congestion  started with st/  tickle and self rx with h1 as  rec up to qid chlorpheniramine  Sleep flat bed / 2 pillows ok  SABA use: started symbicort 80 @ 80 2bid at onset  plus  albuterol tid  02: none - sats as low as 86% reported  Rec For nasal and chest congestion >  Mucinex D as needed as per bottle >>>   use the flutter valve as much as possible to help bring up mucus  Finish your zpak and call if mucus is not turning clear by 1st of week For cough for any reason omeprazole 20mg   Take 30-60 min before first and last meal of the day until cough is gone  For breathing take albuterol 2 puffs every 4 hours as needed Practice with empty symbicort device : Work on inhaler technique:   Depomedrol 120 mg IM   PC 11/22/22 I feel better but certainly not a 100%. The other meds , Omeprazole,  Prednisone, Mucinex D are still on along with the Symbicort & Albuterol. I have been using the Flutter too to help bring up the mucus  Rec > return to office with all active meds    12/01/2022  ACUTE / work in  ov/[name] re: recurrent cough maint on symbicort 80 2bid  / brought meds Chief Complaint  Patient presents with   Acute Visit    Persistent cough with clear mucus.Flutter valve helped clear mucus from lungs   Dyspnea:  walking across campus ok but slow er pace than usual  Cough: clear  Sleeping: bed is flat  2 pillows s resp cc  SABA use: 4-6 x per day ? Wasn't I supposed to?" 02: none      No obvious day to day or daytime variability or assoc  purulent sputum or mucus plugs or hemoptysis or cp or chest tightness, subjective wheeze or overt sinus or hb symptoms.   Sleeping  without nocturnal  or early am exacerbation  of respiratory  c/o's or need for noct saba. Also denies any obvious fluctuation of symptoms with weather or environmental changes or other aggravating or alleviating factors except as outlined above   No unusual exposure hx or h/o childhood pna/ asthma or knowledge of premature birth.  Current Allergies, Complete Past Medical History,  Past Surgical History, Family History, and Social History were reviewed in [employer] record.  ROS  The following are not active complaints unless bolded Hoarseness, sore throat, dysphagia, dental problems, itching, sneezing,  nasal congestion or discharge of excess mucus or purulent secretions, ear ache,   fever, chills, sweats, unintended wt loss or wt gain, classically pleuritic or exertional cp,  orthopnea pnd or arm/hand swelling  or leg swelling, presyncope, palpitations, abdominal pain, anorexia, nausea, vomiting, diarrhea  or change in bowel habits or change in bladder habits, change in stools or change in urine, dysuria, hematuria,  rash, arthralgias, visual complaints, headache, numbness, weakness or ataxia or problems with walking or coordination,  change in mood or  memory.        Current Meds  Medication Sig   acetaminophen (TYLENOL) 325 MG tablet Take 325 mg by mouth every 4 (four) hours as needed.   albuterol (VENTOLIN HFA) 108 (90 Base) MCG/ACT inhaler Inhale 2 puffs into the lungs every 4 (four) hours as needed for wheezing or shortness of breath.   b complex vitamins capsule Take 1 capsule by mouth daily.   budesonide-formoterol (SYMBICORT) 80-4.5 MCG/ACT inhaler Inhale 2 puffs into the lungs in the morning and at bedtime. SMARTSIG:By Mouth   Calcium Carbonate-Vitamin D (CALCIUM-VITAMIN D3 PO) Take 2 tablets by mouth daily.   carvedilol (COREG) 6.25 MG tablet TAKE ONE TABLET BY MOUTH TWICE DAILY   chlorpheniramine (CHLOR-TRIMETON) 4 MG tablet Take 4 mg by mouth every 4 (four) hours as needed for allergies.   Cholecalciferol (DIALYVITE VITAMIN D 5000 PO) Take 5,000 Units by mouth daily.   Coenzyme Q10 300 MG CAPS Take by mouth.   cyclobenzaprine (FLEXERIL) 10 MG tablet Take 1 tablet (10 mg total) by mouth 3 (three) times daily as needed for muscle spasms.   ferrous sulfate 325 (65 FE) MG EC tablet Take 325 mg by mouth daily with breakfast.   losartan  (COZAAR) 50 MG tablet TAKE ONE-HALF TABLET BY MOUTH TWICE A DAY(TOTAL 50MG  PER DAY)   methylPREDNISolone (MEDROL DOSEPAK) 4 MG TBPK tablet As directed   Misc Natural Products (TURMERIC CURCUMIN) CAPS Take 1 tablet by mouth  2 (two) times daily.   omeprazole (PRILOSEC) 20 MG capsule Take 20 mg by mouth 2 (two) times daily before a meal.   pseudoephedrine-guaifenesin (MUCINEX D) 60-600 MG 12 hr tablet Take 1 tablet by mouth every 12 (twelve) hours.   rosuvastatin (CRESTOR) 10 MG tablet TAKE ONE TABLET BY MOUTH ONE TIME DAILY                    Objective:   Physical Exam   wts  12/01/2022         158  11/18/2022       161  10/04/2021         159  08/06/2021         161 06/11/2021     155  05/28/2021     157   12/19/17 153 lb (69.4 kg)  12/11/17 157 lb (71.2 kg)  11/23/17 158 lb 12.8 oz (72 kg)     Vital signs reviewed  12/01/2022  - Note at rest 02 sats  98% on RA   General appearance:    amb pleasant wf with very congested sounding cough    HEENT : Oropharynx  clear     Nasal turbinates mod edema/ no obvious polyps or purulent secretions   NECK :  without  apparent JVD/ palpable Nodes/TM    LUNGS: no acc muscle use,  Nl contour chest which is clear to A and P bilaterally without cough on insp or exp maneuvers   CV:  RRR  no s3 or murmur or increase in P2, and no edema   ABD:  soft and nontender with nl inspiratory excursion in the supine position. No bruits or organomegaly appreciated   MS:  Nl gait/ ext warm without deformities Or obvious joint restrictions  calf tenderness, cyanosis or clubbing    SKIN: warm and dry without lesions    NEURO:  alert, approp, nl sensorium with  no motor or cerebellar deficits apparent.          Assessment:

## 2022-12-01 ENCOUNTER — Ambulatory Visit: Payer: BC Managed Care – PPO | Admitting: Internal Medicine

## 2022-12-01 ENCOUNTER — Encounter: Payer: Self-pay | Admitting: Internal Medicine

## 2022-12-01 VITALS — BP 136/74 | HR 87 | Temp 98.2°F | Ht <= 58 in | Wt 158.6 lb

## 2022-12-01 DIAGNOSIS — J45991 Cough variant asthma: Secondary | ICD-10-CM | POA: Diagnosis not present

## 2022-12-01 DIAGNOSIS — I10 Essential (primary) hypertension: Secondary | ICD-10-CM | POA: Diagnosis not present

## 2022-12-01 LAB — POCT EXHALED NITRIC OXIDE: FeNO level (ppb): 7

## 2022-12-01 MED ORDER — METHYLPREDNISOLONE ACETATE 80 MG/ML IJ SUSP
120.0000 mg | Freq: Once | INTRAMUSCULAR | Status: AC
Start: 2022-12-01 — End: 2022-12-01
  Administered 2022-12-01: 120 mg via INTRAMUSCULAR

## 2022-12-01 MED ORDER — AIRSUPRA 90-80 MCG/ACT IN AERO: 2.0000 | INHALATION_SPRAY | RESPIRATORY_TRACT | 11 refills | Status: DC | PRN

## 2022-12-01 MED ORDER — BISOPROLOL FUMARATE 5 MG PO TABS
5.0000 mg | ORAL_TABLET | Freq: Every day | ORAL | 11 refills | Status: DC
Start: 2022-12-01 — End: 2023-11-15

## 2022-12-01 NOTE — Assessment & Plan Note (Addendum)
Changed coreg to bisoprolol 12/01/2022 due to refractory AB   >>> rx bisoprolol 5 mg daily          Each maintenance medication was reviewed in detail including emphasizing most importantly the difference between maintenance and prns and under what circumstances the prns are to be triggered using an action plan format where appropriate.  Total time for H and P, chart review, counseling, reviewing  and generating customized AVS unique to this office visit / same day charting  > 30 min acute eval

## 2022-12-01 NOTE — Patient Instructions (Addendum)
Plan A = Automatic = Always=    Symbicort 80 Take 2 puffs first thing in am and then another 2 puffs about 12 hours later.    Work on inhaler technique:  relax and gently blow all the way out then take a nice smooth full deep breath back in, triggering the inhaler at same time you start breathing in.  Hold breath in for at least  5 seconds if you can. Blow out symbicort 80 thru nose. Rinse and gargle with water when done.  If mouth or throat bother you at all,  try brushing teeth/gums/tongue with arm and hammer toothpaste/ make a slurry and gargle and spit out.   Plan B = Backup (to supplement plan A, not to replace it) Only use your air supra  inhaler  up to 2 puffs  every 4 hours as needed  for cough congestion or difficulty   Depomedrol 120 mg IM   Stop carvedilol  and replace with Bisoprolol 5 mg one daily   My office will be contacting you by phone for referral for High resolution CT and sinus CT   - if you don't hear back from my office within one week please call us back or notify us thru MyChart and we'll address it right away.   Late add: Augmentin 875 mg take one pill twice daily  X 10 days - take at breakfast and supper with large glass of water.  It would help reduce the usual side effects (diarrhea and yeast infections) if you ate cultured yogurt at lunch.

## 2022-12-02 ENCOUNTER — Telehealth: Payer: Self-pay | Admitting: Internal Medicine

## 2022-12-02 ENCOUNTER — Encounter: Payer: Self-pay | Admitting: Internal Medicine

## 2022-12-02 MED ORDER — AMOXICILLIN-POT CLAVULANATE 875-125 MG PO TABS: 1.0000 | ORAL_TABLET | Freq: Two times a day (BID) | ORAL | 0 refills | Status: DC

## 2022-12-02 NOTE — Telephone Encounter (Signed)
Reviewing her case, I believe the only abx she got was a zpak which may have missed the mark > rec  augmentin x 10 days awaiting scans  Augmentin 875 mg take one pill twice daily  X 10 days - take at breakfast and supper with large glass of water.  It would help reduce the usual side effects (diarrhea and yeast infections) if you ate cultured yogurt at lunch.

## 2022-12-02 NOTE — Telephone Encounter (Signed)
I called and spoke with the pt and notified of below recommendations from Dr Sherene Sires. She verbalized understanding. Nothing further needed. Rx for augmentin sent to pharm.

## 2022-12-02 NOTE — Assessment & Plan Note (Addendum)
Onset around 2014 FENO 12/19/2017  = 10 on singulair  symb 160 2bid but none on day of ov and hfa baseline quite poor  - 12/19/2017  After extensive coaching inhaler device  effectiveness =    50% from a baseline of 0> try symb 80 2bid x 2 week sample   - Allergy profile 12/19/2017 >  Eos 0.1 /  IgE  44 RAST neg - 06/11/2021    continue symbicort 80 2bid  - 08/06/2021  After extensive coaching inhaler device,  effectiveness =    80%  -  08/06/2021  Gabapentin 100 mg three times daily >>> could not tolerate  - 10/04/2021 rec try off symbicort then h1 blocker   -  12/01/2022  After extensive coaching inhaler device,  effectiveness =    75% > continue symbicort 80  and add air supra prn  - FENO  12/01/2022  = 7 - Chest and sinus ct ordered 12/01/2022 and changed coreg to bisoprolol plus 10 d augmentin   DDX of  difficult airways management almost all start with A and  include Adherence, Ace Inhibitors, Acid Reflux, Active Sinus Disease, Alpha 1 Antitripsin deficiency, Anxiety masquerading as Airways dz,  ABPA,  Allergy(esp in young), Aspiration (esp in elderly), Adverse effects of meds,  Active smoking or vaping, A bunch of PE's (a small clot burden can't cause this syndrome unless there is already severe underlying pulm or vascular dz with poor reserve) plus two Bs  = Bronchiectasis and Beta blocker use..and one C= CHF   Adherence is always the initial "prime suspect" and is a multilayered concern that requires a "trust but verify" approach in every patient - starting with knowing how to use medications, especially inhalers, correctly, keeping up with refills and understanding the fundamental difference between maintenance and prns vs those medications only taken for a very short course and then stopped and not refilled.  - confirmed compliance with intense med bottle review - see hfa teaching / misunderstood albutero use/clarified  ? Acid (or non-acid) GERD > always difficult to exclude as up to 75% of pts in  some series report no assoc GI/ Heartburn symptoms> rec continue max (24h)  acid suppression and diet restrictions/ reviewed     ? Allergy > feno very low noted so less likely/ leave symb 80 but supplement with air supra and depo 120 but no more systemic steroids for now  ? Active sinus dz > mucinex d/  augmentin and f/u with sinus ct  ? BB effects In the setting of respiratory symptoms of unknown etiology,  It would be preferable to use bystolic, the most beta -1  selective Beta blocker available in sample form, with bisoprolol the most selective generic choice  on the market, at least on a trial basis, to make sure the spillover Beta 2 effects of the less specific Beta blockers are not contributing to this patient's symptoms.  >>> see hbp  ? Bronchiectasis > HRCT next / continue flutter and mucinex D

## 2022-12-05 ENCOUNTER — Encounter: Payer: Self-pay | Admitting: Interventional Cardiology

## 2022-12-05 ENCOUNTER — Telehealth: Payer: Self-pay

## 2022-12-05 DIAGNOSIS — J45991 Cough variant asthma: Secondary | ICD-10-CM

## 2022-12-05 NOTE — Telephone Encounter (Signed)
-----   Message from Nyoka Cowden, MD sent at 12/05/2022 10:23 AM EDT ----- Regarding: RE: NEW ORDER Will do  ----- Message ----- From: Kelly Splinter Sent: 12/05/2022  10:17 AM EDT To: Nyoka Cowden, MD Subject: NEW ORDER                                      Good morning, Please submit a new order for CT MAXILLOFACIAL as the correct CPT Code is [phone]. Thanks

## 2022-12-05 NOTE — Telephone Encounter (Signed)
Order placed

## 2022-12-05 NOTE — Telephone Encounter (Signed)
Pt reports medication change from carvedilol 6.25 mg bid to bisoprolol 5 mg once a day. Change made by Nyoka Cowden, MD pts pulmonologist during office visit on 12/01/22.

## 2022-12-07 ENCOUNTER — Other Ambulatory Visit: Payer: Self-pay | Admitting: Internal Medicine

## 2022-12-12 ENCOUNTER — Ambulatory Visit (HOSPITAL_BASED_OUTPATIENT_CLINIC_OR_DEPARTMENT_OTHER)
Admission: RE | Admit: 2022-12-12 | Discharge: 2022-12-12 | Disposition: A | Discharge status: Home / Self Care | Payer: BC Managed Care – PPO | Source: Ambulatory Visit | Attending: Internal Medicine | Admitting: Internal Medicine

## 2022-12-12 DIAGNOSIS — R059 Cough, unspecified: Secondary | ICD-10-CM | POA: Diagnosis not present

## 2022-12-12 DIAGNOSIS — R918 Other nonspecific abnormal finding of lung field: Secondary | ICD-10-CM | POA: Diagnosis not present

## 2022-12-12 DIAGNOSIS — J45991 Cough variant asthma: Secondary | ICD-10-CM | POA: Insufficient documentation

## 2022-12-15 ENCOUNTER — Telehealth: Payer: Self-pay | Admitting: Internal Medicine

## 2022-12-15 NOTE — Telephone Encounter (Signed)
Sinus ct results: Julia Cowden, MD 12/13/2022  6:10 AM EDT     Call patient :  Study is unremarkable, no change in recs   Results of chest scan still pending    Called the pt and there was no answer- LMTCB.

## 2022-12-15 NOTE — Telephone Encounter (Signed)
Pt called the office and I let her know the info about the results of the CT of sinuses and let her know that the scan of her chest was not back yet. Let her know once we had the results of the chest CT back that we would call her to go over those results with her and she verbalized understanding. Nothing further needed.

## 2022-12-15 NOTE — Telephone Encounter (Signed)
Pt is calling about her CT results of the lungs and sinuses to determine further what she needs to do. Pt is going out of town on Sunday and needs to know what she should have to do before then.

## 2022-12-19 ENCOUNTER — Other Ambulatory Visit (HOSPITAL_BASED_OUTPATIENT_CLINIC_OR_DEPARTMENT_OTHER): Payer: BC Managed Care – PPO

## 2022-12-28 ENCOUNTER — Telehealth: Payer: Self-pay | Admitting: Internal Medicine

## 2022-12-28 ENCOUNTER — Encounter: Payer: Self-pay | Admitting: Internal Medicine

## 2022-12-28 NOTE — Telephone Encounter (Signed)
Pt calling to get her CT Scan Results and to see if she actually needs an appt. If she doesn't please cancel 07/01/ at 8:45.

## 2022-12-29 NOTE — Telephone Encounter (Signed)
Ok to prescribe mucinex d one bid x 30 pills and ov before use them up with all meds in hand to regroup

## 2022-12-29 NOTE — Telephone Encounter (Addendum)
CT results have been discussed with the patient. Please see Patient Message from 12/28/2022.   Nothing further needed.

## 2023-01-05 ENCOUNTER — Telehealth: Payer: Self-pay | Admitting: Internal Medicine

## 2023-01-05 MED ORDER — BUDESONIDE-FORMOTEROL FUMARATE 80-4.5 MCG/ACT IN AERO: 2.0000 | INHALATION_SPRAY | Freq: Two times a day (BID) | RESPIRATORY_TRACT | 11 refills | Status: DC

## 2023-01-05 MED ORDER — PSEUDOEPHEDRINE-GUAIFENESIN ER 60-600 MG PO TB12: 1.0000 | ORAL_TABLET | Freq: Two times a day (BID) | ORAL | 0 refills | Status: DC

## 2023-01-05 NOTE — Telephone Encounter (Signed)
Pt needs Mucinex 1200mg  sent to Pharmacy

## 2023-01-06 NOTE — Telephone Encounter (Signed)
ATC X1 LVM for patient. Please advise mucinex was sent to [facility] pharmacy yesterday

## 2023-01-29 NOTE — Progress Notes (Deleted)
Subjective:     Patient ID: [name], female   DOB: [birth year]   MRN: [id number]    History of Present Illness  62 yo [place] female Interior and spatial designer of dining for [employer] never smoker with tendency to cough toward end of trips to [place] that  last 2-3 weeks worse after travel back to [place] x 3 trips starting around 2014 referred to pulmonary clinic 12/19/2017 by Dr Fabian Sharp with new cough started end of March 2019 s antecedent travel    During the pollen season has noted cough/ wheeze in past but this is different sensation   12/19/2017 1st [facility] visit/ [name]   Chief Complaint  Patient presents with   Pulmonary Consult    Referred by Dr. Fabian Sharp. Pt c/o SOB for the past 2 months. She gets winded if she talks to much. She also gets SOB with walking but unable to say how far she walks before she notices a problem.   cough came on abruptly and of March 2018 with rhinitis/nasal discharge/ some sneezing with subjective wheezing   rx pred/delsym/ proair (not very helpful) changed to symb/singulair and gradually improved but still harsh coughing fits sporadic pattern        Kouffman Reflux v Neurogenic Cough Differentiator Reflux Comments  Do you awaken from a sound sleep coughing violently?                            With trouble breathing? Yes   Do you have choking episodes when you cannot  Get enough air, gasping for air ?              NO    Do you usually cough when you lie down into  The bed, or when you just lie down to rest ?                          Not really    Do you usually cough after meals or eating?         Worse    Do you cough when (or after) you bend over?    No    GERD SCORE     Kouffman Reflux v Neurogenic Cough Differentiator Neurogenic   Do you more-or-less cough all day long? Sporadic    Does change of temperature make you cough? no   Does laughing or chuckling cause you to cough? no   Do fumes (perfume, automobile fumes, burned  Toast, etc.,)  cause you to cough ?      no   Does speaking, singing, or talking on the phone cause you to cough   ?               No    Neurogenic/Airway score         Unless coughing not sob  Rec symbicort change to 80 strength and take up to 2 every 12 hours if it's helping  Work on inhaler technique:   First take delsym two tsp every 12 hours and supplement if needed with  tramadol 50 mg up to 1 every 4 hours to suppress the urge to cough at all or even clear your throat.  Prednisone 10 mg take  4 each am x 2 days,   2 each am x 2 days,  1 each am x 2 days and stop (this is to eliminate allergies and inflammation from coughing) Protonix (pantoprazole)  Take 30-60 min before first meal of the day and Pepcid 20 mg one bedtime plus chlorpheniramine 4 mg x 2 at bedtime (both available over the counter)  until cough is completely gone for at least a week without the need for cough suppression GERD diet reviewed, bed blocks       05/28/2021 Re-establish /[name] re: daily throat clearing 2014 never completely resolved then recurrent severe cough flare p covid Mid Sep 2022 maint on prilosec 20 mg one before supper   Chief Complaint  Patient presents with   Pulmonary Consult    04/19/21 dx with covid- sore throat and cough- tx with antiviral. Cough has lingered since then and now wheezing- pred helps but symptoms return one she completes med. She has been using her albuterol inhaler every 4 hours.   Dyspnea:  nl activities no trouble / not aerobically active  Cough: worse when lies down but present daytime also  / min clear mucus  Sleeping: bed is flat/ on side / does disturb sleep with subj wheezing supine   SABA use: not helping much  02: none  Covid status:  vax x 3  Rec Prednisone 10 mg take  4 each am x 2 days,   2 each am x 2 days,  1 each am x 2 days and stop    The key to effective treatment for your cough is eliminating the non-stop cycle of cough you're stuck in First take delsym two tsp every 12  hours and supplement if needed with  tramadol 50 mg up to 1 every 4 hours    Protonix (pantoprazole) Take 30-60 min before first meal of the day and Pepcid 20 mg one bedtime plus chlorpheniramine 4 mg x 2 at bedtime (both available over the counter)  until cough is completely gone for at least a week without the need for cough suppression GERD rx If the cough starts back up as you taper the prednisone or need more albuterol then start the symbicort 80 Take 2 puffs first thing in am and then another 2 puffs about 12 hours later.  Work on inhaler technique:   06/11/2021  f/u ov/[name] re: uacs vs cough variant asthma  maint on symb 80 2bid (50% technique) not taking gerd rx correctly and has only used 9 tramadol total in last 14 days Chief Complaint  Patient presents with   Follow-up    Pt states cough with mucous   Dyspnea:  Not limited by breathing from desired activities   Cough:  more day than noct as takes tramadol at hs and really min mucoid sputum production  Sleeping: does fine once asleep,flat SABA use: not using but thinks it may have helped  02: none  Wheezing bothers her towards bedtime and persisted even on prednisone  Rec Plan A = Automatic = Always=    Symbicort 80 Take 2 puffs first thing in am and then another 2 puffs about 12 hours later.  Work on inhaler technique:   Plan B = Backup (to supplement plan A, not to replace it) for breathing/ wheezing  Only use your albuterol inhaler as a rescue medication For drainage / throat tickle try take CHLORPHENIRAMINE  4 mg   If A and B aren't working > Prednisone 10 mg take  4 each am x 2 days,   2 each am x 2 days,  1 each am x 2 days and stop  Keep your previous appointment      08/06/2021  f/u  ov/[name] re: asthma/uacs/ flare while in [place] exp to Perfume   maint on symbort 80 2bid / 1st gen H1 blockers/ gerd rx/ never took prednisone as above Chief Complaint  Patient presents with   Follow-up    Just returned home from [place]- c/o  minimal cough and wheezing. Her cough is prod with clear sputum.    Dyspnea:  Not limited by breathing from desired activities   Cough: dry/better p saba/ never really stopped throat clearing p last ov per husband Sleeping: flat bed/ on side no resp cc SABA use:  tid ? If really helps  02: none  Covid status:vax x 4  Rec Prednisone 10 mg take  4 each am x 2 days,   2 each am x 2 days,  1 each am x 2 days and stop  Take delsym two tsp every 12 hours and supplement if needed with  tramadol 50 mg up to 2 every 4 hours to suppress the urge to cough. Once you have eliminated the cough for 3 straight days try reducing the tramadol first,  then the delsym as tolerated.   Gabapentin 100 mg three times daily     10/04/2021  f/u ov/[name] re: cough variant / uacs  maint on gerd rx/ h1 and symb 80   Chief Complaint  Patient presents with   Follow-up    Cough and wheezing have resolved. Breathing is doing well. No new co's.  Dyspnea:  cleaning neighborhood and walking daily / no aerobics  Cough: gone  Sleeping: fine flat SABA use: none  02: none  Covid status:   vax x 4  Rec Omeprazole can be resumed twice daily as you were before  Stop pantoprazole and famotidine  Stop symbicort 1st but resume full dose at rist sign resume immediately for a week  After a full week off symbicort >>> For drainage / throat tickle tAS NEEDED take CHLORPHENIRAMINE  4 mg   Pulmonary follow up is as needed     11/18/2022  Acute  ov/[name]/[name] re: recurrent cough / had maint on h1 hs/ no h2 or ppi or symbicort and did fine since last ov  then acutely worse since 11/16/22   Chief Complaint  Patient presents with   Acute Visit    Cough, wheezing and SOB since Wednesday.  Dyspnea:  since onset of recurrent cough before that was fine off symbicort Cough: very congested sounding turing darker yellow/green rx with zpak by pcp 4/18  Assoc   with nasal congestion  started with st/  tickle and self rx with h1 as  rec up to qid chlorpheniramine  Sleep flat bed / 2 pillows ok  SABA use: started symbicort 80 @ 80 2bid at onset  plus  albuterol tid  02: none - sats as low as 86% reported  Rec For nasal and chest congestion >  Mucinex D as needed as per bottle >>>   use the flutter valve as much as possible to help bring up mucus  Finish your zpak and call if mucus is not turning clear by 1st of week For cough for any reason omeprazole 20mg   Take 30-60 min before first and last meal of the day until cough is gone  For breathing take albuterol 2 puffs every 4 hours as needed Practice with empty symbicort device : Work on inhaler technique:   Depomedrol 120 mg IM   PC 11/22/22 I feel better but certainly not a 100%. The other meds , Omeprazole,  Prednisone, Mucinex D are still on along with the Symbicort & Albuterol. I have been using the Flutter too to help bring up the mucus  Rec > return to [facility] with all active meds    12/01/2022  ACUTE / work in  ov/[name] re: recurrent cough maint on symbicort 80 2bid  / brought meds Chief Complaint  Patient presents with   Acute Visit    Persistent cough with clear mucus.Flutter valve helped clear mucus from lungs   Dyspnea:  walking across campus ok but slow er pace than usual  Cough: clear  Sleeping: bed is flat  2 pillows s resp cc  SABA use: 4-6 x per day ? Wasn't I supposed to?" 02: none  Rec Plan A = Automatic = Always=    Symbicort 80 Take 2 puffs first thing in am and then another 2 puffs about 12 hours later.  Work on inhaler technique:   Plan B = Backup (to supplement plan A, not to replace it) Only use your air supra  inhaler  up to 2 puffs  every 4 hours as needed  for cough congestion or difficulty Depomedrol 120 mg IM  Stop carvedilol  and replace with Bisoprolol 5 mg one daily  Late add: Augmentin 875 mg take one pill twice daily  X 10 days   - 12/12/22  Sinus CT nl  - 12/22/22  [facility] nl x increase PA  > note PA  pressures nl 11/2021    01/30/2023   f/u ov/[name]/[name] re: uacs vs cough variant asthma  maint on ***  No chief complaint on file.   Dyspnea:  *** Cough: *** Sleeping: *** SABA use: *** 02: *** Covid status: *** Lung cancer screening: ***   No obvious day to day or daytime variability or assoc excess/ purulent sputum or mucus plugs or hemoptysis or cp or chest tightness, subjective wheeze or overt sinus or hb symptoms.   *** without nocturnal  or early am exacerbation  of respiratory  c/o's or need for noct saba. Also denies any obvious fluctuation of symptoms with weather or environmental changes or other aggravating or alleviating factors except as outlined above   No unusual exposure hx or h/o childhood pna/ asthma or knowledge of premature birth.  Current Allergies, Complete Past Medical History, Past Surgical History, Family History, and Social History were reviewed in [employer] record.  ROS  The following are not active complaints unless bolded Hoarseness, sore throat, dysphagia, dental problems, itching, sneezing,  nasal congestion or discharge of excess mucus or purulent secretions, ear ache,   fever, chills, sweats, unintended wt loss or wt gain, classically pleuritic or exertional cp,  orthopnea pnd or arm/hand swelling  or leg swelling, presyncope, palpitations, abdominal pain, anorexia, nausea, vomiting, diarrhea  or change in bowel habits or change in bladder habits, change in stools or change in urine, dysuria, hematuria,  rash, arthralgias, visual complaints, headache, numbness, weakness or ataxia or problems with walking or coordination,  change in mood or  memory.        No outpatient medications have been marked as taking for the 01/30/23 encounter (Appointment) with Nyoka Cowden, MD.                    Objective:   Physical Exam   wts  01/30/2023          ***  12/01/2022         158  11/18/2022  161  10/04/2021         159  08/06/2021         161 06/11/2021      155  05/28/2021     157   12/19/17 153 lb (69.4 kg)  12/11/17 157 lb (71.2 kg)  11/23/17 158 lb 12.8 oz (72 kg)    Vital signs reviewed  01/30/2023  - Note at rest 02 sats  ***% on ***   General appearance:    ***    Assessment:

## 2023-01-30 ENCOUNTER — Ambulatory Visit: Payer: BC Managed Care – PPO | Admitting: Internal Medicine

## 2023-02-10 ENCOUNTER — Encounter: Payer: Self-pay | Admitting: Internal Medicine

## 2023-02-10 ENCOUNTER — Ambulatory Visit: Payer: BC Managed Care – PPO | Admitting: Internal Medicine

## 2023-02-10 VITALS — BP 118/64 | HR 77 | Temp 97.4°F | Ht <= 58 in | Wt 163.8 lb

## 2023-02-10 DIAGNOSIS — J45991 Cough variant asthma: Secondary | ICD-10-CM

## 2023-02-10 NOTE — Patient Instructions (Addendum)
Symbicort 80  up to 2 puffs every 12 hours as needed   If doing great off symbicort for a few weeks, ok to stop omeprazole am dose only for a few weeks then the pm dose but replace this with famotidine 20 mg over the counter for a few weeks and resume full dose of omeprazole twice daily   Pulmonary follow up is as needed

## 2023-02-10 NOTE — Progress Notes (Unsigned)
Subjective:     Patient ID: [name], female   DOB: 05-14-1961   MRN: [id number]    History of Present Illness  62 yo [place] female Interior and spatial designer of dining for [employer] never smoker with tendency to cough toward end of trips to [place] that  last 2-3 weeks worse after travel back to [place] x 3 trips starting around 2014 referred to pulmonary clinic 12/19/2017 by Dr Fabian Sharp with new cough started end of March 2019 s antecedent travel    During the pollen season has noted cough/ wheeze in past but this is different sensation   12/19/2017 1st  Pulmonary [facility]iz visit/ [name]   Chief Complaint  Patient presents with   Pulmonary Consult    Referred by Dr. Fabian Sharp. Pt c/o SOB for the past 2 months. She gets winded if she talks to much. She also gets SOB with walking but unable to say how far she walks before she notices a problem.   cough came on abruptly and of March 2018 with rhinitis/nasal discharge/ some sneezing with subjective wheezing   rx pred/delsym/ proair (not very helpful) changed to symb/singulair and gradually improved but still harsh coughing fits sporadic pattern        Kouffman Reflux v Neurogenic Cough Differentiator Reflux Comments  Do you awaken from a sound sleep coughing violently?                            With trouble breathing? Yes   Do you have choking episodes when you cannot  Get enough air, gasping for air ?              NO    Do you usually cough when you lie down into  The bed, or when you just lie down to rest ?                          Not really    Do you usually cough after meals or eating?         Worse    Do you cough when (or after) you bend over?    No    GERD SCORE     Kouffman Reflux v Neurogenic Cough Differentiator Neurogenic   Do you more-or-less cough all day long? Sporadic    Does change of temperature make you cough? no   Does laughing or chuckling cause you to cough? no   Do fumes (perfume, automobile fumes, burned  Toast, etc.,)  cause you to cough ?      no   Does speaking, singing, or talking on the phone cause you to cough   ?               No    Neurogenic/Airway score         Unless coughing not sob  Rec symbicort change to 80 strength and take up to 2 every 12 hours if it's helping  Work on inhaler technique:   First take delsym two tsp every 12 hours and supplement if needed with  tramadol 50 mg up to 1 every 4 hours to suppress the urge to cough at all or even clear your throat.  Prednisone 10 mg take  4 each am x 2 days,   2 each am x 2 days,  1 each am x 2 days and stop (this is to eliminate allergies and inflammation from coughing)  Protonix (pantoprazole) Take 30-60 min before first meal of the day and Pepcid 20 mg one bedtime plus chlorpheniramine 4 mg x 2 at bedtime (both available over the counter)  until cough is completely gone for at least a week without the need for cough suppression GERD diet reviewed, bed blocks       05/28/2021 Re-establish /[name] re: daily throat clearing 2014 never completely resolved then recurrent severe cough flare p covid Mid Sep 2022 maint on prilosec 20 mg one before supper   Chief Complaint  Patient presents with   Pulmonary Consult    04/19/21 dx with covid- sore throat and cough- tx with antiviral. Cough has lingered since then and now wheezing- pred helps but symptoms return one she completes med. She has been using her albuterol inhaler every 4 hours.   Dyspnea:  nl activities no trouble / not aerobically active  Cough: worse when lies down but present daytime also  / min clear mucus  Sleeping: bed is flat/ on side / does disturb sleep with subj wheezing supine   SABA use: not helping much  02: none  Covid status:  vax x 3  Rec Prednisone 10 mg take  4 each am x 2 days,   2 each am x 2 days,  1 each am x 2 days and stop    The key to effective treatment for your cough is eliminating the non-stop cycle of cough you're stuck in First take delsym two tsp every 12  hours and supplement if needed with  tramadol 50 mg up to 1 every 4 hours    Protonix (pantoprazole) Take 30-60 min before first meal of the day and Pepcid 20 mg one bedtime plus chlorpheniramine 4 mg x 2 at bedtime (both available over the counter)  until cough is completely gone for at least a week without the need for cough suppression GERD rx If the cough starts back up as you taper the prednisone or need more albuterol then start the symbicort 80 Take 2 puffs first thing in am and then another 2 puffs about 12 hours later.  Work on inhaler technique:   06/11/2021  f/u ov/[name] re: uacs vs cough variant asthma  maint on symb 80 2bid (50% technique) not taking gerd rx correctly and has only used 9 tramadol total in last 14 days Chief Complaint  Patient presents with   Follow-up    Pt states cough with mucous   Dyspnea:  Not limited by breathing from desired activities   Cough:  more day than noct as takes tramadol at hs and really min mucoid sputum production  Sleeping: does fine once asleep,flat SABA use: not using but thinks it may have helped  02: none  Wheezing bothers her towards bedtime and persisted even on prednisone  Rec Plan A = Automatic = Always=    Symbicort 80 Take 2 puffs first thing in am and then another 2 puffs about 12 hours later.  Work on inhaler technique:   Plan B = Backup (to supplement plan A, not to replace it) for breathing/ wheezing  Only use your albuterol inhaler as a rescue medication For drainage / throat tickle try take CHLORPHENIRAMINE  4 mg   If A and B aren't working > Prednisone 10 mg take  4 each am x 2 days,   2 each am x 2 days,  1 each am x 2 days and stop  Keep your previous appointment      08/06/2021  f/u ov/[name] re: asthma/uacs/ flare while in [place] exp to Perfume   maint on symbort 80 2bid / 1st gen H1 blockers/ gerd rx/ never took prednisone as above Chief Complaint  Patient presents with   Follow-up    Just returned home from [place]- c/o  minimal cough and wheezing. Her cough is prod with clear sputum.    Dyspnea:  Not limited by breathing from desired activities   Cough: dry/better p saba/ never really stopped throat clearing p last ov per husband Sleeping: flat bed/ on side no resp cc SABA use:  tid ? If really helps  02: none  Covid status:vax x 4  Rec Prednisone 10 mg take  4 each am x 2 days,   2 each am x 2 days,  1 each am x 2 days and stop  Take delsym two tsp every 12 hours and supplement if needed with  tramadol 50 mg up to 2 every 4 hours to suppress the urge to cough. Once you have eliminated the cough for 3 straight days try reducing the tramadol first,  then the delsym as tolerated.   Gabapentin 100 mg three times daily     10/04/2021  f/u ov/[name] re: cough variant / uacs  maint on gerd rx/ h1 and symb 80   Chief Complaint  Patient presents with   Follow-up    Cough and wheezing have resolved. Breathing is doing well. No new co's.  Dyspnea:  cleaning neighborhood and walking daily / no aerobics  Cough: gone  Sleeping: fine flat SABA use: none  02: none  Covid status:   vax x 4  Rec Omeprazole can be resumed twice daily as you were before  Stop pantoprazole and famotidine  Stop symbicort 1st but resume full dose at rist sign resume immediately for a week  After a full week off symbicort >>> For drainage / throat tickle AS NEEDED take CHLORPHENIRAMINE  4 mg   Pulmonary follow up is as needed     11/18/2022  Acute  ov/[name] re: recurrent cough / had maint on h1 hs/ no h2 or ppi or symbicort and did fine since last ov  then acutely worse since 11/16/22   Chief Complaint  Patient presents with   Acute Visit    Cough, wheezing and SOB since Wednesday.  Dyspnea:  since onset of recurrent cough before that was fine off symbicort Cough: very congested sounding turing darker yellow/green rx with zpak by pcp 4/18  Assoc   with nasal congestion  started with st/  tickle and self rx with h1 as  rec up to qid chlorpheniramine  Sleep flat bed / 2 pillows ok  SABA use: started symbicort 80 @ 80 2bid at onset  plus  albuterol tid  02: none - sats as low as 86% reported  Rec For nasal and chest congestion >  Mucinex D as needed as per bottle >>>   use the flutter valve as much as possible to help bring up mucus  Finish your zpak and call if mucus is not turning clear by 1st of week For cough for any reason omeprazole 20mg   Take 30-60 min before first and last meal of the day until cough is gone  For breathing take albuterol 2 puffs every 4 hours as needed Practice with empty symbicort device : Work on inhaler technique:   Depomedrol 120 mg IM   PC 11/22/22 I feel better but certainly not a 100%. The other meds ,  Omeprazole, Prednisone, Mucinex D are still on along with the Symbicort & Albuterol. I have been using the Flutter too to help bring up the mucus  Rec > return to [facility] with all active meds    12/01/2022  ACUTE / work in  ov/[name] re: recurrent cough maint on symbicort 80 2bid  / brought meds Chief Complaint  Patient presents with   Acute Visit    Persistent cough with clear mucus.Flutter valve helped clear mucus from lungs   Dyspnea:  walking across campus ok but slow er pace than usual  Cough: clear  Sleeping: bed is flat  2 pillows s resp cc  SABA use: 4-6 x per day ? Wasn't I supposed to?" 02: none  Rec Plan A = Automatic = Always=    Symbicort 80 Take 2 puffs first thing in am and then another 2 puffs about 12 hours later.  Work on inhaler technique:   Plan B = Backup (to supplement plan A, not to replace it) Only use your air supra  inhaler  up to 2 puffs  every 4 hours as needed  for cough congestion or difficulty Depomedrol 120 mg IM  Stop carvedilol  and replace with Bisoprolol 5 mg one daily  Late add: Augmentin 875 mg take one pill twice daily  X 10 days   - 12/12/22  Sinus CT nl  - 12/22/22  HRCT nl x increase PA  > note PA  pressures nl 11/2021     02/10/2023  f/u ov/[name]/[name] re: uacs vs cough variant asthma  maint on symb 80 and omeprazole twice daily  ac  Chief Complaint  Patient presents with   Follow-up    States no issues  Dyspnea:  walking across [employer] s doe  Cough: gone  on h1 bid  Sleeping: fine now flat bed  SABA use: none  02: none      No obvious day to day or daytime variability or assoc excess/ purulent sputum or mucus plugs or hemoptysis or cp or chest tightness, subjective wheeze or overt sinus or hb symptoms.   Sleeping  without nocturnal  or early am exacerbation  of respiratory  c/o's or need for noct saba. Also denies any obvious fluctuation of symptoms with weather or environmental changes or other aggravating or alleviating factors except as outlined above   No unusual exposure hx or h/o childhood pna/ asthma or knowledge of premature birth.  Current Allergies, Complete Past Medical History, Past Surgical History, Family History, and Social History were reviewed in [employer] record.  ROS  The following are not active complaints unless bolded Hoarseness, sore throat, dysphagia, dental problems, itching, sneezing,  nasal congestion or discharge of excess mucus or purulent secretions, ear ache,   fever, chills, sweats, unintended wt loss or wt gain, classically pleuritic or exertional cp,  orthopnea pnd or arm/hand swelling  or leg swelling, presyncope, palpitations, abdominal pain, anorexia, nausea, vomiting, diarrhea  or change in bowel habits or change in bladder habits, change in stools or change in urine, dysuria, hematuria,  rash, arthralgias, visual complaints, headache, numbness, weakness or ataxia or problems with walking or coordination,  change in mood or  memory.        Current Meds  Medication Sig   acetaminophen (TYLENOL) 325 MG tablet Take 325 mg by mouth every 4 (four) hours as needed.   Albuterol-Budesonide (AIRSUPRA) 90-80 MCG/ACT AERO Inhale  2 puffs into the lungs every 4 (four) hours as  needed.   b complex vitamins capsule Take 1 capsule by mouth daily.   bisoprolol (ZEBETA) 5 MG tablet Take 1 tablet (5 mg total) by mouth daily.   budesonide-formoterol (SYMBICORT) 80-4.5 MCG/ACT inhaler Inhale 2 puffs into the lungs in the morning and at bedtime. SMARTSIG:By Mouth   Calcium Carbonate-Vitamin D (CALCIUM-VITAMIN D3 PO) Take 2 tablets by mouth daily.   chlorpheniramine (CHLOR-TRIMETON) 4 MG tablet Take 4 mg by mouth every 4 (four) hours as needed for allergies.   Cholecalciferol (DIALYVITE VITAMIN D 5000 PO) Take 5,000 Units by mouth daily.   Coenzyme Q10 300 MG CAPS Take by mouth.   cyclobenzaprine (FLEXERIL) 10 MG tablet Take 1 tablet (10 mg total) by mouth 3 (three) times daily as needed for muscle spasms.   ferrous sulfate 325 (65 FE) MG EC tablet Take 325 mg by mouth daily with breakfast.   losartan (COZAAR) 50 MG tablet Take one-half tablet by mouth twice a day   Misc Natural Products (TURMERIC CURCUMIN) CAPS Take 1 tablet by mouth 2 (two) times daily.   omeprazole (PRILOSEC) 20 MG capsule Take 20 mg by mouth 2 (two) times daily before a meal.   pseudoephedrine-guaifenesin (MUCINEX D) 60-600 MG 12 hr tablet Take 1 tablet by mouth every 12 (twelve) hours.   rosuvastatin (CRESTOR) 10 MG tablet TAKE ONE TABLET BY MOUTH ONE TIME DAILY                    Objective:   Physical Exam   wts  02/10/2023       163  12/01/2022         158  11/18/2022       161  10/04/2021         159  08/06/2021         161 06/11/2021     155  05/28/2021     157   12/19/17 153 lb (69.4 kg)  12/11/17 157 lb (71.2 kg)  11/23/17 158 lb 12.8 oz (72 kg)    Vital signs reviewed  02/10/2023  - Note at rest 02 sats  100% on RA   General appearance:    amb [place] female nad all smiles    HEENT : Oropharynx  clear        NECK :  without  apparent JVD/ palpable Nodes/TM    LUNGS: no acc muscle use,  Nl contour chest which is clear to A and P  bilaterally without cough on insp or exp maneuvers   CV:  RRR  no s3 or murmur or increase in P2, and no edema   ABD:  soft and nontender   MS:  Nl gait/ ext warm without deformities Or obvious joint restrictions  calf tenderness, cyanosis or clubbing    SKIN: warm and dry without lesions    NEURO:  alert, approp, nl sensorium with  no motor or cerebellar deficits apparent.     Assessment:

## 2023-02-11 NOTE — Assessment & Plan Note (Signed)
Onset around 2014 FENO 12/19/2017  = 12 on singulair  symb 160 2bid but none on day of ov and hfa baseline quite poor  - 12/19/2017  After extensive coaching inhaler device  effectiveness =    50% from a baseline of 0> try symb 80 2bid x 2 week sample   - Allergy profile 12/19/2017 >  Eos 0.1 /  IgE  44 RAST neg - 06/11/2021    continue symbicort 80 2bid  - 08/06/2021  After extensive coaching inhaler device,  effectiveness =    80%  -  08/06/2021  Gabapentin 100 mg three times daily >>> could not tolerate  - 10/04/2021 rec try off symbicort then h1 blocker  -  12/01/2022  After extensive coaching inhaler device,  effectiveness =    75% > continue symbicort 80  and add air supra prn  - FENO  12/01/2022  = 7 - Chest and sinus ct ordered 12/01/2022 and changed coreg to bisoprolol plus 10 d augmenint  - 12/12/22  Sinus CT nl  - 12/22/22  HRCT nl x increase PA  > note PA  pressures nl 11/2021   Recurrent cyclical coughing with ? Component of asthma but if so it is very mild so rec change symbicort 80 to prn Based on two studies from NEJM  378; 20 p 1865 (2018) and 380 : p2020-30 (2019) in pts with mild asthma it is reasonable to use low dose symbicort eg 80 2bid "prn" flare in this setting but I emphasized this was only shown with symbicort and takes advantage of the rapid onset of action but is not the same as "rescue therapy" but can be stopped once the acute symptoms have resolved and the need for rescue has been minimized (< 2 x weekly)    Ok to taper off gerd rx as well as long as remembers to immediately max rx for exac  Pulmonary f/u prn          Each maintenance medication was reviewed in detail including emphasizing most importantly the difference between maintenance and prns and under what circumstances the prns are to be triggered using an action plan format where appropriate.  Total time for H and P, chart review, counseling, reviewing hfa device(s) and generating customized AVS unique to this office  visit / same day charting = 20 min summary final ov

## 2023-03-05 ENCOUNTER — Other Ambulatory Visit: Payer: Self-pay | Admitting: Family

## 2023-03-13 ENCOUNTER — Ambulatory Visit: Payer: BC Managed Care – PPO | Admitting: Internal Medicine

## 2023-03-22 ENCOUNTER — Other Ambulatory Visit: Payer: Self-pay

## 2023-03-22 MED ORDER — LOSARTAN POTASSIUM 50 MG PO TABS: 25.0000 mg | ORAL_TABLET | Freq: Two times a day (BID) | ORAL | 0 refills | Status: DC

## 2023-06-06 ENCOUNTER — Ambulatory Visit: Payer: BC Managed Care – PPO | Admitting: Family Medicine

## 2023-06-06 ENCOUNTER — Encounter: Payer: Self-pay | Admitting: Family Medicine

## 2023-06-06 VITALS — BP 118/78 | HR 61 | Temp 98.4°F | Wt 162.2 lb

## 2023-06-06 DIAGNOSIS — G629 Polyneuropathy, unspecified: Secondary | ICD-10-CM | POA: Diagnosis not present

## 2023-06-06 LAB — CBC WITH DIFFERENTIAL/PLATELET
Basophils Absolute: 0.1 10*3/uL (ref 0.0–0.1)
Basophils Relative: 0.7 % (ref 0.0–3.0)
Eosinophils Absolute: 0.3 10*3/uL (ref 0.0–0.7)
Eosinophils Relative: 3.2 % (ref 0.0–5.0)
HCT: 43.7 % (ref 36.0–46.0)
Hemoglobin: 13.9 g/dL (ref 12.0–15.0)
Lymphocytes Relative: 31 % (ref 12.0–46.0)
Lymphs Abs: 2.6 10*3/uL (ref 0.7–4.0)
MCHC: 31.8 g/dL (ref 30.0–36.0)
MCV: 89.5 fL (ref 78.0–100.0)
Monocytes Absolute: 0.6 10*3/uL (ref 0.1–1.0)
Monocytes Relative: 6.5 % (ref 3.0–12.0)
Neutro Abs: 5 10*3/uL (ref 1.4–7.7)
Neutrophils Relative %: 58.6 % (ref 43.0–77.0)
Platelets: 280 10*3/uL (ref 150.0–400.0)
RBC: 4.88 Mil/uL (ref 3.87–5.11)
RDW: 13.3 % (ref 11.5–15.5)
WBC: 8.5 10*3/uL (ref 4.0–10.5)

## 2023-06-06 LAB — BASIC METABOLIC PANEL
BUN: 14 mg/dL (ref 6–23)
CO2: 33 meq/L — ABNORMAL HIGH (ref 19–32)
Calcium: 9.6 mg/dL (ref 8.4–10.5)
Chloride: 101 meq/L (ref 96–112)
Creatinine, Ser: 0.74 mg/dL (ref 0.40–1.20)
GFR: 85.39 mL/min (ref >60.00)
Glucose, Bld: 107 mg/dL — ABNORMAL HIGH (ref 70–99)
Potassium: 4.2 meq/L (ref 3.5–5.1)
Sodium: 141 meq/L (ref 135–145)

## 2023-06-06 LAB — HEPATIC FUNCTION PANEL
ALT: 15 U/L (ref 0–35)
AST: 21 U/L (ref 0–37)
Albumin: 4.1 g/dL (ref 3.5–5.2)
Alkaline Phosphatase: 72 U/L (ref 39–117)
Bilirubin, Direct: 0.1 mg/dL (ref 0.0–0.3)
Total Bilirubin: 0.4 mg/dL (ref 0.2–1.2)
Total Protein: 7.4 g/dL (ref 6.0–8.3)

## 2023-06-06 LAB — HEMOGLOBIN A1C: Hgb A1c MFr Bld: 6.5 % (ref 4.6–6.5)

## 2023-06-06 LAB — TSH: TSH: 2.06 u[IU]/mL (ref 0.35–5.50)

## 2023-06-06 LAB — VITAMIN B12: Vitamin B-12: 591 pg/mL (ref 211–911)

## 2023-06-06 NOTE — Progress Notes (Signed)
   Subjective:    Patient ID: [name], female    DOB: 06/30/1961, 62 y.o.   MRN: [id number]  HPI Here for burning in the bottoms of both feet. This started about 10 days ago. She says it bothers her at night the most. She has done some research about neuropathy, and she asks to check some lab work. Of note her A1c in March was 6.9%.    Review of Systems  Constitutional: Negative.   Respiratory: Negative.    Cardiovascular: Negative.   Neurological:  Negative for weakness and numbness.       Objective:   Physical Exam Constitutional:      Appearance: Normal appearance.  Cardiovascular:     Rate and Rhythm: Normal rate and regular rhythm.     Pulses: Normal pulses.     Heart sounds: Normal heart sounds.  Pulmonary:     Effort: Pulmonary effort is normal.     Breath sounds: Normal breath sounds.  Neurological:     General: No focal deficit present.     Mental Status: She is alert and oriented to person, place, and time.           Assessment & Plan:  Neuropathy. We will get labs today to try to find the etiology of this.  Gershon Crane, MD

## 2023-06-07 MED ORDER — GABAPENTIN 100 MG PO CAPS: 100.0000 mg | ORAL_CAPSULE | Freq: Every day | ORAL | 2 refills | Status: DC

## 2023-06-07 NOTE — Addendum Note (Signed)
Addended by: Gershon Crane A on: 06/07/2023 04:57 PM   Modules accepted: Orders

## 2023-06-12 ENCOUNTER — Other Ambulatory Visit: Payer: Self-pay

## 2023-06-12 MED ORDER — METFORMIN HCL 500 MG PO TABS: 500.0000 mg | ORAL_TABLET | Freq: Two times a day (BID) | ORAL | 5 refills | Status: DC

## 2023-06-18 ENCOUNTER — Other Ambulatory Visit: Payer: Self-pay | Admitting: Internal Medicine

## 2023-06-22 ENCOUNTER — Other Ambulatory Visit: Payer: Self-pay | Admitting: Internal Medicine

## 2023-06-22 DIAGNOSIS — Z0000 Encounter for general adult medical examination without abnormal findings: Secondary | ICD-10-CM
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
## 2023-07-25 ENCOUNTER — Ambulatory Visit (INDEPENDENT_AMBULATORY_CARE_PROVIDER_SITE_OTHER): Payer: BC Managed Care – PPO | Admitting: Internal Medicine

## 2023-07-25 ENCOUNTER — Encounter: Payer: Self-pay | Admitting: Internal Medicine

## 2023-07-25 VITALS — BP 140/70 | HR 70 | Temp 98.1°F | Ht <= 58 in | Wt 164.4 lb

## 2023-07-25 DIAGNOSIS — I10 Essential (primary) hypertension: Secondary | ICD-10-CM

## 2023-07-25 DIAGNOSIS — Z23 Encounter for immunization: Secondary | ICD-10-CM

## 2023-07-25 DIAGNOSIS — E7800 Pure hypercholesterolemia, unspecified: Secondary | ICD-10-CM

## 2023-07-25 DIAGNOSIS — T887XXA Unspecified adverse effect of drug or medicament, initial encounter: Secondary | ICD-10-CM | POA: Diagnosis not present

## 2023-07-25 DIAGNOSIS — Z0000 Encounter for general adult medical examination without abnormal findings: Secondary | ICD-10-CM

## 2023-07-25 DIAGNOSIS — Z2989 Encounter for other specified prophylactic measures: Secondary | ICD-10-CM

## 2023-07-25 DIAGNOSIS — R739 Hyperglycemia, unspecified: Secondary | ICD-10-CM

## 2023-07-25 DIAGNOSIS — Z79899 Other long term (current) drug therapy: Secondary | ICD-10-CM | POA: Diagnosis not present

## 2023-07-25 LAB — COMPREHENSIVE METABOLIC PANEL
ALT: 16 U/L (ref 0–35)
AST: 22 U/L (ref 0–37)
Albumin: 4.3 g/dL (ref 3.5–5.2)
Alkaline Phosphatase: 75 U/L (ref 39–117)
BUN: 15 mg/dL (ref 6–23)
CO2: 30 meq/L (ref 19–32)
Calcium: 9.3 mg/dL (ref 8.4–10.5)
Chloride: 101 meq/L (ref 96–112)
Creatinine, Ser: 0.67 mg/dL (ref 0.40–1.20)
GFR: 92.16 mL/min (ref >60.00)
Glucose, Bld: 111 mg/dL — ABNORMAL HIGH (ref 70–99)
Potassium: 4.3 meq/L (ref 3.5–5.1)
Sodium: 140 meq/L (ref 135–145)
Total Bilirubin: 0.4 mg/dL (ref 0.2–1.2)
Total Protein: 7.4 g/dL (ref 6.0–8.3)

## 2023-07-25 LAB — LIPID PANEL
Cholesterol: 154 mg/dL (ref 0–200)
HDL: 70 mg/dL (ref >39.00)
LDL Cholesterol: 65 mg/dL (ref 0–99)
NonHDL: 84.24
Total CHOL/HDL Ratio: 2
Triglycerides: 95 mg/dL (ref 0.0–149.0)
VLDL: 19 mg/dL (ref 0.0–40.0)

## 2023-07-25 LAB — TSH: TSH: 2.13 u[IU]/mL (ref 0.35–5.50)

## 2023-07-25 MED ORDER — DOXYCYCLINE HYCLATE 100 MG PO TABS
100.0000 mg | ORAL_TABLET | Freq: Every day | ORAL | 0 refills | Status: DC
Start: 2023-07-25 — End: 2023-11-02

## 2023-07-25 MED ORDER — LOSARTAN POTASSIUM 50 MG PO TABS
ORAL_TABLET | ORAL | 3 refills | Status: DC
Start: 2023-07-25 — End: 2023-11-27

## 2023-07-25 NOTE — Patient Instructions (Addendum)
  Good to see you today   Attention to limiting simple sugars .  Lab today and will plan on fu in 3 mos to check hg A1c tests. Can consider other interventions medications as tolerated  Will order doxycycline as in past for malaria prophylaxis .

## 2023-07-27 ENCOUNTER — Encounter: Payer: Self-pay | Admitting: Internal Medicine

## 2023-07-27 NOTE — Telephone Encounter (Signed)
I suggest once a day first and then if tolerated  increase to 1 per day

## 2023-07-27 NOTE — Progress Notes (Signed)
Cholesterol level  in good range  Blood sugar  in pre diabetic range  Rest normal or in range  Continue attention to lifestyle intervention healthy eating and activity and we can check your hg A1c level at next visit

## 2023-07-29 ENCOUNTER — Other Ambulatory Visit: Payer: Self-pay | Admitting: Family

## 2023-07-29 DIAGNOSIS — I10 Essential (primary) hypertension: Secondary | ICD-10-CM

## 2023-07-29 DIAGNOSIS — Z79899 Other long term (current) drug therapy: Secondary | ICD-10-CM

## 2023-07-29 DIAGNOSIS — Z0000 Encounter for general adult medical examination without abnormal findings: Secondary | ICD-10-CM

## 2023-07-29 DIAGNOSIS — E7800 Pure hypercholesterolemia, unspecified: Secondary | ICD-10-CM

## 2023-07-31 ENCOUNTER — Encounter: Payer: Self-pay | Admitting: Internal Medicine

## 2023-08-01 ENCOUNTER — Encounter: Payer: Self-pay | Admitting: Internal Medicine

## 2023-08-01 ENCOUNTER — Other Ambulatory Visit: Payer: Self-pay

## 2023-08-01 ENCOUNTER — Ambulatory Visit: Payer: BC Managed Care – PPO | Admitting: Internal Medicine

## 2023-08-01 VITALS — BP 124/56 | HR 79 | Wt 162.2 lb

## 2023-08-01 DIAGNOSIS — J45991 Cough variant asthma: Secondary | ICD-10-CM

## 2023-08-01 MED ORDER — GABAPENTIN 100 MG PO CAPS: 100.0000 mg | ORAL_CAPSULE | Freq: Every day | ORAL | 2 refills | Status: DC

## 2023-08-01 MED ORDER — GABAPENTIN 100 MG PO CAPS: 100.0000 mg | ORAL_CAPSULE | Freq: Four times a day (QID) | ORAL | 2 refills | Status: DC

## 2023-08-01 NOTE — Patient Instructions (Addendum)
 Change chlorpheniramine  up to every 4 hours during the day but be sure you are taking the medication x 2 pills one hour before bedtime when you having a cough at bedtime  Symbicort  80 Take 2 puffs first thing in am and then another 2 puffs about 12 hours later.    Air supra is only as needed if symbicort  80 not working.  If all else fails start on gabapentin  100 mg up to 4 x daily   Follow up on return from [place] if not doing great.

## 2023-08-01 NOTE — Assessment & Plan Note (Signed)
 Onset around 2014 FENO 12/19/2017  = 12 on singulair   symb 160 2bid but none on day of ov and hfa baseline quite poor  - 12/19/2017  After extensive coaching inhaler device  effectiveness =    50% from a baseline of 0> try symb 80 2bid x 2 week sample   - Allergy  profile 12/19/2017 >  Eos 0.1 /  IgE  44 RAST neg - 06/11/2021    continue symbicort  80 2bid  - 08/06/2021  After extensive coaching inhaler device,  effectiveness =    80%  -  08/06/2021  Gabapentin  100 mg three times daily >>> could not tolerate  - 10/04/2021 rec try off symbicort  then h1 blocker  -  12/01/2022  After extensive coaching inhaler device,  effectiveness =    75% > continue symbicort  80  and add air supra prn  - FENO  12/01/2022  = 7 - Chest and sinus ct ordered 12/01/2022 and changed coreg  to bisoprolol  plus 10 d augmenint  - 12/12/22  Sinus CT nl  - 12/22/22  HRCT nl x increase PA  > note PA  pressures nl 11/2021  - 08/01/2023 flare of cough off gabapentin  (for feet) x one week p exp to dusty bag from attic> better p self rx with max rx for gerd, symb 80 and 1st gen H1 blockers per guidelines  but still with pseudowheeze on exam > add back gabapentin  100 if needed   Since she is better and heading to [place] where her cough typically flares rec no change until return to [place]  then taper off symbicort  80 first and next flare only use air supra prn as I'm not convinced this is asthma on overuse of hfa/ics may actually perpetuate the pseudowheezing / uacs component of her cough   Discussed in detail all the  indications, usual  risks and alternatives  relative to the benefits with patient who agrees to proceed with Rx as outlined.             Each maintenance medication was reviewed in detail including emphasizing most importantly the difference between maintenance and prns and under what circumstances the prns are to be triggered using an action plan format where appropriate.  Total time for H and P, chart review, counseling, reviewing hfa   device(s) and generating customized AVS unique to this office visit / same day charting = 33 min acute eval

## 2023-08-01 NOTE — Progress Notes (Signed)
 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 Charlett. Pt c/o SOB for the past 2 months. She gets winded if she talks to much. She also gets SOB with walking but unable to say how far she walks before she notices a problem.   cough came on abruptly and of March 2018 with rhinitis/nasal discharge/ some sneezing with subjective wheezing   rx pred/delsym/ proair  (not very helpful) changed to symb/singulair  and gradually improved but still harsh coughing fits sporadic pattern        Kouffman Reflux v Neurogenic Cough Differentiator Reflux Comments  Do you awaken from a sound sleep coughing violently?                            With trouble breathing? Yes   Do you have choking episodes when you cannot  Get enough air, gasping for air ?              NO    Do you usually cough when you lie down into  The bed, or when you just lie down to rest ?                          Not really    Do you usually cough after meals or eating?         Worse    Do you cough when (or after) you bend over?    No    GERD SCORE     Kouffman Reflux v Neurogenic Cough Differentiator Neurogenic   Do you more-or-less cough all day long? Sporadic    Does change of temperature make you cough? no   Does laughing or chuckling cause you to cough? no   Do fumes (perfume, automobile fumes, burned  Toast, etc.,)  cause you to cough ?      no   Does speaking, singing, or talking on the phone cause you to cough   ?               No    Neurogenic/Airway score         Unless coughing not sob  Rec symbicort  change to 80 strength and take up to 2 every 12 hours if it's helping  Work on inhaler technique:   First take delsym two tsp every 12 hours and supplement if needed with  tramadol  50 mg up to 1 every 4 hours to suppress the urge to cough at all or even clear your throat.  Prednisone  10 mg take  4 each am x 2 days,   2 each am x 2 days,  1 each am x 2 days and stop (this is to eliminate allergies and inflammation from coughing) Protonix  (  pantoprazole ) Take 30-60 min before first meal of the day and Pepcid  20 mg one bedtime plus chlorpheniramine  4 mg x 2 at bedtime (both available over the counter)  until cough is completely gone for at least a week without the need for cough suppression GERD diet reviewed, bed blocks       05/28/2021 Re-establish /[name] re: daily throat clearing 2014 never completely resolved then recurrent severe cough flare p covid Mid Sep 2022 maint on prilosec 20 mg one before supper   Chief Complaint  Patient presents with   Pulmonary Consult    04/19/21 dx with covid- sore throat and cough- tx with antiviral. Cough has lingered since then and now wheezing- pred helps but symptoms return one she completes med. She has been using her albuterol  inhaler every 4 hours.   Dyspnea:  nl activities no trouble / not aerobically active  Cough: worse when lies down but present daytime also  / min clear mucus  Sleeping: bed is flat/ on side / does disturb sleep with subj wheezing supine   SABA use: not helping much  02: none  Covid status:  vax x 3  Rec Prednisone  10 mg take  4 each am x 2 days,   2 each am x 2 days,  1 each am x 2 days and stop    The key to effective treatment for your cough is eliminating the non-stop cycle of cough you're stuck in First take delsym two tsp every 12  hours and supplement if needed with  tramadol  50 mg up to 1 every 4 hours    Protonix  (pantoprazole ) Take 30-60 min before first meal of the day and Pepcid  20 mg one bedtime plus chlorpheniramine  4 mg x 2 at bedtime (both available over the counter)  until cough is completely gone for at least a week without the need for cough suppression GERD rx If the cough starts back up as you taper the prednisone  or need more albuterol  then start the symbicort  80 Take 2 puffs first thing in am and then another 2 puffs about 12 hours later.  Work on inhaler technique:   06/11/2021  f/u ov/[name] re: uacs vs cough variant asthma  maint on symb 80 2bid (50% technique) not taking gerd rx correctly and has only used 9 tramadol  total in last 14 days Chief Complaint  Patient presents with   Follow-up    Pt states cough with mucous   Dyspnea:  Not limited by breathing from desired activities   Cough:  more day than noct as takes tramadol  at hs and really min mucoid sputum production  Sleeping: does fine once asleep,flat SABA use: not using but thinks it may have helped  02: none  Wheezing bothers her towards bedtime and persisted even on prednisone   Rec Plan A = Automatic = Always=    Symbicort  80 Take 2 puffs first thing in am and then another 2 puffs about 12 hours later.  Work on inhaler technique:   Plan B = Backup (to supplement plan A, not to replace it) for breathing/ wheezing  Only use your albuterol  inhaler as a rescue medication For drainage / throat tickle try take CHLORPHENIRAMINE   4 mg   If A and B aren't working > Prednisone  10 mg take  4 each am x 2 days,   2 each am x 2 days,  1 each am x 2 days and stop  Keep your previous appointment      08/06/2021  f/u ov/[name] re: asthma/uacs/ flare while in [place] exp to Perfume   maint on symbort 80 2bid / 1st gen H1 blockers/ gerd rx/ never took prednisone  as above Stage Manager Complaint  Patient presents with   Follow-up    Just returned home from [place]- c/o  minimal cough and wheezing. Her cough is prod with clear sputum.    Dyspnea:  Not limited by breathing from desired activities   Cough: dry/better p saba/ never really stopped throat clearing p last ov per husband Sleeping: flat bed/ on side no resp cc SABA use:  tid ? If really helps  02: none  Covid status:vax x 4  Rec Prednisone  10 mg take  4 each am x 2 days,   2 each am x 2 days,  1 each am x 2 days and stop  Take delsym two tsp every 12 hours and supplement if needed with  tramadol  50 mg up to 2 every 4 hours to suppress the urge to cough. Once you have eliminated the cough for 3 straight days try reducing the tramadol  first,  then the delsym as tolerated.   Gabapentin  100 mg three times daily     10/04/2021  f/u ov/[name] re: cough variant / uacs  maint on gerd rx/ h1 and symb 80   Chief Complaint  Patient presents with   Follow-up    Cough and wheezing have resolved. Breathing is doing well. No new co's.  Dyspnea:  cleaning neighborhood and walking daily / no aerobics  Cough: gone  Sleeping: fine flat SABA use: none  02: none  Covid status:   vax x 4  Rec Omeprazole  can be resumed twice daily as you were before  Stop pantoprazole  and famotidine   Stop symbicort  1st but resume full dose at rist sign resume immediately for a week  After a full week off symbicort  >>> For drainage / throat tickle AS NEEDED take CHLORPHENIRAMINE   4 mg   Pulmonary follow up is as needed     11/18/2022  Acute  ov/[name]/[name] re: recurrent cough / had maint on h1 hs/ no h2 or ppi or symbicort  and did fine since last ov  then acutely worse since 11/16/22   Chief Complaint  Patient presents with   Acute Visit    Cough, wheezing and SOB since Wednesday.  Dyspnea:  since onset of recurrent cough before that was fine off symbicort  Cough: very congested sounding turing darker yellow/green rx with zpak by pcp 4/18  Assoc   with nasal congestion  started with st/  tickle and self rx with h1 as  rec up to qid chlorpheniramine   Sleep flat bed / 2 pillows ok  SABA use: started symbicort  80 @ 80 2bid at onset  plus  albuterol  tid  02: none - sats as low as 86% reported  Rec For nasal and chest congestion >  Mucinex  D as needed as per bottle >>>   use the flutter valve as much as possible to help bring up mucus  Finish your zpak and call if mucus is not turning clear by 1st of week For cough for any reason omeprazole  20mg   Take 30-60 min before first and last meal of the day until cough is gone  For breathing take albuterol  2 puffs every 4 hours as needed Practice with empty symbicort  device : Work on inhaler technique:   Depomedrol 120 mg IM   PC 11/22/22 I feel better but certainly not a 100%. The other meds ,  Omeprazole , Prednisone , Mucinex  D are still on along with the Symbicort  & Albuterol . I have been using the Flutter too to help bring up the mucus  Rec > return to [facility] with all active meds    12/01/2022  ACUTE / work in  ov/[name] re: recurrent cough maint on symbicort  80 2bid  / brought meds Chief Complaint  Patient presents with   Acute Visit    Persistent cough with clear mucus.Flutter valve helped clear mucus from lungs   Dyspnea:  walking across campus ok but slow er pace than usual  Cough: clear  Sleeping: bed is flat  2 pillows s resp cc  SABA use: 4-6 x per day ? Wasn't I supposed to? 02: none  Rec Plan A = Automatic = Always=    Symbicort  80 Take 2 puffs first thing in am and then another 2 puffs about 12 hours later.  Work on inhaler technique:   Plan B = Backup (to supplement plan A, not to replace it) Only use your air supra  inhaler  up to 2 puffs  every 4 hours as needed  for cough congestion or difficulty Depomedrol 120 mg IM  Stop carvedilol   and replace with Bisoprolol  5 mg one daily  Late add: Augmentin  875 mg take one pill twice daily  X 10 days   - 12/12/22  Sinus CT nl  - 12/22/22  HRCT nl x increase PA  > note PA  pressures nl 11/2021     02/10/2023  f/u ov/[name]/[name] re: uacs vs cough variant asthma  maint on symb 80 and omeprazole  twice daily  ac  Chief Complaint  Patient presents with   Follow-up    States no issues  Dyspnea:  walking across [employer] s do[place]  Cough: gone  on h1 bid  Sleeping: fine now flat bed  SABA use: none  02: none  Rec Symbicort  80  up to 2 puffs every 12 hours as needed  If doing great off symbicort  for a few weeks, ok to stop omeprazole  am dose only for a few weeks then the pm dose but replace this with famotidine  20 mg over the counter for a few weeks and resume full dose of omeprazole  twice daily  Pulmonary follow up is as needed    08/01/2023  acute re-eval  ov/[name]/[name] re: uacs vs cough variant asthma maint on no rx/ did fine for several months until 07/27/23 ? Exp to bag that was in the attic  so immediately symbioct 80 / air supra  and mucinex  D one half twice daily and omeprazole  bid ac and H1 tid/ stopped gabapentin  one week prior to cough  Chief Complaint  Patient presents with   Acute Visit    Recurrent cough   Dyspnea:  none  Cough: tends to be worse at hs then resolves  Sleeping: 15  degrees electric s   resp cc  SABA use: air supra q 4 h  02: none     No obvious day to day or daytime variability or assoc excess/ purulent sputum or mucus plugs or hemoptysis or cp or chest tightness, subjective wheeze or overt sinus or hb symptoms.    Also denies any obvious fluctuation of symptoms with weather or environmental changes or other aggravating or alleviating factors except as outlined above   No unusual exposure hx or h/o childhood pna/ asthma or knowledge of premature birth.  Current Allergies, Complete Past Medical History, Past Surgical History, Family History, and  Social History were reviewed in [employer] record.  ROS  The following are not active complaints unless bolded Hoarseness, sore throat, dysphagia, dental  problems, itching, sneezing,  nasal congestion or discharge of excess mucus or purulent secretions, ear ache,   fever, chills, sweats, unintended wt loss or wt gain, classically pleuritic or exertional cp,  orthopnea pnd or arm/hand swelling  or leg swelling, presyncope, palpitations, abdominal pain, anorexia, nausea, vomiting, diarrhea  or change in bowel habits or change in bladder habits, change in stools or change in urine, dysuria, hematuria,  rash, arthralgias, visual complaints, headache, numbness, weakness or ataxia or problems with walking or coordination,  change in mood or  memory.        Current Meds  Medication Sig   acetaminophen (TYLENOL) 325 MG tablet Take 325 mg by mouth every 4 (four) hours as needed.   Albuterol -Budesonide  (AIRSUPRA ) 90-80 MCG/ACT AERO Inhale 2 puffs into the lungs every 4 (four) hours as needed.   b complex vitamins capsule Take 1 capsule by mouth daily.   bisoprolol  (ZEBETA ) 5 MG tablet Take 1 tablet (5 mg total) by mouth daily.   budesonide -formoterol  (SYMBICORT ) 80-4.5 MCG/ACT inhaler Inhale 2 puffs into the lungs in the morning and at bedtime. SMARTSIG:By Mouth   Calcium  Carbonate-Vitamin D  (CALCIUM -VITAMIN D3 PO) Take 2 tablets by mouth daily.   Cholecalciferol (DIALYVITE VITAMIN D  5000 PO) Take 5,000 Units by mouth daily.   Coenzyme Q10 300 MG CAPS Take by mouth.   doxycycline  (VIBRA -TABS) 100 MG tablet Take 1 tablet (100 mg total) by mouth daily. Beginning 1-2 days pre travel and continue 4 weeks after return  for malaria prophylaxis   ferrous sulfate 325 (65 FE) MG EC tablet Take 325 mg by mouth daily with breakfast.   gabapentin  (NEURONTIN ) 100 MG capsule Take 1 capsule (100 mg total) by mouth at bedtime.   losartan  (COZAAR ) 50 MG tablet Take one-half tablet by mouth 2 times daily   metFORMIN  (GLUCOPHAGE ) 500 MG tablet Take 1 tablet (500 mg total) by mouth 2 (two) times daily with a meal.   Misc Natural Products (TURMERIC CURCUMIN) CAPS Take 1 tablet by  mouth 2 (two) times daily.   omeprazole  (PRILOSEC) 20 MG capsule Take 20 mg by mouth 2 (two) times daily before a meal.   rosuvastatin  (CRESTOR ) 10 MG tablet TAKE ONE TABLET BY MOUTH ONE TIME DAILY                      Objective:   Physical Exam   wts  08/01/2023     162  02/10/2023       163  12/01/2022         158  11/18/2022       161  10/04/2021         159  08/06/2021         161 06/11/2021     155  05/28/2021     157   12/19/17 153 lb (69.4 kg)  12/11/17 157 lb (71.2 kg)  11/23/17 158 lb 12.8 oz (72 kg)    Vital signs reviewed  08/01/2023  - Note at rest 02 sats  93% on RA   General appearance:    pleasant amb Indian female nad    HEENT : Oropharynx  nl      Nasal turbinates clear   NECK :  without  apparent JVD/ palpable Nodes/TM / classic mild pseudowheeze louder on inspiration  LUNGS: no acc muscle use,  Nl contour chest which is clear to A and P bilaterally without cough on insp or exp maneuvers   CV:  RRR  no s3 or murmur or increase in P2, and no edema   ABD:  obese/ soft  and nontender   MS:  Gait nl   ext warm without deformities Or obvious joint restrictions  calf tenderness, cyanosis or clubbing    SKIN: warm and dry without lesions    NEURO:  alert, approp, nl sensorium with  no motor or cerebellar deficits apparent.    .     Assessment:

## 2023-08-06 NOTE — Telephone Encounter (Signed)
 Yes  stop the metformin since the lower dose still caused diarrhea and check back when your return from [place]  before starting a new medication

## 2023-08-07 ENCOUNTER — Encounter: Payer: Self-pay | Admitting: Internal Medicine

## 2023-08-07 ENCOUNTER — Ambulatory Visit: Payer: BC Managed Care – PPO | Admitting: Family Medicine

## 2023-08-07 ENCOUNTER — Encounter: Payer: Self-pay | Admitting: Family Medicine

## 2023-08-07 VITALS — BP 122/82 | HR 74 | Temp 98.4°F | Ht <= 58 in | Wt 160.0 lb

## 2023-08-07 DIAGNOSIS — J310 Chronic rhinitis: Secondary | ICD-10-CM | POA: Diagnosis not present

## 2023-08-07 MED ORDER — AMOXICILLIN-POT CLAVULANATE 875-125 MG PO TABS: 1.0000 | ORAL_TABLET | Freq: Two times a day (BID) | ORAL | 0 refills | Status: DC
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
## 2023-08-08 DIAGNOSIS — D329 Benign neoplasm of meninges, unspecified: Secondary | ICD-10-CM | POA: Diagnosis not present

## 2023-08-08 DIAGNOSIS — Z9889 Other specified postprocedural states: Secondary | ICD-10-CM | POA: Diagnosis not present

## 2023-08-08 DIAGNOSIS — D320 Benign neoplasm of cerebral meninges: Secondary | ICD-10-CM | POA: Diagnosis not present

## 2023-08-08 DIAGNOSIS — Z86011 Personal history of benign neoplasm of the brain: Secondary | ICD-10-CM | POA: Diagnosis not present

## 2023-08-08 DIAGNOSIS — Z09 Encounter for follow-up examination after completed treatment for conditions other than malignant neoplasm: Secondary | ICD-10-CM | POA: Diagnosis not present

## 2023-09-02 ENCOUNTER — Other Ambulatory Visit: Payer: Self-pay | Admitting: Internal Medicine

## 2023-09-02 DIAGNOSIS — J45991 Cough variant asthma: Secondary | ICD-10-CM

## 2023-09-07 DIAGNOSIS — H5203 Hypermetropia, bilateral: Secondary | ICD-10-CM | POA: Diagnosis not present

## 2023-09-07 DIAGNOSIS — H524 Presbyopia: Secondary | ICD-10-CM | POA: Diagnosis not present

## 2023-09-07 DIAGNOSIS — H25013 Cortical age-related cataract, bilateral: Secondary | ICD-10-CM | POA: Diagnosis not present

## 2023-09-07 DIAGNOSIS — H52223 Regular astigmatism, bilateral: Secondary | ICD-10-CM | POA: Diagnosis not present

## 2023-09-07 DIAGNOSIS — H2513 Age-related nuclear cataract, bilateral: Secondary | ICD-10-CM | POA: Diagnosis not present

## 2023-09-11 ENCOUNTER — Telehealth: Payer: Self-pay

## 2023-09-11 DIAGNOSIS — M85852 Other specified disorders of bone density and structure, left thigh: Secondary | ICD-10-CM

## 2023-09-11 DIAGNOSIS — Z780 Asymptomatic menopausal state: Secondary | ICD-10-CM

## 2023-09-11 DIAGNOSIS — Z13820 Encounter for screening for osteoporosis: Secondary | ICD-10-CM

## 2023-09-11 DIAGNOSIS — M85851 Other specified disorders of bone density and structure, right thigh: Secondary | ICD-10-CM

## 2023-09-11 NOTE — Telephone Encounter (Signed)
 Pt LVM in triage line regarding scheduling next DEXA scan. Received recall letter in the mail. Due in 10/2023.

## 2023-09-11 NOTE — Telephone Encounter (Signed)
 Last AEX 11/07/2022-Dr. Lavoie Scheduled w/ [facility] on 11/08/2023. Last DEXA 11/02/2021 (@ [facility])  OK to place order for pt to schedule?

## 2023-09-12 NOTE — Telephone Encounter (Signed)
Per [facility]:  "Okay to order."  Order placed for [facility].  Pt notified and voiced understanding.   Imaging to be f/u on via work queue and encounter closed.

## 2023-09-18 NOTE — Progress Notes (Unsigned)
Subjective:    Patient ID: [name], female   DOB: 10-14-1960   MRN: [id number]    History of Present Illness  63 yo [place] female Interior and spatial designer of dining for [employer] never smoker with tendency to cough toward end of trips to [place] that  last 2-3 weeks worse after travel back to [place] x 3 trips starting around 2014 referred to pulmonary clinic 12/19/2017 by Dr Fabian Sharp with new cough started end of March 2019 s antecedent travel    During the pollen season has noted cough/ wheeze in past but this is different sensation   12/19/2017 [facility]st [facility] office visit/ Rondia Higginbotham   Chief Complaint  Patient presents with   Pulmonary Consult    Referred by Dr. Fabian Sharp. Pt c/o SOB for the past 2 months. She gets winded if she talks to much. She also gets SOB with walking but unable to say how far she walks before she notices a problem.   cough came on abruptly and of March 2018 with rhinitis/nasal discharge/ some sneezing with subjective wheezing   rx pred/delsym/ proair (not very helpful) changed to symb/singulair and gradually improved but still harsh coughing fits sporadic pattern        Kouffman Reflux v Neurogenic Cough Differentiator Reflux Comments  Do you awaken from a sound sleep coughing violently?                            With trouble breathing? Yes   Do you have choking episodes when you cannot  Get enough air, gasping for air ?              NO    Do you usually cough when you lie down into  The bed, or when you just lie down to rest ?                          Not really    Do you usually cough after meals or eating?         Worse    Do you cough when (or after) you bend over?    No    GERD SCORE     Kouffman Reflux v Neurogenic Cough Differentiator Neurogenic   Do you more-or-less cough all day long? Sporadic    Does change of temperature make you cough? no   Does laughing or chuckling cause you to cough? no   Do fumes (perfume, automobile fumes, burned  Toast, etc.,)  cause you to cough ?      no   Does speaking, singing, or talking on the phone cause you to cough   ?               No    Neurogenic/Airway score         Unless coughing not sob  Rec symbicort change to 80 strength and take up to 2 every 12 hours if it's helping  Work on inhaler technique:   First take delsym two tsp every 12 hours and supplement if needed with  tramadol 50 mg up to 1 every 4 hours to suppress the urge to cough at all or even clear your throat.  Prednisone 10 mg take  4 each am x 2 days,   2 each am x 2 days,  1 each am x 2 days and stop (this is to eliminate allergies and inflammation from coughing) Protonix (  pantoprazole) Take 30-60 min before first meal of the day and Pepcid 20 mg one bedtime plus chlorpheniramine 4 mg x 2 at bedtime (both available over the counter)  until cough is completely gone for at least a week without the need for cough suppression GERD diet reviewed, bed blocks       05/28/2021 Re-establish /Lonnie Reth re: daily throat clearing 2014 never completely resolved then recurrent severe cough flare p covid Mid Sep 2022 maint on prilosec 20 mg one before supper   Chief Complaint  Patient presents with   Pulmonary Consult    04/19/21 dx with covid- sore throat and cough- tx with antiviral. Cough has lingered since then and now wheezing- pred helps but symptoms return one she completes med. She has been using her albuterol inhaler every 4 hours.   Dyspnea:  nl activities no trouble / not aerobically active  Cough: worse when lies down but present daytime also  / min clear mucus  Sleeping: bed is flat/ on side / does disturb sleep with subj wheezing supine   SABA use: not helping much  02: none  Covid status:  vax x 3  Rec Prednisone 10 mg take  4 each am x 2 days,   2 each am x 2 days,  1 each am x 2 days and stop    The key to effective treatment for your cough is eliminating the non-stop cycle of cough you're stuck in First take delsym two tsp every 12  hours and supplement if needed with  tramadol 50 mg up to 1 every 4 hours    Protonix (pantoprazole) Take 30-60 min before first meal of the day and Pepcid 20 mg one bedtime plus chlorpheniramine 4 mg x 2 at bedtime (both available over the counter)  until cough is completely gone for at least a week without the need for cough suppression GERD rx If the cough starts back up as you taper the prednisone or need more albuterol then start the symbicort 80 Take 2 puffs first thing in am and then another 2 puffs about 12 hours later.  Work on inhaler technique:   06/11/2021  f/u ov/Palmira Stickle re: uacs vs cough variant asthma  maint on symb 80 2bid (50% technique) not taking gerd rx correctly and has only used 9 tramadol total in last 14 days Chief Complaint  Patient presents with   Follow-up    Pt states cough with mucous   Dyspnea:  Not limited by breathing from desired activities   Cough:  more day than noct as takes tramadol at hs and really min mucoid sputum production  Sleeping: does fine once asleep,flat SABA use: not using but thinks it may have helped  02: none  Wheezing bothers her towards bedtime and persisted even on prednisone  Rec Plan A = Automatic = Always=    Symbicort 80 Take 2 puffs first thing in am and then another 2 puffs about 12 hours later.  Work on inhaler technique:   Plan B = Backup (to supplement plan A, not to replace it) for breathing/ wheezing  Only use your albuterol inhaler as a rescue medication For drainage / throat tickle try take CHLORPHENIRAMINE  4 mg   If A and B aren't working > Prednisone 10 mg take  4 each am x 2 days,   2 each am x 2 days,  1 each am x 2 days and stop  Keep your previous appointment      08/06/2021  f/u ov/Tell Rozelle re: asthma/uacs/ flare while in [place] exp to Perfume   maint on symbort 80 2bid / 1st gen H1 blockers/ gerd rx/ never took prednisone as above Chief Complaint  Patient presents with   Follow-up    Just returned home from [place]- c/o  minimal cough and wheezing. Her cough is prod with clear sputum.    Dyspnea:  Not limited by breathing from desired activities   Cough: dry/better p saba/ never really stopped throat clearing p last ov per husband Sleeping: flat bed/ on side no resp cc SABA use:  tid ? If really helps  02: none  Covid status:vax x 4  Rec Prednisone 10 mg take  4 each am x 2 days,   2 each am x 2 days,  1 each am x 2 days and stop  Take delsym two tsp every 12 hours and supplement if needed with  tramadol 50 mg up to 2 every 4 hours to suppress the urge to cough. Once you have eliminated the cough for 3 straight days try reducing the tramadol first,  then the delsym as tolerated.   Gabapentin 100 mg three times daily     10/04/2021  f/u ov/Nevaan Bunton re: cough variant / uacs  maint on gerd rx/ h1 and symb 80   Chief Complaint  Patient presents with   Follow-up    Cough and wheezing have resolved. Breathing is doing well. No new co's.  Dyspnea:  cleaning neighborhood and walking daily / no aerobics  Cough: gone  Sleeping: fine flat SABA use: none  02: none  Covid status:   vax x 4  Rec Omeprazole can be resumed twice daily as you were before  Stop pantoprazole and famotidine  Stop symbicort 1st but resume full dose at rist sign resume immediately for a week  After a full week off symbicort >>> For drainage / throat tickle AS NEEDED take CHLORPHENIRAMINE  4 mg   Pulmonary follow up is as needed     11/18/2022  Acute  ov/[place] office/Dewitte Vannice re: recurrent cough / had maint on h1 hs/ no h2 or ppi or symbicort and did fine since last ov  then acutely worse since 11/16/22   Chief Complaint  Patient presents with   Acute Visit    Cough, wheezing and SOB since Wednesday.  Dyspnea:  since onset of recurrent cough before that was fine off symbicort Cough: very congested sounding turing darker yellow/green rx with zpak by pcp 4/18  Assoc   with nasal congestion  started with st/  tickle and self rx with h1 as  rec up to qid chlorpheniramine  Sleep flat bed / 2 pillows ok  SABA use: started symbicort 80 @ 80 2bid at onset  plus  albuterol tid  02: none - sats as low as 86% reported  Rec For nasal and chest congestion >  Mucinex D as needed as per bottle >>>   use the flutter valve as much as possible to help bring up mucus  Finish your zpak and call if mucus is not turning clear by 1st of week For cough for any reason omeprazole 20mg   Take 30-60 min before first and last meal of the day until cough is gone  For breathing take albuterol 2 puffs every 4 hours as needed Practice with empty symbicort device : Work on inhaler technique:   Depomedrol 120 mg IM   PC 11/22/22 I feel better but certainly not a 100%. The other meds ,  Omeprazole, Prednisone, Mucinex D are still on along with the Symbicort & Albuterol. I have been using the Flutter too to help bring up the mucus  Rec > return to office with all active meds    12/01/2022  ACUTE / work in  ov/Koraline Phillipson re: recurrent cough maint on symbicort 80 2bid  / brought meds Chief Complaint  Patient presents with   Acute Visit    Persistent cough with clear mucus.Flutter valve helped clear mucus from lungs   Dyspnea:  walking across campus ok but slow er pace than usual  Cough: clear mucus  Sleeping: bed is flat  2 pillows s resp cc  SABA use: 4-6 x per day ? Wasn't I supposed to?" 02: none  Rec Plan A = Automatic = Always=    Symbicort 80 Take 2 puffs first thing in am and then another 2 puffs about 12 hours later.  Work on inhaler technique:   Plan B = Backup (to supplement plan A, not to replace it) Only use your air supra  inhaler  up to 2 puffs  every 4 hours as needed  for cough congestion or difficulty Depomedrol 120 mg IM  Stop carvedilol  and replace with Bisoprolol 5 mg one daily  Late add: Augmentin 875 mg take one pill twice daily  X 10 days   - 12/12/22  Sinus CT nl  - 12/22/22  HRCT nl x increase PA  > note PA  pressures nl 11/2021     02/10/2023  f/u ov/[place] office/Anna Livers re: uacs vs cough variant asthma  maint on symb 80 and omeprazole twice daily  ac  Chief Complaint  Patient presents with   Follow-up    States no issues  Dyspnea:  walking across [employer] college s doe  Cough: gone  on h1 bid  Sleeping: fine now flat bed  SABA use: none  02: none  Rec Symbicort 80  up to 2 puffs every 12 hours as needed  If doing great off symbicort for a few weeks, ok to stop omeprazole am dose only for a few weeks then the pm dose but replace this with famotidine 20 mg over the counter for a few weeks and resume full dose of omeprazole twice daily  Pulmonary follow up is as needed    08/01/2023  acute re-eval  ov/[place] office/Dene Landsberg re: uacs vs cough variant asthma maint on no rx/ did fine for several months until 07/27/23 ? Exp to bag that was in the attic  so immediately symbioct 80 / air supra  and mucinex D one half twice daily and omeprazole bid ac and H1 tid/ stopped gabapentin one week prior to cough  Chief Complaint  Patient presents with   Acute Visit    Recurrent cough   Dyspnea:  none  Cough: tends to be worse at hs then resolves  Sleeping: 15  degrees electric s   resp cc  SABA use: air supra q 4 h  02: none  Rec Change chlorpheniramine up to every 4 hours during the day but be sure you are taking the medication x 2 pills one hour before bedtime when you having a cough at bedtime Symbicort 80 Take 2 puffs first thing in am and then another 2 puffs about 12 hours later.  Air supra is only as needed if symbicort 80 not working. If all else fails start on gabapentin 100 mg up to 4 x daily   Follow up on return from [place] if not  doing great.       09/20/2023  Virtual f/u  ov/Oneal Schoenberger re: cough flared maint 2 days p returned from [place]  No chief complaint on file. Dyspnea:  none  Cough: mostly dry cough   Sleeping: prop up 30-45 s resp cc  SABA use: not supra  02: none   No obvious day to day or  daytime variability or assoc excess/ purulent sputum or mucus plugs or hemoptysis or cp or chest tightness, subjective wheeze or overt sinus or hb symptoms.    Also denies any obvious fluctuation of symptoms with weather or environmental changes or other aggravating or alleviating factors except as outlined above   No unusual exposure hx or h/o childhood pna/ asthma or knowledge of premature birth.  Current Allergies, Complete Past Medical History, Past Surgical History, Family History, and Social History were reviewed in [employer] record.  ROS  The following are not active complaints unless bolded Hoarseness, sore throat, dysphagia, dental problems, itching, sneezing,  nasal congestion or discharge of excess mucus or purulent secretions, ear ache,   fever, chills, sweats, unintended wt loss or wt gain, classically pleuritic or exertional cp,  orthopnea pnd or arm/hand swelling  or leg swelling, presyncope, palpitations, abdominal pain, anorexia, nausea, vomiting, diarrhea  or change in bowel habits or change in bladder habits, change in stools or change in urine, dysuria, hematuria,  rash, arthralgias, visual complaints, headache, numbness, weakness or ataxia or problems with walking or coordination,  change in mood or  memory.        No outpatient medications have been marked as taking for the 09/20/23 encounter (Appointment) with Nyoka Cowden, MD.                      Objective:   Physical Exam   wts  09/20/2023       virtual   08/01/2023     162  02/10/2023       163  12/01/2022         158  11/18/2022       161  10/04/2021         159  08/06/2021         161 06/11/2021     155  05/28/2021     157   12/19/17 153 lb (69.4 kg)  12/11/17 157 lb (71.2 kg)  11/23/17 158 lb 12.8 oz (72 kg)     General appearance:    pleasant [place] female, nl voice texture s spont cough        .     Assessment:

## 2023-09-20 ENCOUNTER — Encounter: Payer: Self-pay | Admitting: Internal Medicine

## 2023-09-20 ENCOUNTER — Telehealth (INDEPENDENT_AMBULATORY_CARE_PROVIDER_SITE_OTHER): Payer: BC Managed Care – PPO | Admitting: Internal Medicine

## 2023-09-20 DIAGNOSIS — J45991 Cough variant asthma: Secondary | ICD-10-CM | POA: Diagnosis not present

## 2023-09-20 NOTE — Patient Instructions (Signed)
Gapentin 100 mg at bedtime for one week and if still coughing after a week add one pill each week to max of 100 mg 4xd   - goal is not cough at all for a week   Follow up is as needed

## 2023-09-26 ENCOUNTER — Inpatient Hospital Stay (HOSPITAL_BASED_OUTPATIENT_CLINIC_OR_DEPARTMENT_OTHER): Admission: RE | Admit: 2023-09-26 | Payer: BC Managed Care – PPO | Source: Ambulatory Visit

## 2023-10-04 ENCOUNTER — Ambulatory Visit
Admission: RE | Admit: 2023-10-04 | Discharge: 2023-10-04 | Disposition: A | Discharge status: Home / Self Care | Payer: BC Managed Care – PPO | Source: Ambulatory Visit | Attending: Internal Medicine | Admitting: Internal Medicine

## 2023-10-04 DIAGNOSIS — Z1231 Encounter for screening mammogram for malignant neoplasm of breast: Secondary | ICD-10-CM | POA: Diagnosis not present

## 2023-10-04 DIAGNOSIS — Z0000 Encounter for general adult medical examination without abnormal findings: Secondary | ICD-10-CM

## 2023-10-09 ENCOUNTER — Encounter: Payer: Self-pay | Admitting: Internal Medicine

## 2023-10-25 ENCOUNTER — Other Ambulatory Visit: Payer: Self-pay | Admitting: Interventional Cardiology

## 2023-11-01 ENCOUNTER — Ambulatory Visit: Payer: BC Managed Care – PPO | Attending: Cardiology | Admitting: Cardiology

## 2023-11-01 ENCOUNTER — Encounter: Payer: Self-pay | Admitting: Cardiology

## 2023-11-01 VITALS — BP 128/72 | HR 72 | Ht <= 58 in | Wt 161.0 lb

## 2023-11-01 DIAGNOSIS — I10 Essential (primary) hypertension: Secondary | ICD-10-CM

## 2023-11-01 DIAGNOSIS — I700 Atherosclerosis of aorta: Secondary | ICD-10-CM

## 2023-11-01 DIAGNOSIS — I2510 Atherosclerotic heart disease of native coronary artery without angina pectoris: Secondary | ICD-10-CM | POA: Diagnosis not present
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
## 2023-11-01 NOTE — Progress Notes (Signed)
 Cardiology Office Note:  .   Date:  11/01/2023  ID:  [name], DOB [birth year], MRN [id number] PCP: Madelin Headings, MD  [facility] Providers Cardiologist:  Lance Muss, MD    History of Present Illness: .   [name] is a 63 y.o. female Discussed the use of AI scribe software for clinical note transcription with the patient, who gave verbal consent to proceed.  History of Present Illness [name] is a 63 year old female with coronary artery calcification and aortic atherosclerosis who presents for follow-up. She is accompanied by her husband, Simonne Maffucci.  A CT scan in May 2024 revealed calcified plaque in the coronary arteries and aortic plaque. She is currently on Crestor 10 mg once a day for hyperlipidemia. Her LDL has been 49 in the past, and her cholesterol numbers have been described as 'wonderful'.  She has a history of diabetes with a hemoglobin A1c of 6.9 in the past. She is currently not taking metformin due to stomach upset. She follows a Mediterranean diet and is working on her diabetes management. Her most recent hemoglobin A1c was 6.5.  She underwent an exercise stress test in 2023, which showed excellent results with no ischemia, although she had poor exercise tolerance at that time. She is currently on bisoprolol 5 mg daily and losartan 25 mg twice a day.  No current symptoms such as chest pain, shortness of breath, or fainting episodes. She is not taking aspirin due to a reported allergy, which was informed by her mother. She has never taken aspirin and is considering testing for this allergy.    Studies Reviewed: Marland Kitchen   EKG Interpretation Date/Time:  Wednesday November 01 2023 08:45:13 EDT Ventricular Rate:  72 PR Interval:  166 QRS Duration:  86 QT Interval:  410 QTC Calculation: 448 R Axis:   63  Text Interpretation: Normal sinus rhythm Normal ECG When compared with ECG of 23-Jul-2009 09:17, No significant change was found Confirmed  by Donato Schultz ([id number]) on 11/01/2023 9:11:20 AM    Results LABS LDL: 49 Hemoglobin A1c: 6.9  RADIOLOGY High resolution chest CT: Calcified plaque in aorta (12/12/2022)  DIAGNOSTIC Echocardiogram: Normal left ventricular ejection fraction (2013) Risk Assessment/Calculations:            Physical Exam:   VS:  BP 128/72   Pulse 72   Ht 4\' 10"  (1.473 m)   Wt 161 lb (73 kg)   SpO2 95%   BMI 33.65 kg/m    Wt Readings from Last 3 Encounters:  11/01/23 161 lb (73 kg)  08/07/23 160 lb (72.6 kg)  08/01/23 162 lb 3.2 oz (73.6 kg)    GEN: Well nourished, well developed in no acute distress NECK: No JVD; No carotid bruits CARDIAC: RRR, no murmurs, no rubs, no gallops RESPIRATORY:  Clear to auscultation without rales, wheezing or rhonchi  ABDOMEN: Soft, non-tender, non-distended EXTREMITIES:  No edema; No deformity   ASSESSMENT AND PLAN: .    Assessment and Plan Assessment & Plan Coronary artery calcification with aortic atherosclerosis Coronary artery calcification and aortic atherosclerosis were identified through a CT scan. The calcified plaque is well-managed, with no evidence of arterial obstruction. Rosuvastatin is prescribed to stabilize the plaque and maintain low LDL levels, currently at 65 mg/dL. Emphasis is placed on a healthy lifestyle, including a Mediterranean diet and regular exercise, to prevent plaque progression. The potential benefits of low-dose aspirin for cardiovascular health were discussed, but she reports an allergy. Testing for  aspirin allergy was suggested to explore its use safely. - Continue rosuvastatin 10 mg daily - Maintain Mediterranean diet and regular exercise - Discuss aspirin allergy testing with primary care physician  Type 2 Diabetes Mellitus Type 2 diabetes mellitus with a recent hemoglobin A1c of 6.5%. She discontinued metformin due to gastrointestinal side effects. Metabolic syndrome and insulin resistance were explained as contributing factors  to the elevated A1c. Follow-up with her primary care physician for diabetes management is advised. - Follow up with primary care physician for diabetes management  Follow-up She is asymptomatic and comfortable with discharge from the cardiology clinic, with the understanding that she can return if needed. Advised to continue regular follow-ups with her primary care physician and to contact the cardiology clinic if any issues arise. - Graduate from cardiology clinic with PRN follow-up - Continue regular follow-ups with primary care physician          Signed, Donato Schultz, MD

## 2023-11-01 NOTE — Patient Instructions (Signed)
 Medication Instructions:  Your physician recommends that you continue on your current medications as directed. Please refer to the Current Medication list given to you today.  *If you need a refill on your cardiac medications before your next appointment, please call your pharmacy*  Lab Work: NONE If you have labs (blood work) drawn today and your tests are completely normal, you will receive your results only by: MyChart Message (if you have MyChart) OR A paper copy in the mail If you have any lab test that is abnormal or we need to change your treatment, we will call you to review the results.  Testing/Procedures: NONE  Follow-Up: At [facility] you and your health needs are our priority.  As part of our continuing mission to provide you with exceptional heart care, our providers are all part of one team.  This team includes your primary Cardiologist (physician) and Advanced Practice Providers or APPs (Physician Assistants and Nurse Practitioners) who all work together to provide you with the care you need, when you need it.  Your next appointment:   AS NEEDED  Provider:   DR. Anne Fu  We recommend signing up for the patient portal called "MyChart".  Sign up information is provided on this After Visit Summary.  MyChart is used to connect with patients for Virtual Visits (Telemedicine).  Patients are able to view lab/test results, encounter notes, upcoming appointments, etc.  Non-urgent messages can be sent to your provider as well.   To learn more about what you can do with MyChart, go to [email or web address].   Other Instructions       1st Floor: - Lobby - Registration  - Pharmacy  - Lab - Cafe  2nd Floor: - PV Lab - Diagnostic Testing (echo, CT, nuclear med)  3rd Floor: - Vacant  4th Floor: - TCTS (cardiothoracic surgery) - AFib Clinic - Structural Heart Clinic - Vascular Surgery  - Vascular Ultrasound  5th Floor: - HeartCare Cardiology (general  and EP) - Clinical Pharmacy for coumadin, hypertension, lipid, weight-loss medications, and med management appointments    Valet parking services will be available as well.

## 2023-11-02 ENCOUNTER — Ambulatory Visit: Payer: BC Managed Care – PPO | Admitting: Internal Medicine

## 2023-11-02 ENCOUNTER — Encounter: Payer: Self-pay | Admitting: Internal Medicine

## 2023-11-02 VITALS — BP 126/66 | HR 68 | Temp 98.2°F | Ht <= 58 in | Wt 161.8 lb

## 2023-11-02 DIAGNOSIS — R739 Hyperglycemia, unspecified: Secondary | ICD-10-CM | POA: Diagnosis not present

## 2023-11-02 DIAGNOSIS — Z79899 Other long term (current) drug therapy: Secondary | ICD-10-CM | POA: Diagnosis not present

## 2023-11-02 DIAGNOSIS — I10 Essential (primary) hypertension: Secondary | ICD-10-CM | POA: Diagnosis not present

## 2023-11-02 LAB — POCT GLYCOSYLATED HEMOGLOBIN (HGB A1C): Hemoglobin A1C: 6.4 % — AB (ref 4.0–5.6)

## 2023-11-02 LAB — MICROALBUMIN / CREATININE URINE RATIO
Creatinine,U: 82.5 mg/dL
Microalb Creat Ratio: UNDETERMINED mg/g (ref 0.0–30.0)
Microalb, Ur: <0.7 mg/dL

## 2023-11-02 NOTE — Patient Instructions (Addendum)
 Continue healthy eating.    6 months cpe ( welcome to medicare cpe )   labs as appropriate at that time.

## 2023-11-05 ENCOUNTER — Encounter: Payer: Self-pay | Admitting: Internal Medicine

## 2023-11-05 NOTE — Progress Notes (Signed)
 Urine for protein is normal  reassuring

## 2023-11-07 ENCOUNTER — Ambulatory Visit (HOSPITAL_BASED_OUTPATIENT_CLINIC_OR_DEPARTMENT_OTHER)
Admission: RE | Admit: 2023-11-07 | Discharge: 2023-11-07 | Disposition: A | Discharge status: Home / Self Care | Payer: BC Managed Care – PPO | Source: Ambulatory Visit | Attending: Obstetrics and Gynecology | Admitting: Obstetrics and Gynecology

## 2023-11-07 DIAGNOSIS — Z780 Asymptomatic menopausal state: Secondary | ICD-10-CM | POA: Insufficient documentation

## 2023-11-07 DIAGNOSIS — M85852 Other specified disorders of bone density and structure, left thigh: Secondary | ICD-10-CM | POA: Insufficient documentation

## 2023-11-07 DIAGNOSIS — Z13820 Encounter for screening for osteoporosis: Secondary | ICD-10-CM | POA: Insufficient documentation

## 2023-11-08 ENCOUNTER — Encounter: Payer: Self-pay | Admitting: Obstetrics and Gynecology

## 2023-11-08 ENCOUNTER — Ambulatory Visit (INDEPENDENT_AMBULATORY_CARE_PROVIDER_SITE_OTHER): Payer: BC Managed Care – PPO | Admitting: Obstetrics and Gynecology

## 2023-11-08 VITALS — BP 112/64 | HR 74 | Ht 59.0 in | Wt 160.0 lb

## 2023-11-08 DIAGNOSIS — Z01419 Encounter for gynecological examination (general) (routine) without abnormal findings: Secondary | ICD-10-CM | POA: Diagnosis not present

## 2023-11-08 DIAGNOSIS — Z1331 Encounter for screening for depression: Secondary | ICD-10-CM

## 2023-11-08 DIAGNOSIS — M85851 Other specified disorders of bone density and structure, right thigh: Secondary | ICD-10-CM

## 2023-11-08 NOTE — Assessment & Plan Note (Signed)
 Cervical cancer screening performed according to ASCCP guidelines. Encouraged annual mammogram screening Colonoscopy UTD DXA UTD Labs and immunizations with her primary Encouraged safe sexual practices as indicated Encouraged healthy lifestyle practices with diet and exercise For patients under 50-65yo, I recommend 1200mg  calcium daily and 600IU of vitamin D daily.
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
## 2023-11-08 NOTE — Assessment & Plan Note (Signed)
 Normal DXA 2025 Continue vitamin D+Calcium Encouraged weight based exercise DXA in 5 years

## 2023-11-08 NOTE — Patient Instructions (Signed)

## 2023-11-15 ENCOUNTER — Encounter: Payer: Self-pay | Admitting: Internal Medicine

## 2023-11-15 ENCOUNTER — Other Ambulatory Visit: Payer: Self-pay

## 2023-11-15 DIAGNOSIS — I10 Essential (primary) hypertension: Secondary | ICD-10-CM

## 2023-11-15 MED ORDER — BISOPROLOL FUMARATE 5 MG PO TABS
5.0000 mg | ORAL_TABLET | Freq: Every day | ORAL | 4 refills | Status: DC
Start: 2023-11-15 — End: 2023-11-20

## 2023-11-18 ENCOUNTER — Other Ambulatory Visit: Payer: Self-pay | Admitting: Internal Medicine

## 2023-11-18 DIAGNOSIS — I10 Essential (primary) hypertension: Secondary | ICD-10-CM

## 2023-11-20 MED ORDER — BISOPROLOL FUMARATE 5 MG PO TABS
5.0000 mg | ORAL_TABLET | Freq: Every day | ORAL | 4 refills | Status: AC
Start: 2023-11-20 — End: ?

## 2023-11-20 NOTE — Telephone Encounter (Signed)
 Ok to change drugstores for the bisoprrolol refills

## 2023-11-25 ENCOUNTER — Encounter: Payer: Self-pay | Admitting: Internal Medicine

## 2023-11-25 DIAGNOSIS — Z79899 Other long term (current) drug therapy: Secondary | ICD-10-CM

## 2023-11-25 DIAGNOSIS — Z0000 Encounter for general adult medical examination without abnormal findings: Secondary | ICD-10-CM

## 2023-11-25 DIAGNOSIS — E7800 Pure hypercholesterolemia, unspecified: Secondary | ICD-10-CM

## 2023-11-25 DIAGNOSIS — I10 Essential (primary) hypertension: Secondary | ICD-10-CM

## 2023-11-27 MED ORDER — LOSARTAN POTASSIUM 50 MG PO TABS
ORAL_TABLET | ORAL | 3 refills | Status: AC
Start: 2023-11-27 — End: ?

## 2023-12-15 ENCOUNTER — Encounter: Payer: Self-pay | Admitting: Cardiology

## 2023-12-15 ENCOUNTER — Encounter: Payer: Self-pay | Admitting: Internal Medicine

## 2023-12-20 ENCOUNTER — Telehealth: Payer: Self-pay

## 2023-12-20 MED ORDER — ROSUVASTATIN CALCIUM 10 MG PO TABS: 10.0000 mg | ORAL_TABLET | Freq: Every day | ORAL | 2 refills | Status: AC

## 2023-12-20 NOTE — Telephone Encounter (Signed)
 Copied from CRM [id number]. Topic: Clinical - Prescription Issue >> Dec 20, 2023  9:10 AM [place] A wrote: Reason for CRM: Patient would like for call back regarding the message she sent on 12/15/23

## 2023-12-21 DIAGNOSIS — K080 Exfoliation of teeth due to systemic causes: Secondary | ICD-10-CM | POA: Diagnosis not present

## 2023-12-21 NOTE — Telephone Encounter (Signed)
 Message was sent back to pt's mychart messenge. Attempted to reach pt. Left a detail message that it was sent to [facility] message but if has question to give us  a call.

## 2024-01-18 ENCOUNTER — Encounter (HOSPITAL_BASED_OUTPATIENT_CLINIC_OR_DEPARTMENT_OTHER): Payer: Self-pay | Admitting: Adult Health

## 2024-01-18 ENCOUNTER — Ambulatory Visit (HOSPITAL_BASED_OUTPATIENT_CLINIC_OR_DEPARTMENT_OTHER): Admitting: Adult Health

## 2024-01-18 VITALS — BP 126/71 | HR 76 | Ht 59.0 in | Wt 162.0 lb

## 2024-01-18 DIAGNOSIS — J4521 Mild intermittent asthma with (acute) exacerbation: Secondary | ICD-10-CM

## 2024-01-18 MED ORDER — PREDNISONE 10 MG PO TABS: ORAL_TABLET | ORAL | 0 refills | Status: DC

## 2024-01-18 MED ORDER — OMEPRAZOLE 20 MG PO CPDR: 20.0000 mg | DELAYED_RELEASE_CAPSULE | Freq: Two times a day (BID) | ORAL | 5 refills | Status: AC | PRN

## 2024-01-18 MED ORDER — BUDESONIDE-FORMOTEROL FUMARATE 160-4.5 MCG/ACT IN AERO: 2.0000 | INHALATION_SPRAY | Freq: Two times a day (BID) | RESPIRATORY_TRACT | 6 refills | Status: DC

## 2024-01-18 MED ORDER — AZITHROMYCIN 250 MG PO TABS: ORAL_TABLET | ORAL | 0 refills | Status: DC
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
## 2024-01-18 NOTE — Patient Instructions (Addendum)
 Restart Symbicort  2 puffs Twice daily, rinse after use.  Airsupra  2 puffs every 6hr as needed  Omeprazole  Twice daily   Mucinex  Twice daily  As needed  cough/congestion  Delsym 2 tsp Twice daily  As needed  cough  Zpack to have on hold if symptoms worsen with discolored mucus  Prednisone  taper to have on hold if symptoms worsen with wheezing/cough.  Follow up with Dr. Waymond Hailey 3 months and As needed   Please contact office for sooner follow up if symptoms do not improve or worsen or seek emergency care

## 2024-01-19 ENCOUNTER — Ambulatory Visit: Admitting: Family Medicine

## 2024-02-28 ENCOUNTER — Ambulatory Visit: Admitting: Internal Medicine

## 2024-05-14 ENCOUNTER — Ambulatory Visit: Admitting: Internal Medicine

## 2024-05-14 ENCOUNTER — Encounter: Payer: Self-pay | Admitting: Internal Medicine

## 2024-05-14 VITALS — BP 118/72 | HR 69 | Temp 97.7°F | Ht <= 58 in | Wt 165.4 lb

## 2024-05-14 DIAGNOSIS — R635 Abnormal weight gain: Secondary | ICD-10-CM | POA: Diagnosis not present

## 2024-05-14 DIAGNOSIS — Z23 Encounter for immunization: Secondary | ICD-10-CM

## 2024-05-14 DIAGNOSIS — R739 Hyperglycemia, unspecified: Secondary | ICD-10-CM

## 2024-05-14 DIAGNOSIS — Z79899 Other long term (current) drug therapy: Secondary | ICD-10-CM | POA: Diagnosis not present

## 2024-05-14 DIAGNOSIS — Z0000 Encounter for general adult medical examination without abnormal findings: Secondary | ICD-10-CM

## 2024-05-14 DIAGNOSIS — D320 Benign neoplasm of cerebral meninges: Secondary | ICD-10-CM | POA: Diagnosis not present

## 2024-05-14 DIAGNOSIS — E7800 Pure hypercholesterolemia, unspecified: Secondary | ICD-10-CM

## 2024-05-14 DIAGNOSIS — I10 Essential (primary) hypertension: Secondary | ICD-10-CM | POA: Diagnosis not present

## 2024-05-14 LAB — CBC WITH DIFFERENTIAL/PLATELET
Basophils Absolute: 0 K/uL (ref 0.0–0.1)
Basophils Relative: 0.5 % (ref 0.0–3.0)
Eosinophils Absolute: 0.3 K/uL (ref 0.0–0.7)
Eosinophils Relative: 3.5 % (ref 0.0–5.0)
HCT: 43.1 % (ref 36.0–46.0)
Hemoglobin: 14 g/dL (ref 12.0–15.0)
Lymphocytes Relative: 30.4 % (ref 12.0–46.0)
Lymphs Abs: 2.4 K/uL (ref 0.7–4.0)
MCHC: 32.5 g/dL (ref 30.0–36.0)
MCV: 88.3 fl (ref 78.0–100.0)
Monocytes Absolute: 0.7 K/uL (ref 0.1–1.0)
Monocytes Relative: 8.3 % (ref 3.0–12.0)
Neutro Abs: 4.5 K/uL (ref 1.4–7.7)
Neutrophils Relative %: 57.3 % (ref 43.0–77.0)
Platelets: 270 K/uL (ref 150.0–400.0)
RBC: 4.89 Mil/uL (ref 3.87–5.11)
RDW: 13.5 % (ref 11.5–15.5)
WBC: 7.9 K/uL (ref 4.0–10.5)

## 2024-05-14 LAB — BASIC METABOLIC PANEL WITH GFR
BUN: 11 mg/dL (ref 6–23)
CO2: 32 meq/L (ref 19–32)
Calcium: 9.2 mg/dL (ref 8.4–10.5)
Chloride: 101 meq/L (ref 96–112)
Creatinine, Ser: 0.65 mg/dL (ref 0.40–1.20)
GFR: 92.31 mL/min (ref >60.00)
Glucose, Bld: 114 mg/dL — ABNORMAL HIGH (ref 70–99)
Potassium: 4.5 meq/L (ref 3.5–5.1)
Sodium: 141 meq/L (ref 135–145)

## 2024-05-14 LAB — LIPID PANEL
Cholesterol: 151 mg/dL (ref 0–200)
HDL: 68.5 mg/dL (ref >39.00)
LDL Cholesterol: 62 mg/dL (ref 0–99)
NonHDL: 82.96
Total CHOL/HDL Ratio: 2
Triglycerides: 103 mg/dL (ref 0.0–149.0)
VLDL: 20.6 mg/dL (ref 0.0–40.0)

## 2024-05-14 LAB — MICROALBUMIN / CREATININE URINE RATIO
Creatinine,U: 58.6 mg/dL
Microalb Creat Ratio: 13.9 mg/g (ref 0.0–30.0)
Microalb, Ur: 0.8 mg/dL (ref 0.0–1.9)

## 2024-05-14 LAB — HEPATIC FUNCTION PANEL
ALT: 16 U/L (ref 0–35)
AST: 20 U/L (ref 0–37)
Albumin: 4.2 g/dL (ref 3.5–5.2)
Alkaline Phosphatase: 73 U/L (ref 39–117)
Bilirubin, Direct: 0.1 mg/dL (ref 0.0–0.3)
Total Bilirubin: 0.5 mg/dL (ref 0.2–1.2)
Total Protein: 7.3 g/dL (ref 6.0–8.3)

## 2024-05-14 LAB — HEMOGLOBIN A1C: Hgb A1c MFr Bld: 7 % — ABNORMAL HIGH (ref 4.6–6.5)

## 2024-05-14 LAB — TSH: TSH: 2.03 u[IU]/mL (ref 0.35–5.50)

## 2024-05-14 LAB — T4, FREE: Free T4: 0.84 ng/dL (ref 0.60–1.60)
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
## 2024-05-14 NOTE — Patient Instructions (Signed)
 Good to see  you .  Exam is good  Not sure why weight came up buyt checking on metabolic .  Flu vaccine and prevnar  20 today .   Avoid limit simple processed foods and carbs .   Add  muscle exercises .  Plan fu depending.   Wt Readings from Last 3 Encounters:  05/14/24 165 lb 6.4 oz (75 kg)  01/18/24 162 lb (73.5 kg)  11/08/23 160 lb (72.6 kg)

## 2024-05-15 ENCOUNTER — Ambulatory Visit: Payer: Self-pay | Admitting: Internal Medicine

## 2024-05-15 NOTE — Progress Notes (Signed)
 Blood sugar now  back in diabetes range . Rest of labs  ok stable  So  advise  cut out   processed carbs as we discussed and plan fu   consideration of adding meds such as metfomin 500 mg xr  1 per day disp 90 and plan fu visit to discuss   in about 1 month  can be virtual

## 2024-05-16 ENCOUNTER — Other Ambulatory Visit: Payer: Self-pay | Admitting: Internal Medicine

## 2024-05-16 DIAGNOSIS — I10 Essential (primary) hypertension: Secondary | ICD-10-CM

## 2024-05-17 MED ORDER — METFORMIN HCL ER 500 MG PO TB24: 500.0000 mg | ORAL_TABLET | Freq: Every day | ORAL | 0 refills | Status: DC

## 2024-05-27 ENCOUNTER — Encounter: Payer: Self-pay | Admitting: Internal Medicine

## 2024-05-27 NOTE — Telephone Encounter (Signed)
**Note De-identified  [place] Obfuscation** Please advise 

## 2024-06-03 ENCOUNTER — Encounter: Payer: Self-pay | Admitting: Internal Medicine

## 2024-06-13 NOTE — Telephone Encounter (Signed)
 I usually dont see that problem with  being on medication such a short time . Sometimes med can interfere with b12 absorption and cause a problem  elevated sugars can cause the problem also . If gets  bad you try off med but  would prefer you stay on it until we talk in a few weeks

## 2024-06-20 ENCOUNTER — Ambulatory Visit: Payer: Self-pay | Admitting: Internal Medicine

## 2024-06-20 ENCOUNTER — Other Ambulatory Visit

## 2024-06-20 ENCOUNTER — Telehealth: Admitting: Internal Medicine

## 2024-06-20 ENCOUNTER — Encounter: Payer: Self-pay | Admitting: Internal Medicine

## 2024-06-20 VITALS — Ht <= 58 in | Wt 162.1 lb

## 2024-06-20 DIAGNOSIS — Z7984 Long term (current) use of oral hypoglycemic drugs: Secondary | ICD-10-CM

## 2024-06-20 DIAGNOSIS — R739 Hyperglycemia, unspecified: Secondary | ICD-10-CM

## 2024-06-20 DIAGNOSIS — E118 Type 2 diabetes mellitus with unspecified complications: Secondary | ICD-10-CM | POA: Diagnosis not present

## 2024-06-20 DIAGNOSIS — Z79899 Other long term (current) drug therapy: Secondary | ICD-10-CM

## 2024-06-20 DIAGNOSIS — R208 Other disturbances of skin sensation: Secondary | ICD-10-CM | POA: Diagnosis not present

## 2024-06-20 DIAGNOSIS — R7309 Other abnormal glucose: Secondary | ICD-10-CM

## 2024-06-20 LAB — VITAMIN B12: Vitamin B-12: 747 pg/mL (ref 211–911)

## 2024-06-20 LAB — HEMOGLOBIN A1C: Hgb A1c MFr Bld: 6.6 % — ABNORMAL HIGH (ref 4.6–6.5)
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
## 2024-06-20 NOTE — Progress Notes (Signed)
 B12 in good range  but ok to add oral supplement   doubt cause of the sx.  May be the diabetes .  A1c I repeated is  coming down .  No new advice

## 2024-07-01 ENCOUNTER — Encounter: Payer: Self-pay | Admitting: Internal Medicine

## 2024-07-01 ENCOUNTER — Other Ambulatory Visit: Payer: Self-pay | Admitting: Adult Health

## 2024-07-01 ENCOUNTER — Other Ambulatory Visit: Payer: Self-pay | Admitting: *Deleted

## 2024-07-01 MED ORDER — BUDESONIDE-FORMOTEROL FUMARATE 160-4.5 MCG/ACT IN AERO: 2.0000 | INHALATION_SPRAY | Freq: Two times a day (BID) | RESPIRATORY_TRACT | 1 refills | Status: AC

## 2024-07-17 ENCOUNTER — Other Ambulatory Visit: Payer: Self-pay | Admitting: Internal Medicine

## 2024-07-19 ENCOUNTER — Other Ambulatory Visit: Payer: Self-pay | Admitting: Internal Medicine

## 2024-07-19 DIAGNOSIS — Z1231 Encounter for screening mammogram for malignant neoplasm of breast: Secondary | ICD-10-CM

## 2024-10-08 ENCOUNTER — Ambulatory Visit

## 2024-10-22 ENCOUNTER — Ambulatory Visit: Admitting: Internal Medicine

## 2024-11-08 ENCOUNTER — Encounter: Admitting: Obstetrics and Gynecology
